# Patient Record
Sex: Female | Born: 1950 | Race: White | Hispanic: No | Marital: Married | Smoking: Never smoker
Health system: Southern US, Community
[De-identification: ages and names within clinical notes are randomized; demographics above are authoritative.]

## PROBLEM LIST (undated history)

## (undated) DIAGNOSIS — I639 Cerebral infarction, unspecified: Secondary | ICD-10-CM

## (undated) DIAGNOSIS — E785 Hyperlipidemia, unspecified: Secondary | ICD-10-CM

## (undated) DIAGNOSIS — I10 Essential (primary) hypertension: Secondary | ICD-10-CM

## (undated) HISTORY — PX: BLADDER SURGERY: SHX569

## (undated) HISTORY — PX: TUBAL LIGATION: SHX77

## (undated) HISTORY — DX: Cerebral infarction, unspecified: I63.9

## (undated) HISTORY — DX: Hyperlipidemia, unspecified: E78.5

## (undated) HISTORY — PX: KNEE ARTHROSCOPY: SUR90

---

## 2005-08-01 ENCOUNTER — Encounter: Admission: RE | Admit: 2005-08-01 | Discharge: 2005-08-01 | Payer: Self-pay | Admitting: Obstetrics and Gynecology

## 2005-09-10 ENCOUNTER — Ambulatory Visit (HOSPITAL_COMMUNITY): Admission: RE | Admit: 2005-09-10 | Discharge: 2005-09-10 | Payer: Self-pay | Admitting: Specialist

## 2008-09-24 ENCOUNTER — Emergency Department (HOSPITAL_COMMUNITY): Admission: EM | Admit: 2008-09-24 | Discharge: 2008-09-25 | Payer: Self-pay | Admitting: Emergency Medicine

## 2009-01-19 ENCOUNTER — Emergency Department (HOSPITAL_COMMUNITY): Admission: EM | Admit: 2009-01-19 | Discharge: 2009-01-19 | Payer: Self-pay | Admitting: Emergency Medicine

## 2009-03-14 ENCOUNTER — Ambulatory Visit: Payer: Self-pay | Admitting: Family Medicine

## 2009-03-15 DIAGNOSIS — R32 Unspecified urinary incontinence: Secondary | ICD-10-CM

## 2009-03-15 DIAGNOSIS — I10 Essential (primary) hypertension: Secondary | ICD-10-CM | POA: Insufficient documentation

## 2009-03-15 DIAGNOSIS — J309 Allergic rhinitis, unspecified: Secondary | ICD-10-CM | POA: Insufficient documentation

## 2009-04-25 ENCOUNTER — Ambulatory Visit: Payer: Self-pay | Admitting: Family Medicine

## 2009-04-25 DIAGNOSIS — R5381 Other malaise: Secondary | ICD-10-CM

## 2009-04-25 DIAGNOSIS — R5383 Other fatigue: Secondary | ICD-10-CM

## 2009-04-25 LAB — CONVERTED CEMR LAB
ALT: 23 units/L (ref 0–35)
AST: 24 units/L (ref 0–37)
Albumin: 4.2 g/dL (ref 3.5–5.2)
Alkaline Phosphatase: 68 units/L (ref 39–117)
BUN: 10 mg/dL (ref 6–23)
Basophils Absolute: 0.1 10*3/uL (ref 0.0–0.1)
Basophils Relative: 1.2 % (ref 0.0–3.0)
Bilirubin, Direct: 0.1 mg/dL (ref 0.0–0.3)
CO2: 27 meq/L (ref 19–32)
Calcium: 9.6 mg/dL (ref 8.4–10.5)
Chloride: 108 meq/L (ref 96–112)
Cholesterol: 219 mg/dL — ABNORMAL HIGH (ref 0–200)
Creatinine, Ser: 0.6 mg/dL (ref 0.4–1.2)
Direct LDL: 165.2 mg/dL
Eosinophils Absolute: 0.1 10*3/uL (ref 0.0–0.7)
Eosinophils Relative: 2 % (ref 0.0–5.0)
GFR calc non Af Amer: 109.17 mL/min (ref >60)
Glucose, Bld: 114 mg/dL — ABNORMAL HIGH (ref 70–99)
HCT: 38.9 % (ref 36.0–46.0)
HDL: 35.4 mg/dL — ABNORMAL LOW (ref >39.00)
Hemoglobin: 13.5 g/dL (ref 12.0–15.0)
Lymphocytes Relative: 36.5 % (ref 12.0–46.0)
Lymphs Abs: 1.9 10*3/uL (ref 0.7–4.0)
MCHC: 34.7 g/dL (ref 30.0–36.0)
MCV: 90 fL (ref 78.0–100.0)
Monocytes Absolute: 0.4 10*3/uL (ref 0.1–1.0)
Monocytes Relative: 7.9 % (ref 3.0–12.0)
Neutro Abs: 2.6 10*3/uL (ref 1.4–7.7)
Neutrophils Relative %: 52.4 % (ref 43.0–77.0)
Platelets: 167 10*3/uL (ref 150.0–400.0)
Potassium: 4.3 meq/L (ref 3.5–5.1)
RBC: 4.33 M/uL (ref 3.87–5.11)
RDW: 12.6 % (ref 11.5–14.6)
Sodium: 142 meq/L (ref 135–145)
TSH: 1.05 microintl units/mL (ref 0.35–5.50)
Total Bilirubin: 1.1 mg/dL (ref 0.3–1.2)
Total CHOL/HDL Ratio: 6
Total Protein: 7.4 g/dL (ref 6.0–8.3)
Triglycerides: 109 mg/dL (ref 0.0–149.0)
VLDL: 21.8 mg/dL (ref 0.0–40.0)
WBC: 5.1 10*3/uL (ref 4.5–10.5)

## 2009-04-28 ENCOUNTER — Ambulatory Visit: Payer: Self-pay | Admitting: Family Medicine

## 2009-04-28 LAB — CONVERTED CEMR LAB
Cholesterol, target level: 200 mg/dL
HDL goal, serum: 40 mg/dL
LDL Goal: 130 mg/dL

## 2009-04-30 DIAGNOSIS — E785 Hyperlipidemia, unspecified: Secondary | ICD-10-CM

## 2009-09-13 ENCOUNTER — Encounter: Admission: RE | Admit: 2009-09-13 | Discharge: 2009-09-13 | Payer: Self-pay | Admitting: Obstetrics and Gynecology

## 2010-04-10 ENCOUNTER — Emergency Department (HOSPITAL_COMMUNITY): Admission: EM | Admit: 2010-04-10 | Discharge: 2010-04-10 | Payer: Self-pay | Admitting: Family Medicine

## 2010-09-24 ENCOUNTER — Ambulatory Visit: Payer: Self-pay | Admitting: Family Medicine

## 2010-09-24 DIAGNOSIS — M629 Disorder of muscle, unspecified: Secondary | ICD-10-CM

## 2010-12-18 ENCOUNTER — Encounter: Payer: Self-pay | Admitting: Family Medicine

## 2010-12-25 NOTE — Assessment & Plan Note (Signed)
Summary: F/U BP,DISCUSS MEDICATION/CLE   Vital Signs:  Patient profile:   60 year old female Height:      65.5 inches Weight:      182.0 pounds BMI:     29.93 Temp:     98.5 degrees F oral Pulse rate:   76 / minute Pulse rhythm:   regular BP sitting:   160 / 92  (left arm) Cuff size:   large  Vitals Entered By: Benny Lennert CMA Duncan Dull) (September 24, 2010 4:13 PM)  History of Present Illness: Chief complaint follow up and discuss bp meds  60 year old female:  Off and on since May, no BP meds since October.  HTN: unstable, 160-170/90's and not compliant, could not tolerate Norvasc. Had swelling and some headaches.  L ITBS: lateral pain, on mobic, voltaren gel, core stability and stretching  ROS: no cp, sob, fever, chills  GEN: WDWN, NAD, Non-toxic, A & O x 3 HEENT: Atraumatic, Normocephalic. Neck supple. No masses, No LAD. Ears and Nose: No external deformity. CV: RRR, No M/G/R. No JVD. No thrill. No extra heart sounds. PULM: CTA B, no wheezes, crackles, rhonchi. No retractions. No resp. distress. No accessory muscle use. Knee: L, TTP directly at ITB EXTR: No c/c/e NEURO: Normal gait.  PSYCH: Normally interactive. Conversant. Not depressed or anxious appearing.  Calm demeanor.    Allergies: 1)  ! Codeine 2)  ! Morphine  Past History:  Past medical, surgical, family and social histories (including risk factors) reviewed, and no changes noted (except as noted below).  Past Medical History: Reviewed history from 04/28/2009 and no changes required. Allergic rhinitis Headache, Migraine Hypertension Urinary incontinence, none s/p bladder tack Hyperlipidemia  Past Surgical History: Reviewed history from 03/14/2009 and no changes required. Tubal ligation, 1988 Bladder Tack, 1992 Tonsils and Adenoids, 1968  Family History: Reviewed history from 03/14/2009 and no changes required. Alcoholism, Drug Addiction: 0 Colon CA: 0 Ovarian/Uterine CA: 0 Breast CA:  0 Lung CA: 0 Prostate CA: 0 CAD: Mother d/c at 42, MI CVA: Father Sudden death < 50: 0 DM: Parent Mental Illness: 0  HTN +  Social History: Reviewed history from 03/14/2009 and no changes required. Neurosurgeon, [facility] Never Smoked Alcohol use-no Drug use-no Regular exercise-no   Impression & Recommendations:  Problem # 1:  HYPERTENSION (ICD-401.9) Assessment Deteriorated titrate up htn meds diuretic and ace  The following medications were removed from the medication list:    Norvasc 10 Mg Tabs (Amlodipine besylate) .Marland Kitchen... 1 by mouth daily Her updated medication list for this problem includes:    Lisinopril-hydrochlorothiazide 10-12.5 Mg Tabs (Lisinopril-hydrochlorothiazide) .Marland Kitchen... 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 Take 1 tablet by mouth once a day 2)  Viactiv Multi-vitamin Chew (Multiple vitamins-calcium) .... Takes 2 per day 3)  Aspirin 81 Mg Tbec (Aspirin) .... One by mouth every day 4)  Lisinopril-hydrochlorothiazide 10-12.5 Mg Tabs (Lisinopril-hydrochlorothiazide) .Marland Kitchen.. 1 by mouth daily  Patient Instructions: 1)  f/u 1 month Prescriptions: LISINOPRIL-HYDROCHLOROTHIAZIDE 10-12.5 MG TABS (LISINOPRIL-HYDROCHLOROTHIAZIDE) 1 by mouth daily  #30 x 2   Entered and Authorized by:   Hannah Beat MD   Signed by:   Hannah Beat MD on 09/24/2010   Method used:   Electronically to        [facility]* (retail)  [address]       [address] Shipping/mailing       [place], [place]  [address]       Ph: [phone]       Fax: [phone]   RxID:   [id number]    Orders Added: 1)  Est. Patient Level IV [25366]    Current Allergies (reviewed today): ! CODEINE ! MORPHINE

## 2011-01-02 NOTE — Medication Information (Signed)
Summary: [facility]  [facility]   Imported By: Kassie Mends 12/28/2010 09:59:47  _____________________________________________________________________  External Attachment:    Type:   Image     Comment:   External Document

## 2011-03-04 ENCOUNTER — Other Ambulatory Visit: Payer: Self-pay | Admitting: Obstetrics and Gynecology

## 2011-03-04 DIAGNOSIS — Z1231 Encounter for screening mammogram for malignant neoplasm of breast: Secondary | ICD-10-CM

## 2011-03-11 ENCOUNTER — Ambulatory Visit
Admission: RE | Admit: 2011-03-11 | Discharge: 2011-03-11 | Disposition: A | Discharge status: Home / Self Care | Payer: 59 | Source: Ambulatory Visit | Attending: Obstetrics and Gynecology | Admitting: Obstetrics and Gynecology

## 2011-03-11 DIAGNOSIS — Z1231 Encounter for screening mammogram for malignant neoplasm of breast: Secondary | ICD-10-CM

## 2011-04-09 ENCOUNTER — Other Ambulatory Visit: Payer: Self-pay | Admitting: Family Medicine

## 2011-06-03 ENCOUNTER — Inpatient Hospital Stay (INDEPENDENT_AMBULATORY_CARE_PROVIDER_SITE_OTHER)
Admission: RE | Admit: 2011-06-03 | Discharge: 2011-06-03 | Disposition: A | Discharge status: Home / Self Care | Payer: 59 | Source: Ambulatory Visit | Attending: Family Medicine | Admitting: Family Medicine

## 2011-06-03 DIAGNOSIS — N390 Urinary tract infection, site not specified: Secondary | ICD-10-CM

## 2011-06-03 LAB — POCT URINALYSIS DIP (DEVICE)
Bilirubin Urine: NEGATIVE
Glucose, UA: NEGATIVE mg/dL
Ketones, ur: NEGATIVE mg/dL
Nitrite: POSITIVE — AB
Protein, ur: 100 mg/dL — AB
Specific Gravity, Urine: >=1.03 (ref 1.005–1.030)
Urobilinogen, UA: 0.2 mg/dL (ref 0.0–1.0)
pH: 5.5 (ref 5.0–8.0)

## 2011-07-05 ENCOUNTER — Other Ambulatory Visit: Payer: Self-pay | Admitting: Family Medicine

## 2011-09-02 ENCOUNTER — Other Ambulatory Visit (HOSPITAL_COMMUNITY): Payer: Self-pay | Admitting: Sports Medicine

## 2011-09-02 DIAGNOSIS — M25569 Pain in unspecified knee: Secondary | ICD-10-CM

## 2011-09-08 ENCOUNTER — Ambulatory Visit (HOSPITAL_COMMUNITY)
Admission: RE | Admit: 2011-09-08 | Discharge: 2011-09-08 | Disposition: A | Discharge status: Home / Self Care | Payer: 59 | Source: Ambulatory Visit | Attending: Sports Medicine | Admitting: Sports Medicine

## 2011-09-08 DIAGNOSIS — M7120 Synovial cyst of popliteal space [Baker], unspecified knee: Secondary | ICD-10-CM | POA: Insufficient documentation

## 2011-09-08 DIAGNOSIS — M25569 Pain in unspecified knee: Secondary | ICD-10-CM | POA: Insufficient documentation

## 2011-09-08 DIAGNOSIS — M25469 Effusion, unspecified knee: Secondary | ICD-10-CM | POA: Insufficient documentation

## 2011-09-08 DIAGNOSIS — M23329 Other meniscus derangements, posterior horn of medial meniscus, unspecified knee: Secondary | ICD-10-CM | POA: Insufficient documentation

## 2011-09-16 ENCOUNTER — Other Ambulatory Visit: Payer: Self-pay | Admitting: *Deleted

## 2011-09-16 MED ORDER — LISINOPRIL-HYDROCHLOROTHIAZIDE 10-12.5 MG PO TABS: 1.0000 | ORAL_TABLET | Freq: Every day | ORAL | Status: DC

## 2011-09-19 ENCOUNTER — Ambulatory Visit (HOSPITAL_BASED_OUTPATIENT_CLINIC_OR_DEPARTMENT_OTHER)
Admission: RE | Admit: 2011-09-19 | Discharge: 2011-09-19 | Disposition: A | Discharge status: Home / Self Care | Payer: 59 | Source: Ambulatory Visit | Attending: Orthopedic Surgery | Admitting: Orthopedic Surgery

## 2011-09-19 DIAGNOSIS — Z01812 Encounter for preprocedural laboratory examination: Secondary | ICD-10-CM | POA: Insufficient documentation

## 2011-09-19 DIAGNOSIS — K219 Gastro-esophageal reflux disease without esophagitis: Secondary | ICD-10-CM | POA: Insufficient documentation

## 2011-09-19 DIAGNOSIS — X58XXXA Exposure to other specified factors, initial encounter: Secondary | ICD-10-CM | POA: Insufficient documentation

## 2011-09-19 DIAGNOSIS — IMO0002 Reserved for concepts with insufficient information to code with codable children: Secondary | ICD-10-CM | POA: Insufficient documentation

## 2011-09-19 DIAGNOSIS — M2240 Chondromalacia patellae, unspecified knee: Secondary | ICD-10-CM | POA: Insufficient documentation

## 2011-09-19 DIAGNOSIS — M6580 Other synovitis and tenosynovitis, unspecified site: Secondary | ICD-10-CM | POA: Insufficient documentation

## 2011-09-19 DIAGNOSIS — I10 Essential (primary) hypertension: Secondary | ICD-10-CM | POA: Insufficient documentation

## 2011-09-19 DIAGNOSIS — Z79899 Other long term (current) drug therapy: Secondary | ICD-10-CM | POA: Insufficient documentation

## 2011-09-19 LAB — POCT I-STAT 4, (NA,K, GLUC, HGB,HCT)
Glucose, Bld: 109 mg/dL — ABNORMAL HIGH (ref 70–99)
Glucose, Bld: 119 mg/dL — ABNORMAL HIGH (ref 70–99)
HCT: 25 % — ABNORMAL LOW (ref 36.0–46.0)
Hemoglobin: 8.5 g/dL — ABNORMAL LOW (ref 12.0–15.0)
Hemoglobin: 13.3 g/dL (ref 12.0–15.0)
Potassium: 3.7 mEq/L (ref 3.5–5.1)
Potassium: 3.6 mEq/L (ref 3.5–5.1)
Sodium: 141 mEq/L (ref 135–145)

## 2011-09-23 NOTE — Op Note (Addendum)
NAMECATHRYNE, Veronica Andrade              ACCOUNT NO.:  [id number]  MEDICAL RECORD NO.:  [id number]  LOCATION:  [facility]                         FACILITY:  [facility]  PHYSICIAN:  Madlyn Frankel. Charlann Boxer, M.D.  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  POSTOPERATIVE DIAGNOSIS/FINDINGS: 1. 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  The patella     had some grade 4 changes.  The trochlea had a large chondral flap     and the central area representing large fissure with a loose     unstable fragment about 1-2 cm. 2. 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 3. Medial meniscal tear posterior horn radial tear as described by     magnetic resonance imaging. 4. Synovitis with plica medially.  PROCEDURE: 1. Left knee diagnostic and operative arthroscopy with a partial     medial meniscectomy. 2. Medial chondroplasty with debridement of chondral flaps. 3. Patellofemoral chondroplasty with debridement of chondral flaps,     particularly involving the trochlear with this large 2 cm chondral     flap. 4. Synovitis with plica excision medially.  SURGEON:  Madlyn Frankel. Charlann Boxer, M.D.  ASSISTANT:  Surgical team.  ANESTHESIA:  General.  SPECIMENS:  None.  COMPLICATION:  None.  DRAINS:  None.  INDICATION:  Veronica Andrade is a 60 year old female who had presented to the office for evaluation of recurrent left knee pain with mechanical symptoms, recurrent swelling.  She had failed conservative measures and subsequent MRI ordered revealed medial meniscal tear as well as the patellofemoral joint to pathology.  After reviewing with her the failing to conservative measures, she wished to proceed more definitive measures trying to get some relief from her knee symptoms.  Risks of  potential progression of arthritis based on findings intraoperatively as well as infection, DVT were discussed, reviewed.  Consent was obtained for benefit of pain relief.  PROCEDURE IN DETAIL:  The patient was brought to the operative theater. Once adequate anesthesia, preoperative antibiotics, Ancef administered, she was positioned supine with the left leg in a leg holder.  Left lower extremity was then prepped and draped in a sterile fashion.  A time-out was performed identifying the patient, planned procedure, and extremity.  Standard inferomedial, superomedial, and inferolateral portals were utilized.  Diagnostic evaluation of knee revealed the above findings. Inferomedial portal was utilized as the sole working portal.  Attention was first directed to the medial compartment.  I used a combination of straight biting basket to first debride the radial tearing posterior horn medial meniscus and then the 3.5 Cuda shaver was introduced to remove the fragments of cartilage as well as the contour, the remaining cartilage back to a stable level.  Probe examination revealed a stable posterior horn medial meniscus.  Further chondroplasty along the medial femoral condyle mainly in the lateral joint region was debrided back to a stable level.  There was no evidence either noted bone medially.  As I went more to the distal anterior aspect of medial femoral condyle, there was  noted to be some chondral degradation as a result of the large synovial band that was noted.  This was stabilized as well.  Medial synovectomy was carried out all the way down to free up the gutter significantly.  In the anterior compartment, chondroplasty was carried out predominantly over the trochlea where this large chondral defect was noted as noted in the findings.  This large fragment was removed this point down to but did not have significant exposed bones.  This represented a large fissure defect.  The  patella itself had some eburnated bone predominantly over the superior lateral portal facet region.  Minor chondroplasty carried out in this area that was stabilized remaining fragments in this area.  The remainder of her knee was relatively intact with an intact ACL, lateral compartment intact.  Following these above-stated procedures the knee was reexamined to make certain that there was no loose fragments of cartilage floating in the knee.  The instrumentation was removed.  Portal sites reapproximated using 4-0 nylon.  The knee was injected at this point with 0.25% Marcaine with epinephrine, 25 mL.  The knee was wrapped into a sterile bulky Jones dressing.  She was then brought to recovery room in stable condition tolerating the procedure well.     Madlyn Frankel Charlann Boxer, M.D.     MDO/MEDQ  D:  09/19/2011  T:  09/19/2011  Job:  [id number]  Electronically Signed by Durene Romans M.D. on 09/23/2011 09:15:10 AM

## 2011-12-18 ENCOUNTER — Other Ambulatory Visit: Payer: Self-pay | Admitting: Family Medicine

## 2011-12-18 NOTE — Telephone Encounter (Signed)
Ok to refill 30,1 refill  Needs f/u

## 2012-01-02 ENCOUNTER — Emergency Department (INDEPENDENT_AMBULATORY_CARE_PROVIDER_SITE_OTHER)
Admission: EM | Admit: 2012-01-02 | Discharge: 2012-01-02 | Disposition: A | Discharge status: Home / Self Care | Payer: 59 | Source: Home / Self Care

## 2012-01-02 ENCOUNTER — Encounter (HOSPITAL_COMMUNITY): Payer: Self-pay | Admitting: *Deleted

## 2012-01-02 DIAGNOSIS — N390 Urinary tract infection, site not specified: Secondary | ICD-10-CM

## 2012-01-02 LAB — POCT URINALYSIS DIP (DEVICE)
Bilirubin Urine: NEGATIVE
Glucose, UA: NEGATIVE mg/dL
Ketones, ur: NEGATIVE mg/dL
Nitrite: POSITIVE — AB
Protein, ur: NEGATIVE mg/dL
Specific Gravity, Urine: 1.01 (ref 1.005–1.030)
Urobilinogen, UA: 0.2 mg/dL (ref 0.0–1.0)
pH: 5.5 (ref 5.0–8.0)

## 2012-01-02 MED ORDER — PHENAZOPYRIDINE HCL 200 MG PO TABS: 200.0000 mg | ORAL_TABLET | Freq: Three times a day (TID) | ORAL | Status: AC

## 2012-01-02 MED ORDER — CEPHALEXIN 500 MG PO CAPS: ORAL_CAPSULE | ORAL | Status: DC

## 2012-01-02 NOTE — ED Provider Notes (Signed)
History     CSN: [id number]  Arrival date & time 01/02/12  1335   None     Chief Complaint  Patient presents with  . Urinary Tract Infection    (Consider location/radiation/quality/duration/timing/severity/associated sxs/prior treatment) HPI Comments: Urinary frequency and voiding small amts for the past 3=4 days. Has been taking azo and cranberry juice. Hx of similar episode in November. No fever. No back pain. No n/v.   The history is provided by the patient.    History reviewed. No pertinent past medical history.  History reviewed. No pertinent past surgical history.  History reviewed. No pertinent family history.  History  Substance Use Topics  . Smoking status: Not on file  . Smokeless tobacco: Not on file  . Alcohol Use: Not on file    OB History    Grav Para Term Preterm Abortions TAB SAB Ect Mult Living                  Review of Systems  HENT: Negative.   Respiratory: Negative.   Cardiovascular: Negative.   Gastrointestinal: Negative.   Musculoskeletal: Negative.     Allergies  Codeine and Morphine  Home Medications   Current Outpatient Rx  Name Route Sig Dispense Refill  . CEPHALEXIN 500 MG PO CAPS  1 cap bid 14 capsule 0  . LISINOPRIL-HYDROCHLOROTHIAZIDE 10-12.5 MG PO TABS  TAKE 1 TABLET BY MOUTH DAILY. 30 tablet 1    Patient needs follow up before further refills  . PHENAZOPYRIDINE HCL 200 MG PO TABS Oral Take 1 tablet (200 mg total) by mouth 3 (three) times daily. 6 tablet 0    BP 152/96  Pulse 82  Temp(Src) 99.5 F (37.5 C) (Oral)  Resp 16  SpO2 98%  Physical Exam  Nursing note and vitals reviewed. Constitutional: She appears well-developed and well-nourished. No distress.  Cardiovascular: Normal rate and regular rhythm.   Pulmonary/Chest: Effort normal and breath sounds normal.  Abdominal: Soft. Bowel sounds are normal. There is no tenderness.  Genitourinary:       No flank tenderness    ED Course  Procedures (including  critical care time)  Labs Reviewed  POCT URINALYSIS DIP (DEVICE) - Abnormal; Notable for the following:    Hgb urine dipstick SMALL (*)    Nitrite POSITIVE (*)    Leukocytes, UA LARGE (*) Biochemical Testing Only. Please order routine urinalysis from main lab if confirmatory testing is needed.   All other components within normal limits  URINE CULTURE   No results found.   1. UTI (lower urinary tract infection)       MDM          Randa Spike, MD 01/02/12 1525

## 2012-01-02 NOTE — ED Notes (Signed)
Pt  Reports  Frequent  Scanty     Urination  With  Some mild  Discomfort  And  Sediment   As  Well

## 2012-01-04 LAB — URINE CULTURE
Colony Count: >=100000
Special Requests: NORMAL

## 2012-01-06 NOTE — ED Notes (Signed)
Urine Culture: >100,000 colonies E. Coli.  Pt. adequately treated with Keflex. Veronica Andrade 01/06/2012

## 2012-11-30 ENCOUNTER — Emergency Department (HOSPITAL_COMMUNITY)
Admission: EM | Admit: 2012-11-30 | Discharge: 2012-11-30 | Disposition: A | Discharge status: Home / Self Care | Payer: 59 | Source: Home / Self Care | Attending: Emergency Medicine | Admitting: Emergency Medicine

## 2012-11-30 ENCOUNTER — Encounter (HOSPITAL_COMMUNITY): Payer: Self-pay

## 2012-11-30 DIAGNOSIS — J069 Acute upper respiratory infection, unspecified: Secondary | ICD-10-CM

## 2012-11-30 HISTORY — DX: Essential (primary) hypertension: I10

## 2012-11-30 MED ORDER — AZITHROMYCIN 250 MG PO TABS: 250.0000 mg | ORAL_TABLET | Freq: Every day | ORAL | Status: DC

## 2012-11-30 MED ORDER — LORATADINE 10 MG PO TABS: 10.0000 mg | ORAL_TABLET | Freq: Every day | ORAL | Status: DC

## 2012-11-30 NOTE — ED Provider Notes (Signed)
History     CSN: [id number]  Arrival date & time 11/30/12  1016   First MD Initiated Contact with Patient 11/30/12 1205      Chief Complaint  Patient presents with  . Cough    (Consider location/radiation/quality/duration/timing/severity/associated sxs/prior treatment) Patient is a 62 y.o. female presenting with cough. The history is provided by the patient.  Cough This is a new problem. The current episode started more than 1 week ago. The cough is productive of sputum. There has been no fever. Associated symptoms include chest pain, rhinorrhea and sore throat. Treatments tried: tylenol, advil, decongestants. The treatment provided mild relief. She is not a smoker. Her past medical history is significant for bronchitis. Her past medical history does not include pneumonia or asthma.    Past Medical History  Diagnosis Date  . Hypertension     History reviewed. No pertinent past surgical history.  History reviewed. No pertinent family history.  History  Substance Use Topics  . Smoking status: Not on file  . Smokeless tobacco: Not on file  . Alcohol Use:     OB History    Grav Para Term Preterm Abortions TAB SAB Ect Mult Living                  Review of Systems  HENT: Positive for congestion, sore throat, rhinorrhea, sneezing and voice change.   Respiratory: Positive for cough.   Cardiovascular: Positive for chest pain.  All other systems reviewed and are negative.    Allergies  Codeine and Morphine  Home Medications   Current Outpatient Rx  Name  Route  Sig  Dispense  Refill  . CEPHALEXIN 500 MG PO CAPS      1 cap bid   14 capsule   0   . LISINOPRIL 10 MG PO TABS   Oral   Take 10 mg by mouth daily.         . AZITHROMYCIN 250 MG PO TABS   Oral   Take 1 tablet (250 mg total) by mouth daily. Take first 2 tablets together, then 1 every day until finished.   6 tablet   0   . LORATADINE 10 MG PO TABS   Oral   Take 1 tablet (10 mg total) by mouth  daily.   30 tablet   0     BP 182/98  Pulse 78  Temp 98.5 F (36.9 C) (Oral)  Resp 20  SpO2 99%  Physical Exam  Nursing note and vitals reviewed. Constitutional: She is oriented to person, place, and time. Vital signs are normal. She appears well-developed and well-nourished. She is active and cooperative.  HENT:  Head: Normocephalic.  Right Ear: Tympanic membrane and external ear normal.  Left Ear: Tympanic membrane and external ear normal.  Nose: Nose normal. Right sinus exhibits no maxillary sinus tenderness and no frontal sinus tenderness. Left sinus exhibits no maxillary sinus tenderness and no frontal sinus tenderness.  Mouth/Throat: Uvula is midline, oropharynx is clear and moist and mucous membranes are normal. No oropharyngeal exudate.  Eyes: Conjunctivae normal are normal. Pupils are equal, round, and reactive to light. No scleral icterus.  Neck: Trachea normal and normal range of motion. Neck supple.  Cardiovascular: Normal rate, regular rhythm, normal heart sounds, intact distal pulses and normal pulses.   Pulmonary/Chest: Effort normal and breath sounds normal.  Lymphadenopathy:       Head (right side): No submental, no submandibular, no tonsillar, no preauricular, no posterior auricular and no occipital  adenopathy present.       Head (left side): No submental, no submandibular, no tonsillar, no preauricular, no posterior auricular and no occipital adenopathy present.    She has no cervical adenopathy.  Neurological: She is alert and oriented to person, place, and time. No cranial nerve deficit or sensory deficit.  Skin: Skin is warm and dry.  Psychiatric: She has a normal mood and affect. Her speech is normal and behavior is normal. Judgment and thought content normal. Cognition and memory are normal.    ED Course  Procedures (including critical care time)  Labs Reviewed - No data to display No results found.   1. URI (upper respiratory infection)        MDM  Increase fluid intake, rest.  Begin Azithromycin.  Begin expectorant/decongestant, discontinue use of D products, take your blood pressure medication upon returning home.  Antihistamines of your choice (Claritin or Zyrtec).  Tylenol or Motrin for fever/discomfort.  Followup with PCP if not improving 7 to 10 days.         Johnsie Kindred, NP 11/30/12 1217

## 2012-11-30 NOTE — ED Notes (Signed)
Sick for 2 weeks; kept grandson who had bronchiolitis and RSV; barking cough w congestion

## 2012-11-30 NOTE — ED Provider Notes (Signed)
Medical screening examination/treatment/procedure(s) were performed by non-physician practitioner and as supervising physician I was immediately available for consultation/collaboration.  Aseneth Hack, M.D.   Yazan Gatling C Snigdha Howser, MD 11/30/12 2240 

## 2013-01-09 ENCOUNTER — Other Ambulatory Visit: Payer: Self-pay

## 2013-05-17 ENCOUNTER — Other Ambulatory Visit: Payer: Self-pay

## 2013-05-17 DIAGNOSIS — Z1231 Encounter for screening mammogram for malignant neoplasm of breast: Secondary | ICD-10-CM

## 2013-06-01 ENCOUNTER — Ambulatory Visit
Admission: RE | Admit: 2013-06-01 | Discharge: 2013-06-01 | Disposition: A | Discharge status: Home / Self Care | Payer: 59 | Source: Ambulatory Visit

## 2013-06-01 DIAGNOSIS — Z1231 Encounter for screening mammogram for malignant neoplasm of breast: Secondary | ICD-10-CM

## 2013-09-30 ENCOUNTER — Other Ambulatory Visit: Payer: Self-pay

## 2014-01-29 ENCOUNTER — Encounter: Payer: 59 | Attending: Family Medicine | Admitting: Dietician

## 2014-01-29 DIAGNOSIS — E663 Overweight: Secondary | ICD-10-CM

## 2014-01-29 DIAGNOSIS — E119 Type 2 diabetes mellitus without complications: Secondary | ICD-10-CM | POA: Insufficient documentation

## 2014-01-29 DIAGNOSIS — Z713 Dietary counseling and surveillance: Secondary | ICD-10-CM | POA: Insufficient documentation

## 2014-01-31 ENCOUNTER — Encounter: Payer: Self-pay | Admitting: Dietician

## 2014-01-31 NOTE — Patient Instructions (Signed)
Patient to set specific, measurable, attainable, realistic, and time-oriented goals.  

## 2014-01-31 NOTE — Progress Notes (Signed)
Weight Management Class:  Appt start time: 1000   End time:  1100.  Patient was seen on 01/29/2014 for Weight Management Education at the [facility].   Weight today: unknown   The following the learning objectives were met by the patient during this course:  Identify healthy eating/lifestyle behaviors for weight loss  Be familiar with the different food groups and appropriate serving sizes  Learn healthy meal planning  State the appropriate amount of weight to lose weekly  Identify 1-2 goals to begin to lose weight   Follow-Up Plan: Patient to set specific, measurable, attainable, realistic, and time-oriented goals for weight loss.  

## 2014-05-17 ENCOUNTER — Other Ambulatory Visit: Payer: Self-pay

## 2014-05-17 DIAGNOSIS — Z1231 Encounter for screening mammogram for malignant neoplasm of breast: Secondary | ICD-10-CM

## 2014-06-28 ENCOUNTER — Ambulatory Visit
Admission: RE | Admit: 2014-06-28 | Discharge: 2014-06-28 | Disposition: A | Discharge status: Home / Self Care | Payer: 59 | Source: Ambulatory Visit

## 2014-06-28 DIAGNOSIS — Z1231 Encounter for screening mammogram for malignant neoplasm of breast: Secondary | ICD-10-CM

## 2014-06-29 ENCOUNTER — Ambulatory Visit: Payer: 59 | Admitting: Family Medicine

## 2014-10-03 ENCOUNTER — Ambulatory Visit: Payer: 59 | Admitting: Family Medicine

## 2015-06-07 ENCOUNTER — Other Ambulatory Visit: Payer: Self-pay

## 2015-06-07 DIAGNOSIS — Z1231 Encounter for screening mammogram for malignant neoplasm of breast: Secondary | ICD-10-CM

## 2015-07-03 ENCOUNTER — Ambulatory Visit: Payer: 59

## 2015-07-21 ENCOUNTER — Ambulatory Visit
Admission: RE | Admit: 2015-07-21 | Discharge: 2015-07-21 | Disposition: A | Discharge status: Home / Self Care | Payer: 59 | Source: Ambulatory Visit

## 2015-07-21 DIAGNOSIS — Z1231 Encounter for screening mammogram for malignant neoplasm of breast: Secondary | ICD-10-CM

## 2015-11-24 ENCOUNTER — Inpatient Hospital Stay (HOSPITAL_COMMUNITY)
Admission: EM | Admit: 2015-11-24 | Discharge: 2015-11-26 | DRG: 066 | Disposition: A | Discharge status: Home / Self Care | Payer: 59 | Attending: Internal Medicine | Admitting: Internal Medicine

## 2015-11-24 ENCOUNTER — Emergency Department (HOSPITAL_COMMUNITY): Payer: 59

## 2015-11-24 ENCOUNTER — Encounter (HOSPITAL_COMMUNITY): Payer: Self-pay | Admitting: Internal Medicine

## 2015-11-24 DIAGNOSIS — Z8673 Personal history of transient ischemic attack (TIA), and cerebral infarction without residual deficits: Secondary | ICD-10-CM | POA: Diagnosis present

## 2015-11-24 DIAGNOSIS — Z885 Allergy status to narcotic agent status: Secondary | ICD-10-CM | POA: Diagnosis not present

## 2015-11-24 DIAGNOSIS — I639 Cerebral infarction, unspecified: Secondary | ICD-10-CM | POA: Diagnosis not present

## 2015-11-24 DIAGNOSIS — I6789 Other cerebrovascular disease: Secondary | ICD-10-CM | POA: Diagnosis not present

## 2015-11-24 DIAGNOSIS — I10 Essential (primary) hypertension: Secondary | ICD-10-CM | POA: Diagnosis not present

## 2015-11-24 DIAGNOSIS — Z79899 Other long term (current) drug therapy: Secondary | ICD-10-CM

## 2015-11-24 DIAGNOSIS — Z7982 Long term (current) use of aspirin: Secondary | ICD-10-CM

## 2015-11-24 DIAGNOSIS — E785 Hyperlipidemia, unspecified: Secondary | ICD-10-CM | POA: Diagnosis present

## 2015-11-24 DIAGNOSIS — Z9119 Patient's noncompliance with other medical treatment and regimen: Secondary | ICD-10-CM | POA: Diagnosis not present

## 2015-11-24 DIAGNOSIS — I63231 Cerebral infarction due to unspecified occlusion or stenosis of right carotid arteries: Secondary | ICD-10-CM | POA: Diagnosis not present

## 2015-11-24 DIAGNOSIS — J309 Allergic rhinitis, unspecified: Secondary | ICD-10-CM | POA: Diagnosis present

## 2015-11-24 DIAGNOSIS — I6359 Cerebral infarction due to unspecified occlusion or stenosis of other cerebral artery: Secondary | ICD-10-CM | POA: Diagnosis not present

## 2015-11-24 DIAGNOSIS — I63312 Cerebral infarction due to thrombosis of left middle cerebral artery: Secondary | ICD-10-CM | POA: Diagnosis not present

## 2015-11-24 DIAGNOSIS — I63232 Cerebral infarction due to unspecified occlusion or stenosis of left carotid arteries: Secondary | ICD-10-CM | POA: Diagnosis not present

## 2015-11-24 DIAGNOSIS — I159 Secondary hypertension, unspecified: Secondary | ICD-10-CM

## 2015-11-24 LAB — CBC
HCT: 40.5 % (ref 36.0–46.0)
HEMOGLOBIN: 13.4 g/dL (ref 12.0–15.0)
MCH: 29.5 pg (ref 26.0–34.0)
MCHC: 33.1 g/dL (ref 30.0–36.0)
MCV: 89.2 fL (ref 78.0–100.0)
Platelets: 186 10*3/uL (ref 150–400)
RBC: 4.54 MIL/uL (ref 3.87–5.11)
RDW: 13.2 % (ref 11.5–15.5)
WBC: 7.5 10*3/uL (ref 4.0–10.5)

## 2015-11-24 LAB — I-STAT CHEM 8, ED
BUN: 7 mg/dL (ref 6–20)
CALCIUM ION: 1.08 mmol/L — AB (ref 1.13–1.30)
CHLORIDE: 103 mmol/L (ref 101–111)
Creatinine, Ser: 0.7 mg/dL (ref 0.44–1.00)
GLUCOSE: 131 mg/dL — AB (ref 65–99)
HCT: 46 % (ref 36.0–46.0)
Hemoglobin: 15.6 g/dL — ABNORMAL HIGH (ref 12.0–15.0)
Potassium: 3.4 mmol/L — ABNORMAL LOW (ref 3.5–5.1)
Sodium: 143 mmol/L (ref 135–145)
TCO2: 24 mmol/L (ref 0–100)

## 2015-11-24 LAB — COMPREHENSIVE METABOLIC PANEL
ALK PHOS: 91 U/L (ref 38–126)
ALT: 33 U/L (ref 14–54)
ANION GAP: 13 (ref 5–15)
AST: 35 U/L (ref 15–41)
Albumin: 4.2 g/dL (ref 3.5–5.0)
BILIRUBIN TOTAL: 0.6 mg/dL (ref 0.3–1.2)
BUN: 6 mg/dL (ref 6–20)
CALCIUM: 9.2 mg/dL (ref 8.9–10.3)
CO2: 23 mmol/L (ref 22–32)
Chloride: 105 mmol/L (ref 101–111)
Creatinine, Ser: 0.83 mg/dL (ref 0.44–1.00)
GFR calc non Af Amer: >60 mL/min (ref >60)
Glucose, Bld: 136 mg/dL — ABNORMAL HIGH (ref 65–99)
Potassium: 3.6 mmol/L (ref 3.5–5.1)
Sodium: 141 mmol/L (ref 135–145)
TOTAL PROTEIN: 7.1 g/dL (ref 6.5–8.1)

## 2015-11-24 LAB — PROTIME-INR
INR: 1.03 (ref 0.00–1.49)
PROTHROMBIN TIME: 13.7 s (ref 11.6–15.2)

## 2015-11-24 LAB — DIFFERENTIAL
Basophils Absolute: 0 10*3/uL (ref 0.0–0.1)
Basophils Relative: 0 %
EOS PCT: 2 %
Eosinophils Absolute: 0.1 10*3/uL (ref 0.0–0.7)
LYMPHS ABS: 3.1 10*3/uL (ref 0.7–4.0)
LYMPHS PCT: 41 %
MONO ABS: 0.6 10*3/uL (ref 0.1–1.0)
Monocytes Relative: 8 %
Neutro Abs: 3.7 10*3/uL (ref 1.7–7.7)
Neutrophils Relative %: 49 %

## 2015-11-24 LAB — APTT: aPTT: 28 seconds (ref 24–37)

## 2015-11-24 LAB — I-STAT TROPONIN, ED: Troponin i, poc: 0 ng/mL (ref 0.00–0.08)

## 2015-11-24 LAB — CBG MONITORING, ED: GLUCOSE-CAPILLARY: 148 mg/dL — AB (ref 65–99)

## 2015-11-24 LAB — TROPONIN I

## 2015-11-24 MED ORDER — ASPIRIN 325 MG PO TABS
325.0000 mg | ORAL_TABLET | Freq: Every day | ORAL | Status: DC
  Administered 2015-11-24 – 2015-11-26 (×3): 325 mg via ORAL
  Filled 2015-11-24 (×3): qty 1

## 2015-11-24 MED ORDER — ADULT MULTIVITAMIN W/MINERALS CH
2.0000 | ORAL_TABLET | Freq: Every day | ORAL | Status: DC
  Administered 2015-11-25 – 2015-11-26 (×2): 2 via ORAL
  Filled 2015-11-24 (×2): qty 2

## 2015-11-24 MED ORDER — VITAMIN B-12 100 MCG PO TABS
100.0000 ug | ORAL_TABLET | Freq: Every day | ORAL | Status: DC
  Administered 2015-11-25 – 2015-11-26 (×2): 100 ug via ORAL
  Filled 2015-11-24 (×2): qty 1

## 2015-11-24 MED ORDER — HEPARIN SODIUM (PORCINE) 5000 UNIT/ML IJ SOLN
5000.0000 [IU] | Freq: Three times a day (TID) | INTRAMUSCULAR | Status: DC
  Administered 2015-11-24 – 2015-11-26 (×5): 5000 [IU] via SUBCUTANEOUS
  Filled 2015-11-24 (×5): qty 1

## 2015-11-24 MED ORDER — ASPIRIN 300 MG RE SUPP: 300.0000 mg | Freq: Every day | RECTAL | Status: DC

## 2015-11-24 MED ORDER — STROKE: EARLY STAGES OF RECOVERY BOOK
Freq: Once | Status: AC
  Administered 2015-11-24: 1
  Filled 2015-11-24 (×2): qty 1

## 2015-11-24 MED ORDER — ATORVASTATIN CALCIUM 40 MG PO TABS
40.0000 mg | ORAL_TABLET | Freq: Every day | ORAL | Status: DC
  Administered 2015-11-24 – 2015-11-26 (×3): 40 mg via ORAL
  Filled 2015-11-24 (×3): qty 1

## 2015-11-24 MED ORDER — SENNOSIDES-DOCUSATE SODIUM 8.6-50 MG PO TABS: 1.0000 | ORAL_TABLET | Freq: Every evening | ORAL | Status: DC | PRN

## 2015-11-24 MED ORDER — VITAMIN D 1000 UNITS PO TABS
1000.0000 [IU] | ORAL_TABLET | Freq: Every day | ORAL | Status: DC
  Administered 2015-11-24 – 2015-11-26 (×3): 1000 [IU] via ORAL
  Filled 2015-11-24 (×3): qty 1

## 2015-11-24 MED ORDER — HYDRALAZINE HCL 20 MG/ML IJ SOLN: 5.0000 mg | INTRAMUSCULAR | Status: DC | PRN

## 2015-11-24 NOTE — Consult Note (Addendum)
Neurology Consultation Reason for Consult: Stroke Referring Physician: Effie Shy, E  CC: Stroke  History is obtained from: Patient, husband  HPI: Veronica Andrade is a 64 y.o. female with a history of hypertension who presents with sudden onset right-sided numbness and mild right arm weakness that started at approximate 6:20 PM. She states that she was out and about with her grandkids, and then noticed that her right side felt tingly. They took her grandkids home and then came straight here where a code stroke was activated. In the ER she had a CT scan which was negative for acute stroke.  Her NIH stroke scale was 2 and due to the fact that her symptoms were relatively mild TPA was not offered.   LKW: 6:20 PM tpa given?: no, mild symptoms    ROS: A 14 point ROS was performed and is negative except as noted in the HPI.   Past Medical History  Diagnosis Date  . Hypertension      Family History  Problem Relation Age of Onset  . Hypertension Other      Social History:  has no tobacco, alcohol, and drug history on file.   Exam: Current vital signs: Filed Vitals:   11/24/15 2013 11/24/15 2019  BP:    Pulse: 78   Temp:  98.5 F (36.9 C)  Resp: 18     Vital signs in last 24 hours:    Physical Exam  Constitutional: Appears well-developed and well-nourished.  Psych: Affect appropriate to situation Eyes: No scleral injection HENT: No OP obstrucion Head: Normocephalic.  Cardiovascular: Normal rate and regular rhythm.  Respiratory: Effort normal  GI: Soft.  No distension. There is no tenderness.  Skin: WDI  Neuro: Mental Status: Patient is awake, alert, oriented to person, place, month, year, and situation. Patient is able to give a clear and coherent history. No signs of aphasia or neglect Cranial Nerves: II: Visual Fields are full. Pupils are equal, round, and reactive to light.   III,IV, VI: EOMI without ptosis or diploplia.  V: Facial sensation is diminished on  the right VII: Facial movement is symmetric.  VIII: hearing is intact to voice X: Uvula elevates symmetrically XI: Shoulder shrug is symmetric. XII: tongue is midline without atrophy or fasciculations.  Motor: Tone is normal. Bulk is normal. 5/5 strength was present in bilateral legs as well as her left upper extremity. In her right upper extremity there is very mild 4+/5 weakness with a mild drift. Sensory: Sensation is decreased on the right side Deep Tendon Reflexes: 2+ and symmetric in the biceps and patellae.  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 She will need to be admitted for physical therapy as well as secondary stroke risk factor modification. Given that her symptoms are so mild, I do not think that TPA is indicated at this time.  Recommendations: 1. HgbA1c, fasting lipid panel 2. MRI  of the brain without contrast 3. Frequent neuro checks 4. Echocardiogram 5. CT angiogram of the head and neck 6. Prophylactic therapy-Antiplatelet med: Aspirin - dose  PO or  PR 7. Risk factor modification 8. Telemetry monitoring 9. PT consult,  OT consult, Speech consult 10. MRA head and carotid Dopplers will not be needed given CT angiogram   Ritta SlotMcNeill Cainan Trull, MD [facility] [phone]  If 7pm- 7am, please page neurology on call as listed in [facility]ION.

## 2015-11-24 NOTE — H&P (Signed)
[facility] History and Physical  Veronica Andrade ZOX:[id number] DOB: Feb 10, 1951 DOA: 11/24/2015  Referring physician: ED physician PCP: Ruthe Mannan, MD  Specialists:   Chief Complaint: Right-sided weakness, numbness  HPI: Veronica Andrade is a 64 y.o. female with PMH of hypertension, hyperlipidemia, med noncompliance, who presents with right-sided weakness and numbness.  Patient reports that she has sudden onset right-sided numbness and weakness that started at approximate 6:20 PM. The weakness has resolved at arrival to the emergency room, but she still has numbness in her right hands. Patient does not have a vision change, hearing loss, slurred speech. No incontinence. Patient does not have chest pain, shortness of breath, cough, abdominal pain, diarrhea, symptoms of UTI.  In ED, patient was found to have negative CT head of acute abnormalities. INR 1.03, PTT 28, negative troponin, WBC 7.5, no tachycardia, electrolytes and renal function okay. Patient's admitted to inpatient for further intervention treatment.  Where does patient live?   At home    Can patient participate in ADLs?  Yes    Review of Systems:   General: no fevers, chills, no changes in body weight, has fatigue HEENT: no blurry vision, hearing changes or sore throat Pulm: no dyspnea, coughing, wheezing CV: no chest pain, palpitations Abd: no nausea, vomiting, abdominal pain, diarrhea, constipation GU: no dysuria, burning on urination, increased urinary frequency, hematuria  Ext: no leg edema Neuro: Right-sided weakness, numbness, or tingling, no vision change or hearing loss Skin: no rash MSK: No muscle spasm, no deformity, no limitation of range of movement in spin Heme: No easy bruising.  Travel history: No recent long distant travel.  Allergy:  Allergies  Allergen Reactions  . Codeine Nausea Only and Other (See Comments)    Headaches also  . Morphine Nausea Only and Other (See Comments)    Headaches  also    Past Medical History  Diagnosis Date  . Hypertension     Past Surgical History  Procedure Laterality Date  . Knee arthroscopy    . Tubal ligation    . Bladder surgery      Social History:  reports that she has never smoked. She does not have any smokeless tobacco history on file. She reports that she does not drink alcohol or use illicit drugs.  Family History:  Family History  Problem Relation Age of Onset  . Hypertension Other      Prior to Admission medications   Medication Sig Start Date End Date Taking? Authorizing Provider  cholecalciferol (VITAMIN D) 1000 units tablet Take 1,000 Units by mouth daily.   Yes Historical Provider, MD  Cyanocobalamin (B-12 PO) Take 1 tablet by mouth daily.   Yes Historical Provider, MD  ibuprofen (ADVIL,MOTRIN) 200 MG tablet Take 400 mg by mouth every 6 (six) hours as needed for moderate pain.   Yes Historical Provider, MD  Multiple Vitamins-Minerals (MULTIVITAMIN GUMMIES ADULTS) CHEW Chew 2 each by mouth daily.   Yes Historical Provider, MD  azithromycin (ZITHROMAX) 250 MG tablet Take 1 tablet (250 mg total) by mouth daily. Take first 2 tablets together, then 1 every day until finished. 11/30/12   Johnsie Kindred, NP  cephALEXin (KEFLEX) 500 MG capsule 1 cap bid 01/02/12   Randa Spike, DO  loratadine (CLARITIN) 10 MG tablet Take 1 tablet (10 mg total) by mouth daily. 11/30/12   Johnsie Kindred, NP    Physical Exam: Filed Vitals:   11/24/15 2030 11/24/15 2049 11/24/15 2115 11/24/15 2128  BP: 192/110 186/98 169/101  185/102  Pulse: 79 77 75 82  Temp:      Resp: 22 19 18 17   SpO2: 98% 95% 97% 95%   General: Not in acute distress HEENT:       Eyes: PERRL, EOMI, no scleral icterus.       ENT: No discharge from the ears and nose, no pharynx injection, no tonsillar enlargement.        Neck: No JVD, no bruit, no mass felt. Heme: No neck lymph node enlargement. Cardiac: S1/S2, RRR, No murmurs, No gallops or rubs. Pulm: Good air  movement bilaterally. No rales, wheezing, rhonchi or rubs. Abd: Soft, nondistended, nontender, no rebound pain, no organomegaly, BS present. Ext: No pitting leg edema bilaterally. 2+DP/PT pulse bilaterally. Musculoskeletal: No joint deformities, No joint redness or warmth, no limitation of ROM in spin. Skin: No rashes.  Neuro: Alert, oriented X3, cranial nerves II-XII grossly intact, muscle strength 5/5 in all extremities, decreased sensation to light touch ion R arm and hand. Brachial reflex 2+ bilaterally. Knee reflex 1+ bilaterally. Negative Babinski's sign. Normal finger to nose test. Psych: Patient is not psychotic, no suicidal or hemocidal ideation.  Labs on Admission:  Basic Metabolic Panel:  Recent Labs Lab 11/24/15 1934 11/24/15 1935  NA 141 143  K 3.6 3.4*  CL 105 103  CO2 23  --   GLUCOSE 136* 131*  BUN 6 7  CREATININE 0.83 0.70  CALCIUM 9.2  --    Liver Function Tests:  Recent Labs Lab 11/24/15 1934  AST 35  ALT 33  ALKPHOS 91  BILITOT 0.6  PROT 7.1  ALBUMIN 4.2   No results for input(s): LIPASE, AMYLASE in the last 168 hours. No results for input(s): AMMONIA in the last 168 hours. CBC:  Recent Labs Lab 11/24/15 1934 11/24/15 1935  WBC 7.5  --   NEUTROABS 3.7  --   HGB 13.4 15.6*  HCT 40.5 46.0  MCV 89.2  --   PLT 186  --    Cardiac Enzymes: No results for input(s): CKTOTAL, CKMB, CKMBINDEX, TROPONINI in the last 168 hours.  BNP (last 3 results) No results for input(s): BNP in the last 8760 hours.  ProBNP (last 3 results) No results for input(s): PROBNP in the last 8760 hours.  CBG:  Recent Labs Lab 11/24/15 2042  GLUCAP 148*    Radiological Exams on Admission: Ct Head Wo Contrast  11/24/2015  CLINICAL DATA:  64 year old female with right-sided numbness of the face and limbs since 6:30 p.m. Code stroke. EXAM: CT HEAD WITHOUT CONTRAST TECHNIQUE: Contiguous axial images were obtained from the base of the skull through the vertex without  intravenous contrast. COMPARISON:  Head CT 09/25/2008. FINDINGS: No acute intracranial abnormalities. Specifically, no evidence of acute intracranial hemorrhage, no definite findings of acute/subacute cerebral ischemia, no mass, mass effect, hydrocephalus or abnormal intra or extra-axial fluid collections. Visualized paranasal sinuses and mastoids are well pneumatized. No acute displaced skull fractures are identified. IMPRESSION: *No acute intracranial abnormalities. *The appearance of the brain is normal. These results were called by telephone at the time of interpretation on 11/24/2015 at 7:33 pm to Dr. Amada JupiterKirkpatrick, who verbally acknowledged these results. Electronically Signed   By: Trudie Reedaniel  Entrikin M.D.   On: 11/24/2015 19:34    EKG: Independently reviewed. QTC 476, diffuse ST depression.  Assessment/Plan Principal Problem:   Stroke (cerebrum) (HCC) Active Problems:   HLD (hyperlipidemia)   Essential hypertension   Allergic rhinitis   CVA (cerebral infarction)  Stroke (cerebrum) (HCC):  Patient's symptoms are consistent with stroke/TIA. Most of symptoms have resolved. Hemodynamically stable. Neurology was consulted, Dr. Amada Jupiter evaluated the patient.  -will admit to tele bed -Appreciated neurologist consultation, with follow-up recommendations as follows:  1. HgbA1c, fasting lipid panel  2. MRI of the brain without contrast  3. Frequent neuro checks  4. Echocardiogram  5. CT angiogram of the head and neck  6. Prophylactic therapy-Antiplatelet med: Aspirin - dose  PO or  PR  7. Risk factor modification  8. Telemetry monitoring  9. PT consult, OT consult, Speech consult  10. MRA head and carotid Dopplers will not be needed given CT angiogram -will start lipitor now  HTN: not taking med at home. bp 187/99 -Allow permissive hypertension -IV hydralazine when necessary for SBP>220  HLD: Last LDL was not on record. Patient is not taking medications at  home. -Started Lipitor -Check FLP  DVT ppx: SQ Heparin   Code Status: Full code Family Communication: Yes, patient's husband and her 2 sons at bed side Disposition Plan: Admit to inpatient   Date of Service 11/24/2015    Veronica Andrade [facility] Pager [phone]  If 7PM-7AM, please contact night-coverage [email or web address] Password [email or web address] 11/24/2015, 10:10 PM

## 2015-11-24 NOTE — ED Notes (Signed)
Wentz, MD at bedside.  

## 2015-11-24 NOTE — ED Notes (Signed)
The pt has had numbness and tingling in thr rt side of her face her rt arm and her rt leg  Last seen normal at 1800.  Her symptoms started 45 minutes ago

## 2015-11-24 NOTE — ED Notes (Signed)
Attempted report 

## 2015-11-24 NOTE — ED Provider Notes (Signed)
CSN: [id number]     Arrival date & time 11/24/15  1904 History   First MD Initiated Contact with Patient 11/24/15 1934     Chief Complaint  Patient presents with  . Code Stroke    An emergency department physician performed an initial assessment on this suspected stroke patient at [id number]. (Consider location/radiation/quality/duration/timing/severity/associated sxs/prior Treatment) HPI   Veronica Andrade is a 64 y.o. female who presents for evaluation of numbness sensation and clumsy right hand, which started at 1820 this evening. Symptom occurred abruptly. It was associated with a numb sensation in her right face, and right leg as well. She was able to continue walking and did not feel uncomfortable doing that. She denies headache, neck pain, back pain, chest pain, nausea, vomiting or abdominal pain. No prior similar symptoms. There are no other known modifying factors.   Past Medical History  Diagnosis Date  . Hypertension    Past Surgical History  Procedure Laterality Date  . Knee arthroscopy    . Tubal ligation    . Bladder surgery     Family History  Problem Relation Age of Onset  . Hypertension Other    Social History  Substance Use Topics  . Smoking status: Not on file  . Smokeless tobacco: Not on file  . Alcohol Use: Not on file   OB History    No data available     Review of Systems  All other systems reviewed and are negative.     Allergies  Codeine and Morphine  Home Medications   Prior to Admission medications   Medication Sig Start Date End Date Taking? Authorizing Provider  ibuprofen (ADVIL,MOTRIN) 200 MG tablet Take 400 mg by mouth every 6 (six) hours as needed for moderate pain.   Yes Historical Provider, MD  azithromycin (ZITHROMAX) 250 MG tablet Take 1 tablet (250 mg total) by mouth daily. Take first 2 tablets together, then 1 every day until finished. 11/30/12   Johnsie Kindredarmen L Chatten, NP  cephALEXin (KEFLEX) 500 MG capsule 1 cap bid 01/02/12   Randa SpikeKimberly G  Lykins, DO  lisinopril (PRINIVIL,ZESTRIL) 10 MG tablet Take 10 mg by mouth daily.    Historical Provider, MD  loratadine (CLARITIN) 10 MG tablet Take 1 tablet (10 mg total) by mouth daily. 11/30/12   Carmen L Chatten, NP   BP 187/99 mmHg  Pulse 81  Resp 16  SpO2 97% Physical Exam  Constitutional: She is oriented to person, place, and time. She appears well-developed and well-nourished. No distress.  HENT:  Head: Normocephalic and atraumatic.  Right Ear: External ear normal.  Left Ear: External ear normal.  Eyes: Conjunctivae and EOM are normal. Pupils are equal, round, and reactive to light.  Neck: Normal range of motion and phonation normal. Neck supple.  Cardiovascular: Normal rate, regular rhythm and normal heart sounds.   Pulmonary/Chest: Effort normal and breath sounds normal. She exhibits no bony tenderness.  Abdominal: Soft. There is no tenderness.  Musculoskeletal: Normal range of motion.  Neurological: She is alert and oriented to person, place, and time. No cranial nerve deficit or sensory deficit. She exhibits normal muscle tone. Coordination normal.  No dysarthria, aphasia or nystagmus. Decreased light touch sensation of the right face, right hand and right lower leg.  Skin: Skin is warm, dry and intact.  Psychiatric: She has a normal mood and affect. Her behavior is normal. Judgment and thought content normal.  Nursing note and vitals reviewed.   ED Course  Procedures (including critical care  time)  Medications - No data to display  Patient Vitals for the past 24 hrs:  BP Pulse Resp SpO2  11/24/15 1953 - 88 15 97 %  11/24/15 1952 - 91 18 96 %  11/24/15 1936 - 81 16 97 %  11/24/15 1935 187/99 mmHg - - -    Pt presented as Code Stroke, seen shortly after arrival by Dr. Amada Jupiter who decided to not give TPA.    Consult complete with Hospitalist. Patient case explained and discussed. He agrees to admit patient for further evaluation and treatment. Call ended at  20:20  Labs Review Labs Reviewed  I-STAT CHEM 8, ED - Abnormal; Notable for the following:    Potassium 3.4 (*)    Glucose, Bld 131 (*)    Calcium, Ion 1.08 (*)    Hemoglobin 15.6 (*)    All other components within normal limits  PROTIME-INR  APTT  CBC  DIFFERENTIAL  COMPREHENSIVE METABOLIC PANEL  I-STAT TROPOININ, ED  CBG MONITORING, ED    Imaging Review Ct Head Wo Contrast  11/24/2015  CLINICAL DATA:  64 year old female with right-sided numbness of the face and limbs since 6:30 p.m. Code stroke. EXAM: CT HEAD WITHOUT CONTRAST TECHNIQUE: Contiguous axial images were obtained from the base of the skull through the vertex without intravenous contrast. COMPARISON:  Head CT 09/25/2008. FINDINGS: No acute intracranial abnormalities. Specifically, no evidence of acute intracranial hemorrhage, no definite findings of acute/subacute cerebral ischemia, no mass, mass effect, hydrocephalus or abnormal intra or extra-axial fluid collections. Visualized paranasal sinuses and mastoids are well pneumatized. No acute displaced skull fractures are identified. IMPRESSION: *No acute intracranial abnormalities. *The appearance of the brain is normal. These results were called by telephone at the time of interpretation on 11/24/2015 at 7:33 pm to Dr. Amada Jupiter, who verbally acknowledged these results. Electronically Signed   By: Trudie Reed M.D.   On: 11/24/2015 19:34   I have personally reviewed and evaluated these images and lab results as part of my medical decision-making.   EKG Interpretation   Date/Time:  Friday November 24 2015 19:37:03 EST Ventricular Rate:  82 PR Interval:  172 QRS Duration: 71 QT Interval:  408 QTC Calculation: 476 R Axis:   24 Text Interpretation:  Sinus rhythm Probable LVH with secondary repol abnrm  No old tracing to compare Confirmed by 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 Mild HTN. Doubt ACS  Nursing Notes Reviewed/ Care Coordinated Applicable Imaging Reviewed Interpretation of Laboratory Data incorporated into ED treatment  Plan: Admit    Mancel Bale, MD 11/26/15 1130

## 2015-11-25 ENCOUNTER — Inpatient Hospital Stay (HOSPITAL_COMMUNITY): Payer: 59

## 2015-11-25 DIAGNOSIS — I6789 Other cerebrovascular disease: Secondary | ICD-10-CM

## 2015-11-25 LAB — RAPID URINE DRUG SCREEN, HOSP PERFORMED
Amphetamines: NOT DETECTED
Barbiturates: NOT DETECTED
Benzodiazepines: NOT DETECTED
Cocaine: NOT DETECTED
Opiates: NOT DETECTED
Tetrahydrocannabinol: NOT DETECTED

## 2015-11-25 LAB — TROPONIN I

## 2015-11-25 LAB — LIPID PANEL
CHOL/HDL RATIO: 5.7 ratio
CHOLESTEROL: 211 mg/dL — AB (ref 0–200)
HDL: 37 mg/dL — ABNORMAL LOW (ref >40)
LDL Cholesterol: 150 mg/dL — ABNORMAL HIGH (ref 0–99)
TRIGLYCERIDES: 119 mg/dL (ref <150)
VLDL: 24 mg/dL (ref 0–40)

## 2015-11-25 MED ORDER — POTASSIUM CHLORIDE CRYS ER 20 MEQ PO TBCR
20.0000 meq | EXTENDED_RELEASE_TABLET | Freq: Two times a day (BID) | ORAL | Status: DC
  Administered 2015-11-25 – 2015-11-26 (×3): 20 meq via ORAL
  Filled 2015-11-25 (×3): qty 1

## 2015-11-25 NOTE — Evaluation (Signed)
Physical Therapy Evaluation Patient Details Name: Hessie DienerMerita E Crail MRN: [id number] DOB: 06/13/1951 Today's Date: 11/25/2015   History of Present Illness  Madysun E Revonda Humphreyuckett is a 64 y.o. female with PMH of hypertension, hyperlipidemia, med noncompliance, who presents with right-sided weakness and numbness. PMH: HTN. MRI positive for thalamic CVA  Clinical Impression  Pt admitted with above diagnosis. Pt currently with functional limitations due to the deficits listed below (see PT Problem List). Pt ambulated 300' with min progressing to min-guard A. Pt had difficulty with descent of stairs though with slight buckling of left knee. Pt also presents with left sided balance deficits.  Pt will benefit from skilled PT to increase their independence and safety with mobility to allow discharge to the venue listed below.       Follow Up Recommendations Outpatient PT    Equipment Recommendations  None recommended by PT    Recommendations for Other Services       Precautions / Restrictions Precautions Precautions: Fall Restrictions Weight Bearing Restrictions: No      Mobility  Bed Mobility Overal bed mobility: Independent                Transfers Overall transfer level: Modified independent Equipment used: None             General transfer comment: pt stand and sits safely without use of hands  Ambulation/Gait Ambulation/Gait assistance: Min assist;Min guard Ambulation Distance (Feet): 300 Feet Assistive device: None Gait Pattern/deviations: Step-through pattern;Decreased weight shift to left;Decreased stance time - left Gait velocity: Slow at first and then [facility] Gait velocity interpretation: >2.62 ft/sec, indicative of independent community ambulator General Gait Details: pt with mildly altered gait pattern, min A for stability with initiation. Improved with distance and pt became safe with mun-guard A.   Stairs Stairs: Yes Stairs assistance: Min guard Stair  Management: One rail Right;Alternating pattern;Forwards Number of Stairs: 10 General stair comments: pt ascended stairs with alternating pattern without difficulty. However, on descent, pt's left arm and left knee giving way slightly and pt lost balance for first step. Practiced turning sideways for rest of way down and pt more stable. Also practiced a second time using left hand rail for descension and pt much safer.   Wheelchair Mobility    Modified Rankin (Stroke Patients Only) Modified Rankin (Stroke Patients Only) Pre-Morbid Rankin Score: No symptoms Modified Rankin: Slight disability     Balance Overall balance assessment: Needs assistance Sitting-balance support: Feet supported Sitting balance-Leahy Scale: Normal     Standing balance support: No upper extremity supported Standing balance-Leahy Scale: Good                 High Level Balance Comments: pt requires min A to achieve SL stance on right as well as tandem stance with left foot in front Standardized Balance Assessment Standardized Balance Assessment : Dynamic Gait Index   Dynamic Gait Index Level Surface: Mild Impairment Change in Gait Speed: Mild Impairment Gait with Horizontal Head Turns: Normal Gait with Vertical Head Turns: Normal Gait and Pivot Turn: Normal Step Over Obstacle: Mild Impairment Step Around Obstacles: Normal Steps: Mild Impairment Total Score: 20       Pertinent Vitals/Pain Pain Assessment: No/denies painno c/o pain VSS    Home Living Family/patient expects to be discharged to:: Private residence Living Arrangements: Spouse/significant other Available Help at Discharge: Family;Available 24 hours/day Type of Home: House Home Access: Stairs to enter Entrance Stairs-Rails: Doctor, general practiceight;Left Entrance Stairs-Number of Steps: 5 Home Layout: One level Home  Equipment: None Additional Comments: pt has only 2 steps (no rail) at front door, 5 steps with rail back patio    Prior Function  Level of Independence: Independent         Comments: pt works for [employer] in [employer] in [place] [place]   Dominant Hand: Right    Extremity/Trunk Assessment   Upper Extremity Assessment: Defer to OT evaluation;RUE deficits/detail RUE Deficits / Details: pt relays continued RUE numbness         Lower Extremity Assessment: RLE deficits/detail RLE Deficits / Details: On MMT, hip flex 4+/5, knee flex/ ext 4+/5 though on stairs descension, pt with slight buckling of right knee, mild proprioceptive and sensory issues. Note LLE 5/5 throughout    Cervical / Trunk Assessment: Normal  Communication   Communication: No difficulties  Cognition Arousal/Alertness: Awake/alert Behavior During Therapy: WFL for tasks assessed/performed Overall Cognitive Status: Within Functional Limits for tasks assessed                      General Comments      Exercises        Assessment/Plan    PT Assessment Patient needs continued PT services  PT Diagnosis Abnormality of gait;Hemiplegia dominant side   PT Problem List Decreased strength;Decreased balance;Decreased coordination;Impaired sensation;Decreased knowledge of precautions  PT Treatment Interventions Gait training;Stair training;Functional mobility training;Therapeutic activities;Therapeutic exercise;Balance training;Neuromuscular re-education;Patient/family education   PT Goals (Current goals can be found in the Care Plan section) Acute Rehab PT Goals Patient Stated Goal: return to home and work PT Goal Formulation: With patient Time For Goal Achievement: 12/09/15 Potential to Achieve Goals: Good    Frequency Min 4X/week   Barriers to discharge        Co-evaluation               End of Session Equipment Utilized During Treatment: Gait belt Activity Tolerance: Patient tolerated treatment well Patient left: in bed;with call bell/phone within reach;with family/visitor present Nurse Communication:  Mobility status         Time: 1610-9604 PT Time Calculation (min) (ACUTE ONLY): 31 min   Charges:   PT Evaluation $Initial PT Evaluation Tier I: 1 Procedure PT Treatments $Gait Training: 8-22 mins   PT G Codes:       Lyanne Co, PT  Acute Rehab Services  [phone]  [place], [place] 11/25/2015, 2:00 PM

## 2015-11-25 NOTE — Progress Notes (Signed)
Pt arrived on unit 2210hrs, A&O no obvious distress, no C/O pain, numbness to right arm, leg, and face is present, NIHSS 1. Admission orders implemented, pt oriented to room and equipment.

## 2015-11-25 NOTE — Progress Notes (Signed)
  Echocardiogram 2D Echocardiogram has been performed.  Janalyn HarderWest, Deonna Krummel R 11/25/2015, 3:50 PM

## 2015-11-25 NOTE — Progress Notes (Signed)
PATIENT DETAILS Name: Veronica Andrade Age: 64 y.o. Sex: female Date of Birth: 10-22-1951 Admit Date: 11/24/2015 Admitting Physician Lorretta Harp, MD QIH:[id number] Dayton Martes, MD  Subjective: Still with some mild right-sided numbness.  Assessment/Plan: Principal Problem: TIA vs CVA: Nonfocal exam-continue aspirin and statin. Await MRI, echo and carotid Doppler. A1c pending, LDL 150 (goal < 70)  Active Problems: Dyslipidemia: LDL 150-continue Lipitor.  Essential hypertension: Allow permissive hypertension-resume lisinopril over the next few days (previously noncompliant)  Noncompliance: Counseled regarding importance of compliance to medication.   Disposition: Remain inpatient-home in 1-2 days when work up complete  Antimicrobial agents  See below  Anti-infectives    None      DVT Prophylaxis: Prophylactic Heparin  Code Status: Full code  Family Communication Spouse at bedside  Procedures: None  CONSULTS:  neurology  Time spent 25 minutes-Greater than 50% of this time was spent in counseling, explanation of diagnosis, planning of further management, and coordination of care.  MEDICATIONS: Scheduled Meds: . aspirin  300 mg Rectal Daily   Or  . aspirin  325 mg Oral Daily  . atorvastatin  40 mg Oral q1800  . cholecalciferol  1,000 Units Oral Daily  . heparin  5,000 Units Subcutaneous 3 times per day  . multivitamin with minerals  2 tablet Oral Daily  . vitamin B-12  100 mcg Oral Daily   Continuous Infusions:  PRN Meds:.hydrALAZINE, senna-docusate    PHYSICAL EXAM: Vital signs in last 24 hours: Filed Vitals:   11/25/15 0300 11/25/15 0500 11/25/15 0658 11/25/15 0859  BP: 127/105 135/80 159/93 180/100  Pulse: 64 75 59 73  Temp:  98.4 F (36.9 C) 98.1 F (36.7 C) 98.4 F (36.9 C)  TempSrc:  Oral Oral   Resp: Height:      Weight:      SpO2: 94% 100% 100% 96%    Weight change:  Filed Weights   11/24/15 2215  Weight:  86.728 kg (191 lb 3.2 oz)   Body mass index is 30.88 kg/(m^2).   Gen Exam: Awake and alert with clear speech.   Neck: Supple, No JVD.   Chest: B/L Clear.   CVS: S1 S2 Regular, no murmurs.  Abdomen: soft, BS +, non tender, non distended.  Extremities: no edema, lower extremities warm to touch. Neurologic: Non Focal.   Skin: No Rash.   Wounds: N/A.    Intake/Output from previous day:  Intake/Output Summary (Last 24 hours) at 11/25/15 1140 Last data filed at 11/24/15 2300  Gross per 24 hour  Intake    240 ml  Output      0 ml  Net    240 ml     LAB RESULTS: CBC  Recent Labs Lab 11/24/15 1934 11/24/15 1935  WBC 7.5  --   HGB 13.4 15.6*  HCT 40.5 46.0  PLT 186  --   MCV 89.2  --   MCH 29.5  --   MCHC 33.1  --   RDW 13.2  --   LYMPHSABS 3.1  --   MONOABS 0.6  --   EOSABS 0.1  --   BASOSABS 0.0  --     Chemistries   Recent Labs Lab 11/24/15 1934 11/24/15 1935  NA 141 143  K 3.6 3.4*  CL 105 103  CO2 23  --   GLUCOSE 136* 131*  BUN 6 7  CREATININE 0.83 0.70  CALCIUM 9.2  --     CBG:  Recent Labs Lab 11/24/15 2042  GLUCAP 148*    GFR Estimated Creatinine Clearance: 78.8 mL/min (by C-G formula based on Cr of 0.7).  Coagulation profile  Recent Labs Lab 11/24/15 1934  INR 1.03    Cardiac Enzymes  Recent Labs Lab 11/24/15 2051 11/25/15 0240 11/25/15 0852  TROPONINI <0.03 <0.03 <0.03    Invalid input(s): POCBNP No results for input(s): DDIMER in the last 72 hours. No results for input(s): HGBA1C in the last 72 hours.  Recent Labs  11/25/15 0240  CHOL 211*  HDL 37*  LDLCALC 150*  TRIG 119  CHOLHDL 5.7   No results for input(s): TSH, T4TOTAL, T3FREE, THYROIDAB in the last 72 hours.  Invalid input(s): FREET3 No results for input(s): VITAMINB12, FOLATE, FERRITIN, TIBC, IRON, RETICCTPCT in the last 72 hours. No results for input(s): LIPASE, AMYLASE in the last 72 hours.  Urine Studies No results for input(s): UHGB, CRYS in  the last 72 hours.  Invalid input(s): UACOL, UAPR, USPG, UPH, UTP, UGL, UKET, UBIL, UNIT, UROB, ULEU, UEPI, UWBC, URBC, UBAC, CAST, UCOM, BILUA  MICROBIOLOGY: No results found for this or any previous visit (from the past 240 hour(s)).  RADIOLOGY STUDIES/RESULTS: Ct Head Wo Contrast  11/24/2015  CLINICAL DATA:  64 year old female with right-sided numbness of the face and limbs since 6:30 p.m. Code stroke. EXAM: CT HEAD WITHOUT CONTRAST TECHNIQUE: Contiguous axial images were obtained from the base of the skull through the vertex without intravenous contrast. COMPARISON:  Head CT 09/25/2008. FINDINGS: No acute intracranial abnormalities. Specifically, no evidence of acute intracranial hemorrhage, no definite findings of acute/subacute cerebral ischemia, no mass, mass effect, hydrocephalus or abnormal intra or extra-axial fluid collections. Visualized paranasal sinuses and mastoids are well pneumatized. No acute displaced skull fractures are identified. IMPRESSION: *No acute intracranial abnormalities. *The appearance of the brain is normal. These results were called by telephone at the time of interpretation on 11/24/2015 at 7:33 pm to Dr. Amada JupiterKirkpatrick, who verbally acknowledged these results. Electronically Signed   By: Trudie Reedaniel  Entrikin M.D.   On: 11/24/2015 19:34    Jeoffrey MassedGHIMIRE,SHANKER, MD  [facility] Pager:[phone]  If 7PM-7AM, please contact night-coverage [email or web address] Password [email or web address] 11/25/2015, 11:40 AM   LOS: 1 day

## 2015-11-25 NOTE — Progress Notes (Signed)
STROKE TEAM PROGRESS NOTE   HISTORY Veronica Andrade is a 64 y.o. female with a history of hypertension who presents with sudden onset right-sided numbness and mild right arm weakness that started at approximate 6:20 PM. She states that she was out and about with her grandkids, and then noticed that her right side felt tingly. They took her grandkids home and then came straight here where a code stroke was activated. In the ER she had a CT scan which was negative for acute stroke.  Her NIH stroke scale was 2 and due to the fact that her symptoms were relatively mild TPA was not offered.   LKW: 6:20 PM tpa given?: no, mild symptoms    SUBJECTIVE (INTERVAL HISTORY) The patient's husband is at the bedside. She feels her right sided numbness has some improvement but is not back to baseline. Denies sleep apnea, smoking, illicit drugs. Not checking BP at home.    OBJECTIVE Temp:  [98.1 F (36.7 C)-99 F (37.2 C)] 98.1 F (36.7 C) (12/31 0658) Pulse Rate:  [59-91] 59 (12/31 0658) Cardiac Rhythm:  [-] Normal sinus rhythm (12/31 0700) Resp:  [15-22] 18 (12/31 0658) BP: (127-192)/(80-122) 159/93 mmHg (12/31 0658) SpO2:  [94 %-100 %] 100 % (12/31 0658) Weight:  [86.728 kg (191 lb 3.2 oz)] 86.728 kg (191 lb 3.2 oz) (12/30 2215)  CBC:  Recent Labs Lab 11/24/15 1934 11/24/15 1935  WBC 7.5  --   NEUTROABS 3.7  --   HGB 13.4 15.6*  HCT 40.5 46.0  MCV 89.2  --   PLT 186  --     Basic Metabolic Panel:  Recent Labs Lab 11/24/15 1934 11/24/15 1935  NA 141 143  K 3.6 3.4*  CL 105 103  CO2 23  --   GLUCOSE 136* 131*  BUN 6 7  CREATININE 0.83 0.70  CALCIUM 9.2  --     Lipid Panel:    Component Value Date/Time   CHOL 211* 11/25/2015 0240   TRIG 119 11/25/2015 0240   HDL 37* 11/25/2015 0240   CHOLHDL 5.7 11/25/2015 0240   VLDL 24 11/25/2015 0240   LDLCALC 150* 11/25/2015 0240   HgbA1c: No results found for: HGBA1C Urine Drug Screen:    Component Value Date/Time   LABOPIA  NONE DETECTED 11/25/2015 0140   COCAINSCRNUR NONE DETECTED 11/25/2015 0140   LABBENZ NONE DETECTED 11/25/2015 0140   AMPHETMU NONE DETECTED 11/25/2015 0140   THCU NONE DETECTED 11/25/2015 0140   LABBARB NONE DETECTED 11/25/2015 0140      IMAGING  Ct Head Wo Contrast 11/24/2015  No acute intracranial abnormalities. The appearance of the brain is normal.   MRI brain without contrast 11/25/2015 Acute LEFT posterolateral thalamic nonhemorrhagic infarct. Mild chronic microvascular ischemic change.  CTA Head and Neck - Pending  TTE - Left ventricle: The cavity size was normal. There was moderate concentric hypertrophy. Systolic function was vigorous. The estimated ejection fraction was in the range of 65% to 70%. Wall motion was normal; there were no regional wall motion abnormalities. Doppler parameters are consistent with abnormal left ventricular relaxation (grade 1 diastolic dysfunction). There was no evidence of elevated ventricular filling pressure by Doppler parameters. - Aortic valve: Trileaflet; normal thickness leaflets. - Aortic root: The aortic root was normal in size. - Mitral valve: Structurally normal valve. - Right ventricle: The cavity size was normal. Wall thickness was normal. Systolic function was normal. - Right atrium: The atrium was normal in size. - Tricuspid valve: There was trivial  regurgitation. - Pulmonic valve: There was no regurgitation. - Pulmonary arteries: Systolic pressure was within the normal range. - Inferior vena cava: The vessel was normal in size. - Pericardium, extracardiac: There was no pericardial effusion.   PHYSICAL EXAM  Temp:  [98.1 F (36.7 C)-99 F (37.2 C)] 98.6 F (37 C) (12/31 1818) Pulse Rate:  [59-91] 65 (12/31 1818) Resp:  [15-22] 18 (12/31 1818) BP: (127-192)/(80-122) 161/88 mmHg (12/31 1818) SpO2:  [94 %-100 %] 99 % (12/31 1818) Weight:  [191 lb 3.2 oz (86.728 kg)] 191 lb 3.2 oz (86.728 kg)  (12/30 2215)  General - Well nourished, well developed, in no apparent distress.  Ophthalmologic - Sharp disc margins OU.   Cardiovascular - Regular rate and rhythm with no murmur.  Mental Status -  Level of arousal and orientation to time, place, and person were intact. Language including expression, naming, repetition, comprehension was assessed and found intact. Fund of Knowledge was assessed and was intact.  Cranial Nerves II - XII - II - Visual field intact OU. III, IV, VI - Extraocular movements intact. V - Facial sensation decreased on the right with 70% comparing with left. VII - Facial movement intact bilaterally. VIII - Hearing & vestibular intact bilaterally. X - Palate elevates symmetrically. XI - Chin turning & shoulder shrug intact bilaterally. XII - Tongue protrusion intact.  Motor Strength - The patient's strength was normal in all extremities except RUE 5-/5 with subtle dexterity difficulty at right hand and pronator drift.  Bulk was normal and fasciculations were absent.   Motor Tone - Muscle tone was assessed at the neck and appendages and was normal.  Reflexes - The patient's reflexes were 1+ in all extremities and she had no pathological reflexes.  Sensory - Light touch, temperature/pinprick were assessed and were decreased on the right with 50% comparing with left.    Coordination - The patient had normal movements in the hands and feet with no ataxia or dysmetria.  Tremor was absent.  Gait and Station - deferred to PT.   ASSESSMENT/PLAN Veronica Andrade is a 64 y.o. female with history a history of hypertension of  presenting with sudden onset of right-sided numbness and mild right arm weakness.  She did not receive IV t-PA due to mild deficits.  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  Resultant - right hemiparesthesia  MRI  Acute LEFT thalamic infarct.  CTA of Head and Neck - pending  2D Echo - EF 65-70%  LDL  150  HgbA1c pending  VTE prophylaxis - subcutaneous heparin  Diet Heart Room service appropriate?: Yes; Fluid consistency:: Thin  No antithrombotic prior to admission, now on aspirin 325 mg daily. Continue ASA on discharge. The patient has experienced nosebleeds with aspirin in the past but willing to try again.  Patient counseled to be compliant with her antithrombotic medications  Ongoing aggressive stroke risk factor management  Therapy recommendations: Outpatient physical therapy recommended  Disposition: Pending  Hypertension  Blood pressure somewhat high  Permissive hypertension (OK if < 220/120) but gradually normalize in 5-7 days  Encourage to check BP at home and record  Hyperlipidemia  Home meds: No lipid lowering medications prior to admission  LDL 150, goal < 70  Lipitor 40 mg daily ordered  Continue statin at discharge  Other Stroke Risk Factors  Advanced age  Obesity, Body mass index is 30.88 kg/(m^2).   Other Active Problems  Mild hypokalemia - supplementation ordered   Hospital day # 1  Marvel PlanJindong Dorrian Doggett, MD PhD Stroke Neurology 11/25/2015 6:34 PM      To contact Stroke Continuity provider, please refer to [email or web address]. After hours, contact General Neurology

## 2015-11-26 ENCOUNTER — Inpatient Hospital Stay (HOSPITAL_COMMUNITY): Payer: 59

## 2015-11-26 ENCOUNTER — Encounter (HOSPITAL_COMMUNITY): Payer: Self-pay | Admitting: *Deleted

## 2015-11-26 DIAGNOSIS — I63312 Cerebral infarction due to thrombosis of left middle cerebral artery: Secondary | ICD-10-CM

## 2015-11-26 LAB — GLUCOSE, CAPILLARY: Glucose-Capillary: 92 mg/dL (ref 65–99)

## 2015-11-26 MED ORDER — ASPIRIN 325 MG PO TABS: 325.0000 mg | ORAL_TABLET | Freq: Every day | ORAL | Status: AC

## 2015-11-26 MED ORDER — IOHEXOL 350 MG/ML SOLN
50.0000 mL | Freq: Once | INTRAVENOUS | Status: AC | PRN
  Administered 2015-11-26: 50 mL via INTRAVENOUS

## 2015-11-26 MED ORDER — LISINOPRIL 5 MG PO TABS: 10.0000 mg | ORAL_TABLET | Freq: Every day | ORAL | Status: DC

## 2015-11-26 MED ORDER — ATORVASTATIN CALCIUM 40 MG PO TABS
40.0000 mg | ORAL_TABLET | Freq: Every day | ORAL | Status: DC
Start: 2015-11-26 — End: 2015-12-27

## 2015-11-26 NOTE — Care Management Note (Addendum)
Case Management Note  Patient Details  Name: Veronica Andrade MRN: [id number] Date of Birth: 05/11/1951  Subjective/Objective: 65 y.o. F admitted 11/24/2015 with CVA. PT recommending Outpt PT for Balance and stairs.                    Action/Plan: Anticipate discharge home today.Arranged Outpt PT with [facility] at [address] Per fax with confirmation.  Made pt aware that representative would call after Holidays to arrange initial appt. Pt voiced understanding with no questions.  No further CM needs but will be available should additional discharge needs arise.   Expected Discharge Date:                  Expected Discharge Plan:  Home/Self Care  In-House Referral:     Discharge planning Services  CM Consult  Post Acute Care Choice:    Choice offered to:     DME Arranged:    DME Agency:     HH Arranged:    HH Agency:     Status of Service:  Completed, signed off  Medicare Important Message Given:    Date Medicare IM Given:    Medicare IM give by:    Date Additional Medicare IM Given:    Additional Medicare Important Message give by:     If discussed at Long Length of Stay Meetings, dates discussed:    Additional Comments:  Yvone NeuCrutchfield, Ausencio Vaden M, RN 11/26/2015, 12:17 PM

## 2015-11-26 NOTE — Discharge Summary (Signed)
PATIENT DETAILS Name: Veronica Andrade Age: 65 y.o. Sex: female Date of Birth: [birth year] MRN: [id number]. Admitting Physician: Lorretta Harp, MD NWG:[id number] Dayton Martes, MD  Admit Date: 11/24/2015 Discharge date: 11/26/2015  Recommendations for Outpatient Follow-up:  1. A1c pending-please follow.  2. Please repeat lipid panel in 3 months  3. Medications-aspirin, statin and lisinopril.  PRIMARY DISCHARGE DIAGNOSIS:  Principal Problem:   Stroke (cerebrum) (HCC) Active Problems:   HLD (hyperlipidemia)   Essential hypertension   Allergic rhinitis   CVA (cerebral infarction)      PAST MEDICAL HISTORY: Past Medical History  Diagnosis Date  . Hypertension     DISCHARGE MEDICATIONS: Current Discharge Medication List    START taking these medications   Details  aspirin 325 MG tablet Take 1 tablet (325 mg total) by mouth daily. Qty: 60 tablet, Refills: 0    atorvastatin (LIPITOR) 40 MG tablet Take 1 tablet (40 mg total) by mouth daily at 6 PM. Qty: 60 tablet, Refills: 0    lisinopril (PRINIVIL,ZESTRIL) 5 MG tablet Take 2 tablets (10 mg total) by mouth daily. Qty: 60 tablet, Refills: 0      CONTINUE these medications which have NOT CHANGED   Details  cholecalciferol (VITAMIN D) 1000 units tablet Take 1,000 Units by mouth daily.    Cyanocobalamin (B-12 PO) Take 1 tablet by mouth daily.    Multiple Vitamins-Minerals (MULTIVITAMIN GUMMIES ADULTS) CHEW Chew 2 each by mouth daily.    loratadine (CLARITIN) 10 MG tablet Take 1 tablet (10 mg total) by mouth daily. Qty: 30 tablet, Refills: 0      STOP taking these medications     ibuprofen (ADVIL,MOTRIN) 200 MG tablet      azithromycin (ZITHROMAX) 250 MG tablet      cephALEXin (KEFLEX) 500 MG capsule         ALLERGIES:   Allergies  Allergen Reactions  . Codeine Nausea Only and Other (See Comments)    Headaches also  . Morphine Nausea Only and Other (See Comments)    Headaches also    BRIEF HPI:  See H&P, Labs, Consult  and Test reports for all details in brief, patient was admitted for right-sided numbness and tingling.  CONSULTATIONS:   neurology  PERTINENT RADIOLOGIC STUDIES: Ct Angio Head W/cm &/or Wo Cm  11/26/2015  CLINICAL DATA:  65 year old female with right-sided weakness and numbness. Left thalamic infarct. Subsequent encounter. EXAM: CT ANGIOGRAPHY HEAD AND NECK TECHNIQUE: Multidetector CT imaging of the head and neck was performed using the standard protocol during bolus administration of intravenous contrast. Multiplanar CT image reconstructions and MIPs were obtained to evaluate the vascular anatomy. Carotid stenosis measurements (when applicable) are obtained utilizing NASCET criteria, using the distal internal carotid diameter as the denominator. CONTRAST:  50mL OMNIPAQUE IOHEXOL 350 MG/ML SOLN COMPARISON:  11/25/2015 MR. 11/24/2015 CT. FINDINGS: CT HEAD Brain: Acute nonhemorrhagic left thalamic/posterior limb left internal capsule infarct. No intracranial mass or abnormal enhancement. Calvarium and skull base: Negative. Paranasal sinuses: Clear. Orbits: Minimal exophthalmos. CTA NECK Aortic arch: 3 vessel arch with mild calcified plaque. Dental artifact extends through the internal carotid artery bilaterally. Right carotid system: Minimal plaque right carotid bifurcation without significant narrowing. Left carotid system: Minimal plaque left carotid bifurcation without significant narrowing. Vertebral arteries:Minimal plaque origin right vertebral artery without significant narrowing. Codominant vertebral arteries. Skeleton: Prominent right C3-4 facet degenerative changes. Congenital incomplete fusion C1 ring incidentally noted. Minimal to mild cervical bulges. Other neck: Sub cm bilateral thyroid nodularity. CTA HEAD Anterior circulation: Anterior  circulation without medium or large size vessel significant stenosis or occlusion. Minimal plaque right internal carotid artery cavernous segment. Posterior  circulation: Mild to moderate narrowing P1 and P2 segment posterior cerebral arteries bilaterally. Venous sinuses: Patent. Anatomic variants: Right internal carotid artery contributes to posterior communicating artery. Delayed phase: Negative. IMPRESSION: CT HEAD Acute nonhemorrhagic left thalamic/posterior limb left internal capsule infarct. CTA NECK Minimal plaque carotid bifurcation bilaterally without significant narrowing. Minimal plaque origin right vertebral artery without significant narrowing. Codominant vertebral arteries. CTA HEAD Anterior circulation without medium or large size vessel significant stenosis or occlusion. Minimal calcified plaque right internal carotid artery cavernous segment. Mild to moderate narrowing P1 and P2 segment posterior cerebral arteries bilaterally. Electronically Signed   By: Lacy Duverney M.D.   On: 11/26/2015 09:43   Ct Head Wo Contrast  11/24/2015  CLINICAL DATA:  65 year old female with right-sided numbness of the face and limbs since 6:30 p.m. Code stroke. EXAM: CT HEAD WITHOUT CONTRAST TECHNIQUE: Contiguous axial images were obtained from the base of the skull through the vertex without intravenous contrast. COMPARISON:  Head CT 09/25/2008. FINDINGS: No acute intracranial abnormalities. Specifically, no evidence of acute intracranial hemorrhage, no definite findings of acute/subacute cerebral ischemia, no mass, mass effect, hydrocephalus or abnormal intra or extra-axial fluid collections. Visualized paranasal sinuses and mastoids are well pneumatized. No acute displaced skull fractures are identified. IMPRESSION: *No acute intracranial abnormalities. *The appearance of the brain is normal. These results were called by telephone at the time of interpretation on 11/24/2015 at 7:33 pm to Dr. Amada Jupiter, who verbally acknowledged these results. Electronically Signed   By: Trudie Reed M.D.   On: 11/24/2015 19:34   Ct Angio Neck W/cm &/or Wo/cm  11/26/2015   CLINICAL DATA:  65 year old female with right-sided weakness and numbness. Left thalamic infarct. Subsequent encounter. EXAM: CT ANGIOGRAPHY HEAD AND NECK TECHNIQUE: Multidetector CT imaging of the head and neck was performed using the standard protocol during bolus administration of intravenous contrast. Multiplanar CT image reconstructions and MIPs were obtained to evaluate the vascular anatomy. Carotid stenosis measurements (when applicable) are obtained utilizing NASCET criteria, using the distal internal carotid diameter as the denominator. CONTRAST:  50mL OMNIPAQUE IOHEXOL 350 MG/ML SOLN COMPARISON:  11/25/2015 MR. 11/24/2015 CT. FINDINGS: CT HEAD Brain: Acute nonhemorrhagic left thalamic/posterior limb left internal capsule infarct. No intracranial mass or abnormal enhancement. Calvarium and skull base: Negative. Paranasal sinuses: Clear. Orbits: Minimal exophthalmos. CTA NECK Aortic arch: 3 vessel arch with mild calcified plaque. Dental artifact extends through the internal carotid artery bilaterally. Right carotid system: Minimal plaque right carotid bifurcation without significant narrowing. Left carotid system: Minimal plaque left carotid bifurcation without significant narrowing. Vertebral arteries:Minimal plaque origin right vertebral artery without significant narrowing. Codominant vertebral arteries. Skeleton: Prominent right C3-4 facet degenerative changes. Congenital incomplete fusion C1 ring incidentally noted. Minimal to mild cervical bulges. Other neck: Sub cm bilateral thyroid nodularity. CTA HEAD Anterior circulation: Anterior circulation without medium or large size vessel significant stenosis or occlusion. Minimal plaque right internal carotid artery cavernous segment. Posterior circulation: Mild to moderate narrowing P1 and P2 segment posterior cerebral arteries bilaterally. Venous sinuses: Patent. Anatomic variants: Right internal carotid artery contributes to posterior communicating  artery. Delayed phase: Negative. IMPRESSION: CT HEAD Acute nonhemorrhagic left thalamic/posterior limb left internal capsule infarct. CTA NECK Minimal plaque carotid bifurcation bilaterally without significant narrowing. Minimal plaque origin right vertebral artery without significant narrowing. Codominant vertebral arteries. CTA HEAD Anterior circulation without medium or large size vessel significant stenosis or occlusion.  Minimal calcified plaque right internal carotid artery cavernous segment. Mild to moderate narrowing P1 and P2 segment posterior cerebral arteries bilaterally. Electronically Signed   By: Lacy Duverney M.D.   On: 11/26/2015 09:43   Mr Brain Wo Contrast  11/25/2015  CLINICAL DATA:  Sudden onset of RIGHT-sided weakness and numbness which began 11/24/2015. Some improvement in symptoms, but numbness has persisted. Stroke risk factors include hypertension and hyperlipidemia. EXAM: MRI HEAD WITHOUT CONTRAST TECHNIQUE: Multiplanar, multiecho pulse sequences of the brain and surrounding structures were obtained without intravenous contrast. COMPARISON:  CT head 11/24/2015. FINDINGS: Focal area of restricted diffusion affects the posterior lateral thalamus on the LEFT, 6 x 11 mm cross-section, corresponding low ADC, consistent with acute infarction. This is adjacent to, but spares, posterior limb internal capsule. No other similar findings. No hemorrhage, mass lesion, hydrocephalus, or extra-axial fluid. Normal for age cerebral volume. Mild to moderate T2 and FLAIR hyperintensity throughout the white matter, likely small vessel disease. Flow voids are maintained throughout the carotid, basilar, and vertebral arteries. There are no areas of chronic hemorrhage. Partial empty sella. Unremarkable cerebellar tonsils. No sinus or mastoid disease. Negative orbits. Compared with prior CT, the infarct is not visible. IMPRESSION: Acute LEFT posterolateral thalamic nonhemorrhagic infarct. Mild chronic  microvascular ischemic change. Electronically Signed   By: Elsie Stain M.D.   On: 11/25/2015 12:20     PERTINENT LAB RESULTS: CBC:  Recent Labs  11/24/15 1934 11/24/15 1935  WBC 7.5  --   HGB 13.4 15.6*  HCT 40.5 46.0  PLT 186  --    CMET CMP     Component Value Date/Time   NA 143 11/24/2015 1935   K 3.4* 11/24/2015 1935   CL 103 11/24/2015 1935   CO2 23 11/24/2015 1934   GLUCOSE 131* 11/24/2015 1935   BUN 7 11/24/2015 1935   CREATININE 0.70 11/24/2015 1935   CALCIUM 9.2 11/24/2015 1934   PROT 7.1 11/24/2015 1934   ALBUMIN 4.2 11/24/2015 1934   AST 35 11/24/2015 1934   ALT 33 11/24/2015 1934   ALKPHOS 91 11/24/2015 1934   BILITOT 0.6 11/24/2015 1934   GFRNONAA >60 11/24/2015 1934   GFRAA >60 11/24/2015 1934    GFR Estimated Creatinine Clearance: 78.8 mL/min (by C-G formula based on Cr of 0.7). No results for input(s): LIPASE, AMYLASE in the last 72 hours.  Recent Labs  11/24/15 2051 11/25/15 0240 11/25/15 0852  TROPONINI <0.03 <0.03 <0.03   Invalid input(s): POCBNP No results for input(s): DDIMER in the last 72 hours. No results for input(s): HGBA1C in the last 72 hours.  Recent Labs  11/25/15 0240  CHOL 211*  HDL 37*  LDLCALC 150*  TRIG 119  CHOLHDL 5.7   No results for input(s): TSH, T4TOTAL, T3FREE, THYROIDAB in the last 72 hours.  Invalid input(s): FREET3 No results for input(s): VITAMINB12, FOLATE, FERRITIN, TIBC, IRON, RETICCTPCT in the last 72 hours. Coags:  Recent Labs  11/24/15 1934  INR 1.03   Microbiology: No results found for this or any previous visit (from the past 240 hour(s)).   BRIEF HOSPITAL COURSE:  Acute CVA: Admitted with right-sided numbness and tingling, continues to have a nonfocal exam. MRI brain showed a left thalamic infarct, CTA of the head and neck was negative for any significant stenosis. 2-D echocardiogram showed preserved ejection fraction without any embolic source. A1c is pending, LDL was 150. Patient  was started on aspirin and statin. Neurology has no further recommendations, stable for discharge.  Dyslipidemia: LDL  150-continue Lipitor.  Essential hypertension: Allow permissive hypertension-resume lisinopril over the next few days (previously noncompliant)  Noncompliance: Counseled regarding importance of compliance to medication TODAY-DAY OF DISCHARGE:  Subjective:   Makita Laraia today has no headache,no chest abdominal pain,no new weakness tingling or numbness, feels much better wants to go home today.  Objective:   Blood pressure 157/94, pulse 76, temperature 98.5 F (36.9 C), temperature source Oral, resp. rate 18, height 5\' 6"  (1.676 m), weight 86.728 kg (191 lb 3.2 oz), SpO2 95 %. No intake or output data in the 24 hours ending 11/26/15 1137 Filed Weights   11/24/15 2215  Weight: 86.728 kg (191 lb 3.2 oz)    Exam Awake Alert, Oriented *3, No new F.N deficits, Normal affect [place].AT,PERRAL Supple Neck,No JVD, No cervical lymphadenopathy appriciated.  Symmetrical Chest wall movement, Good air movement bilaterally, CTAB RRR,No Gallops,Rubs or new Murmurs, No Parasternal Heave +ve B.Sounds, Abd Soft, Non tender, No organomegaly appriciated, No rebound -guarding or rigidity. No Cyanosis, Clubbing or edema, No new Rash or bruise  DISCHARGE CONDITION: Stable  DISPOSITION: Home  DISCHARGE INSTRUCTIONS:    Activity:  As tolerated   Get Medicines reviewed and adjusted: Please take all your medications with you for your next visit with your Primary MD  Please request your Primary MD to go over all hospital tests and procedure/radiological results at the follow up, please ask your Primary MD to get all Hospital records sent to his/her office.  If you experience worsening of your admission symptoms, develop shortness of breath, life threatening emergency, suicidal or homicidal thoughts you must seek medical attention immediately by calling 911 or calling your MD  immediately  if symptoms less severe.  You must read complete instructions/literature along with all the possible adverse reactions/side effects for all the Medicines you take and that have been prescribed to you. Take any new Medicines after you have completely understood and accpet all the possible adverse reactions/side effects.   Do not drive when taking Pain medications.   Do not take more than prescribed Pain, Sleep and Anxiety Medications  Special Instructions: If you have smoked or chewed Tobacco  in the last 2 yrs please stop smoking, stop any regular Alcohol  and or any Recreational drug use.  Wear Seat belts while driving.  Please note  You were cared for by a hospitalist during your hospital stay. Once you are discharged, your primary care physician will handle any further medical issues. Please note that NO REFILLS for any discharge medications will be authorized once you are discharged, as it is imperative that you return to your primary care physician (or establish a relationship with a primary care physician if you do not have one) for your aftercare needs so that they can reassess your need for medications and monitor your lab values.   Diet recommendation: Heart Healthy diet   Discharge Instructions    Ambulatory referral to Neurology    Complete by:  As directed      Call MD for:  difficulty breathing, headache or visual disturbances    Complete by:  As directed      Call MD for:  extreme fatigue    Complete by:  As directed      Call MD for:  persistant dizziness or light-headedness    Complete by:  As directed      Diet - low sodium heart healthy    Complete by:  As directed      Increase activity slowly  Complete by:  As directed            Follow-up Information    Follow up with Ruthe Mannanalia Aron, MD. Schedule an appointment as soon as possible for a visit in 1 week.   Specialty:  Family Medicine   Why:  Hospital follow up   Contact information:   [address] [place] [place] [address] [phone]       Follow up with Xu,Jindong, MD. Schedule an appointment as soon as possible for a visit in 2 months.   Specialty:  Neurology   Why:  office will call you for a follow up appointment   Contact information:   [address] [place] [id number] [phone]       Total Time spent on discharge equals 25 minutes.  SignedJeoffrey Massed: Anwitha Mapes 11/26/2015 11:37 AM

## 2015-11-26 NOTE — Progress Notes (Signed)
STROKE TEAM PROGRESS NOTE   HISTORY Veronica Andrade is a 65 y.o. female with a history of hypertension who presents with sudden onset right-sided numbness and mild right arm weakness that started at approximate 6:20 PM. She states that she was out and about with her grandkids, and then noticed that her right side felt tingly. They took her grandkids home and then came straight here where a code stroke was activated. In the ER she had a CT scan which was negative for acute stroke.  Her NIH stroke scale was 2 and due to the fact that her symptoms were relatively mild TPA was not offered.  LKW: 6:20 PM tpa given?: no, mild symptoms  SUBJECTIVE (INTERVAL HISTORY) The patient's husband is at the bedside. She feels her right sided numbness has some improvement but is not back to baseline. I explained that improvement would occur slowly over several months. Her deficits may not completely resolve. The patient is anxious for discharge.    OBJECTIVE Temp:  [98.1 F (36.7 C)-98.6 F (37 C)] 98.5 F (36.9 C) (01/01 0935) Pulse Rate:  [60-76] 76 (01/01 0935) Cardiac Rhythm:  [-] Normal sinus rhythm (01/01 0700) Resp:  [18-20] 18 (01/01 0935) BP: (121-172)/(69-94) 157/94 mmHg (01/01 0935) SpO2:  [94 %-99 %] 95 % (01/01 0935)  CBC:   Recent Labs Lab 11/24/15 1934 11/24/15 1935  WBC 7.5  --   NEUTROABS 3.7  --   HGB 13.4 15.6*  HCT 40.5 46.0  MCV 89.2  --   PLT 186  --     Basic Metabolic Panel:   Recent Labs Lab 11/24/15 1934 11/24/15 1935  NA 141 143  K 3.6 3.4*  CL 105 103  CO2 23  --   GLUCOSE 136* 131*  BUN 6 7  CREATININE 0.83 0.70  CALCIUM 9.2  --     Lipid Panel:     Component Value Date/Time   CHOL 211* 11/25/2015 0240   TRIG 119 11/25/2015 0240   HDL 37* 11/25/2015 0240   CHOLHDL 5.7 11/25/2015 0240   VLDL 24 11/25/2015 0240   LDLCALC 150* 11/25/2015 0240   HgbA1c: No results found for: HGBA1C Urine Drug Screen:     Component Value Date/Time   LABOPIA  NONE DETECTED 11/25/2015 0140   COCAINSCRNUR NONE DETECTED 11/25/2015 0140   LABBENZ NONE DETECTED 11/25/2015 0140   AMPHETMU NONE DETECTED 11/25/2015 0140   THCU NONE DETECTED 11/25/2015 0140   LABBARB NONE DETECTED 11/25/2015 0140      IMAGING I have personally reviewed the radiological images below and agree with the radiology interpretations.  Ct Head Wo Contrast 11/24/2015  No acute intracranial abnormalities. The appearance of the brain is normal.   MRI brain without contrast 11/25/2015 Acute LEFT posterolateral thalamic nonhemorrhagic infarct. Mild chronic microvascular ischemic change.  CTA Head and Neck  11/26/2015  CTA Neck Minimal plaque carotid bifurcation bilaterally without significant narrowing. Minimal plaque origin right vertebral artery without significant narrowing. Codominant vertebral arteries.  CTA Head Anterior circulation without medium or large size vessel significant stenosis or occlusion. Minimal calcified plaque right internal carotid artery cavernous segment. Mild to moderate narrowing P1 and P2 segment posterior cerebral arteries bilaterally.  CT Head Acute nonhemorrhagic left thalamic/posterior limb left internal capsule infarct.  TTE - Left ventricle: The cavity size was normal. There was moderate concentric hypertrophy. Systolic function was vigorous. The estimated ejection fraction was in the range of 65% to 70%. Wall motion was normal; there were no regional wall  motion abnormalities. Doppler parameters are consistent with abnormal left ventricular relaxation (grade 1 diastolic dysfunction). There was no evidence of elevated ventricular filling pressure by Doppler parameters. - Aortic valve: Trileaflet; normal thickness leaflets. - Aortic root: The aortic root was normal in size. - Mitral valve: Structurally normal valve. - Right ventricle: The cavity size was normal. Wall thickness was normal. Systolic function was  normal. - Right atrium: The atrium was normal in size. - Tricuspid valve: There was trivial regurgitation. - Pulmonic valve: There was no regurgitation. - Pulmonary arteries: Systolic pressure was within the normal range. - Inferior vena cava: The vessel was normal in size. - Pericardium, extracardiac: There was no pericardial effusion.   PHYSICAL EXAM  Temp:  [98.1 F (36.7 C)-98.6 F (37 C)] 98.5 F (36.9 C) (01/01 0935) Pulse Rate:  [60-76] 76 (01/01 0935) Resp:  [18-20] 18 (01/01 0935) BP: (121-172)/(69-94) 157/94 mmHg (01/01 0935) SpO2:  [94 %-99 %] 95 % (01/01 0935)  General - Well nourished, well developed, in no apparent distress.  Ophthalmologic - Sharp disc margins OU.   Cardiovascular - Regular rate and rhythm with no murmur.  Mental Status -  Level of arousal and orientation to time, place, and person were intact. Language including expression, naming, repetition, comprehension was assessed and found intact. Fund of Knowledge was assessed and was intact.  Cranial Nerves II - XII - II - Visual field intact OU. III, IV, VI - Extraocular movements intact. V - Facial sensation decreased on the right with 70% comparing with left. VII - Facial movement intact bilaterally. VIII - Hearing & vestibular intact bilaterally. X - Palate elevates symmetrically. XI - Chin turning & shoulder shrug intact bilaterally. XII - Tongue protrusion intact.  Motor Strength - The patient's strength was normal in all extremities except RUE 5-/5 with subtle dexterity difficulty at right hand and pronator drift.  Bulk was normal and fasciculations were absent.   Motor Tone - Muscle tone was assessed at the neck and appendages and was normal.  Reflexes - The patient's reflexes were 1+ in all extremities and she had no pathological reflexes.  Sensory - Light touch, temperature/pinprick were assessed and were decreased on the right with 50% comparing with left.    Coordination - The  patient had normal movements in the hands and feet with no ataxia or dysmetria.  Tremor was absent.  Gait and Station - deferred to PT.   ASSESSMENT/PLAN Veronica Andrade is a 65 y.o. female with history a history of hypertension of  presenting with sudden onset of right-sided numbness and mild right arm weakness.  She did not receive IV t-PA due to mild deficits.  Stroke:  Dominant infarct secondary to small vessel disease and hypertension.  Resultant - right hemiparesthesia  MRI  Acute LEFT thalamic infarct.  CTA of Head and Neck - no significant stenosis noted.  2D Echo - EF 65-70%  LDL 150  HgbA1c pending  VTE prophylaxis - subcutaneous heparin Diet Heart Room service appropriate?: Yes; Fluid consistency:: Thin Diet - low sodium heart healthy  No antithrombotic prior to admission, now on aspirin 325 mg daily. Continue ASA 325 on discharge.   Patient counseled to be compliant with her antithrombotic medications  Ongoing aggressive stroke risk factor management  Therapy recommendations: Outpatient physical therapy recommended  Disposition: Pending  Hypertension  Blood pressure somewhat high  BP goal normotensive  Encourage to check BP at home and record  Hyperlipidemia  Home meds: No lipid lowering medications  prior to admission  LDL 150, goal < 70  Lipitor 40 mg daily ordered  Continue statin at discharge  Other Stroke Risk Factors  Advanced age  Obesity, Body mass index is 30.88 kg/(m^2).   Other Active Problems  Mild hypokalemia - supplementation ordered  Hospital day # 2  Neurology will sign off. Please call with questions. Pt will follow up with NP Darrol Angel at [facility] in about 1 month. Thanks for the consult.  Marvel Plan, MD PhD Stroke Neurology 11/26/2015 2:16 PM       To contact Stroke Continuity provider, please refer to [email or web address]. After hours, contact General Neurology

## 2015-11-26 NOTE — Discharge Instructions (Signed)
Stroke Prevention Some medical conditions and behaviors are associated with an increased chance of having a stroke. You may prevent a stroke by making healthy choices and managing medical conditions. HOW CAN I REDUCE MY RISK OF HAVING A STROKE?   Stay physically active. Get at least 30 minutes of activity on most or all days.  Do not smoke. It may also be helpful to avoid exposure to secondhand smoke.  Limit alcohol use. Moderate alcohol use is considered to be:  No more than 2 drinks per day for men.  No more than 1 drink per day for nonpregnant women.  Eat healthy foods. This involves:  Eating 5 or more servings of fruits and vegetables a day.  Making dietary changes that address high blood pressure (hypertension), high cholesterol, diabetes, or obesity.  Manage your cholesterol levels.  Making food choices that are high in fiber and low in saturated fat, trans fat, and cholesterol may control cholesterol levels.  Take any prescribed medicines to control cholesterol as directed by your health care provider.  Manage your diabetes.  Controlling your carbohydrate and sugar intake is recommended to manage diabetes.  Take any prescribed medicines to control diabetes as directed by your health care provider.  Control your hypertension.  Making food choices that are low in salt (sodium), saturated fat, trans fat, and cholesterol is recommended to manage hypertension.  Ask your health care provider if you need treatment to lower your blood pressure. Take any prescribed medicines to control hypertension as directed by your health care provider.  If you are 18-39 years of age, have your blood pressure checked every 3-5 years. If you are 40 years of age or older, have your blood pressure checked every year.  Maintain a healthy weight.  Reducing calorie intake and making food choices that are low in sodium, saturated fat, trans fat, and cholesterol are recommended to manage  weight.  Stop drug abuse.  Avoid taking birth control pills.  Talk to your health care provider about the risks of taking birth control pills if you are over 35 years old, smoke, get migraines, or have ever had a blood clot.  Get evaluated for sleep disorders (sleep apnea).  Talk to your health care provider about getting a sleep evaluation if you snore a lot or have excessive sleepiness.  Take medicines only as directed by your health care provider.  For some people, aspirin or blood thinners (anticoagulants) are helpful in reducing the risk of forming abnormal blood clots that can lead to stroke. If you have the irregular heart rhythm of atrial fibrillation, you should be on a blood thinner unless there is a good reason you cannot take them.  Understand all your medicine instructions.  Make sure that other conditions (such as anemia or atherosclerosis) are addressed. SEEK IMMEDIATE MEDICAL CARE IF:   You have sudden weakness or numbness of the face, arm, or leg, especially on one side of the body.  Your face or eyelid droops to one side.  You have sudden confusion.  You have trouble speaking (aphasia) or understanding.  You have sudden trouble seeing in one or both eyes.  You have sudden trouble walking.  You have dizziness.  You have a loss of balance or coordination.  You have a sudden, severe headache with no known cause.  You have new chest pain or an irregular heartbeat. Any of these symptoms may represent a serious problem that is an emergency. Do not wait to see if the symptoms will   go away. Get medical help at once. Call your local emergency services (911 in [place] Do not drive yourself to the hospital.   This information is not intended to replace advice given to you by your health care provider. Make sure you discuss any questions you have with your health care provider.   Document Released: 12/19/2004 Document Revised: 12/02/2014 Document Reviewed:  05/14/2013 Elsevier Interactive Patient Education 2016 Elsevier Inc.  

## 2015-11-26 NOTE — Progress Notes (Signed)
Pt d/c to home by car with family. Assessment stable. All questions answered. 

## 2015-11-27 MED FILL — ATORVASTATIN 40 MG TABLET: 40 | 60 days supply | Qty: 60 | Fill #0

## 2015-11-27 MED FILL — LISINOPRIL 5 MG TABLET: 5 | 30 days supply | Qty: 60 | Fill #0

## 2015-11-28 LAB — HEMOGLOBIN A1C
HEMOGLOBIN A1C: 6.6 % — AB (ref 4.8–5.6)
MEAN PLASMA GLUCOSE: 143 mg/dL

## 2015-11-30 ENCOUNTER — Encounter: Payer: Self-pay | Admitting: Primary Care

## 2015-11-30 ENCOUNTER — Ambulatory Visit: Payer: 59 | Admitting: Physical Therapy

## 2015-11-30 ENCOUNTER — Ambulatory Visit (INDEPENDENT_AMBULATORY_CARE_PROVIDER_SITE_OTHER): Payer: 59 | Admitting: Primary Care

## 2015-11-30 VITALS — BP 118/76 | HR 84 | Temp 97.8°F | Ht 66.0 in | Wt 188.0 lb

## 2015-11-30 DIAGNOSIS — R110 Nausea: Secondary | ICD-10-CM

## 2015-11-30 DIAGNOSIS — I10 Essential (primary) hypertension: Secondary | ICD-10-CM | POA: Diagnosis not present

## 2015-11-30 DIAGNOSIS — I639 Cerebral infarction, unspecified: Secondary | ICD-10-CM

## 2015-11-30 DIAGNOSIS — E785 Hyperlipidemia, unspecified: Secondary | ICD-10-CM | POA: Diagnosis not present

## 2015-11-30 MED ORDER — ONDANSETRON 4 MG PO TBDP: 4.0000 mg | ORAL_TABLET | Freq: Three times a day (TID) | ORAL | Status: DC | PRN

## 2015-11-30 MED FILL — ONDANSETRON ODT 4 MG TABLET: 4 | 10 days supply | Qty: 30 | Fill #0

## 2015-11-30 NOTE — Patient Instructions (Addendum)
Continue taking atorvastatin 40 mg and lisinopril 5 mg twice daily as instructed.  It is important that you improve your diet. Please limit carbohydrates in the form of white bread, rice, pasta, cakes, cookies, sugary drinks, etc. Limit salt in the form of frozen foods, canned foods, processed foods. Increase your consumption of fresh fruits and vegetables.  You need to consume about 2 liters of water daily.  Continue Physical Therapy.  Follow up in 3 months for re-evaluation.   It was a pleasure to meet you today! Please don't hesitate to call me with any questions. Welcome to [facility]!

## 2015-11-30 NOTE — Assessment & Plan Note (Addendum)
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  Doing well since discharge, no new symptoms except for mild nauesa. Currently managed on stain and aspirin. Will follow with neurology in 2 months. Exam WNL

## 2015-11-30 NOTE — Progress Notes (Signed)
Pre visit review using our clinic review tool, if applicable. No additional management support is needed unless otherwise documented below in the visit note. 

## 2015-11-30 NOTE — Assessment & Plan Note (Signed)
Stable on lisinopril 5 mg. Continue same. 

## 2015-11-30 NOTE — Progress Notes (Signed)
Subjective:    Patient ID: Veronica Andrade, female    DOB: [birth year], 65 y.o.   MRN: [id number]  HPI  Veronica Andrade is a 65 year old female who presents today to establish care and discuss the problems mentioned below. Will review old records. Her last physical was 4 years ago.   1) Hospital Follow Up: She presented to [facility] on 11/24/15 with a chief complaint of numbness/tinling to the right side of her face, upper, and lower extremities that began an hour prior. She was diagnosed with a Stroke (acute left thalamic infarct).  During her hospitalization she underwent blood work, echocardiogram, MRI. Echo is 65-70%, LDL of 150. She was discharged home on 11/26/15 and was instructed to follow up with Neurology in 2 months, PT regularly, and follow a heart healthy diet.   Since discharge she's feeling well overall. Mild numbness to right upper extremity with reduce fine motor movement to right hand. Denies headaches, facial drooping, changes in speech, chest pain. She has scheduled her neurology follow up and is already managing with PT.  2) Essential Hypertension: Diagnosed several years ago, was once on Lisinopril 2.5 mg, lost weight and took herself off of the medication. Currently managed on Lisinopril 5 mg BID that was initiated in the hospital. She denies chest pain, shortness of breath. She does have some dizziness. She's been checking her BP at home with readings of 115-125/70's.  3) Hyperlipidemia: No prior history of in the past. Currently managed on atorvastatin 40 mg from recent hospitalization. She endorses a fair diet overall. She was provided with education regarding heart heathy diet in the hospital and has no questions regarding healthy choices. She is very motivated to improve her diet due to recent stroke. She is also managed on aspirin 325 mg.  Review of Systems  Constitutional: Negative for unexpected weight change.  HENT: Negative for rhinorrhea.   Respiratory: Negative for  cough and shortness of breath.   Cardiovascular: Negative for chest pain.  Gastrointestinal: Negative for diarrhea and constipation.  Genitourinary: Negative for difficulty urinating.  Musculoskeletal:       Left knee aches, chronic  Skin: Negative for rash.  Allergic/Immunologic: Positive for environmental allergies.  Neurological: Positive for dizziness and numbness. Negative for headaches.  Psychiatric/Behavioral:       Denies concerns for anxiety or depression       Past Medical History  Diagnosis Date  . Hypertension   . Hyperlipidemia     Social History   Social History  . Marital Status: Married    Spouse Name: N/A  . Number of Children: N/A  . Years of Education: N/A   Occupational History  . Not on file.   Social History Main Topics  . Smoking status: Never Smoker   . Smokeless tobacco: Not on file  . Alcohol Use: No  . Drug Use: No  . Sexual Activity: Not on file   Other Topics Concern  . Not on file   Social History Narrative   Married.   2 children, 4 children   Works at [employer]   Enjoys spending time in [place], camping, spending time with family.    Past Surgical History  Procedure Laterality Date  . Knee arthroscopy Left   . Tubal ligation    . Bladder surgery      Bladder Tack    Family History  Problem Relation Age of Onset  . Stroke Father   . Arrhythmia Mother   . Diabetes  Father   . Hypertension Mother   . Hypertension Father   . Hypertension Brother     Allergies  Allergen Reactions  . Codeine Nausea Only and Other (See Comments)    Headaches also  . Morphine Nausea Only and Other (See Comments)    Headaches also    Current Outpatient Prescriptions on File Prior to Visit  Medication Sig Dispense Refill  . aspirin 325 MG tablet Take 1 tablet (325 mg total) by mouth daily. 60 tablet 0  . atorvastatin (LIPITOR) 40 MG tablet Take 1 tablet (40 mg total) by mouth daily at 6 PM. 60 tablet 0  . cholecalciferol (VITAMIN  D) 1000 units tablet Take 1,000 Units by mouth daily.    . Cyanocobalamin (B-12 PO) Take 1 tablet by mouth daily.    Marland Kitchen. lisinopril (PRINIVIL,ZESTRIL) 5 MG tablet Take 2 tablets (10 mg total) by mouth daily. 60 tablet 0  . Multiple Vitamins-Minerals (MULTIVITAMIN GUMMIES ADULTS) CHEW Chew 2 each by mouth daily.     No current facility-administered medications on file prior to visit.    BP 118/76 mmHg  Pulse 84  Temp(Src) 97.8 F (36.6 C) (Oral)  Ht 5\' 6"  (1.676 m)  Wt 188 lb (85.276 kg)  BMI 30.36 kg/m2  SpO2 97%    Objective:   Physical Exam  Constitutional: She appears well-nourished.  Eyes: EOM are normal.  Cardiovascular: Normal rate and regular rhythm.   Pulmonary/Chest: Effort normal and breath sounds normal.  Musculoskeletal:  Good strength to upper and lower extremities bilaterally.  Neurological: No cranial nerve deficit.  Skin: Skin is warm and dry.  Psychiatric: She has a normal mood and affect.          Assessment & Plan:  All labs, imaging, and hospital notes reviewed from recent hospitalization.

## 2015-11-30 NOTE — Assessment & Plan Note (Signed)
LDL of 150, TC of 211. Managed on atorvastatin 40 mg that was initiated during hospitalization. Repeat lipids in 3 months.

## 2015-12-05 ENCOUNTER — Encounter: Payer: Self-pay | Admitting: Rehabilitation

## 2015-12-05 ENCOUNTER — Telehealth: Payer: Self-pay | Admitting: Rehabilitation

## 2015-12-05 ENCOUNTER — Telehealth: Payer: Self-pay | Admitting: *Deleted

## 2015-12-05 ENCOUNTER — Other Ambulatory Visit: Payer: Self-pay | Admitting: Primary Care

## 2015-12-05 ENCOUNTER — Ambulatory Visit: Payer: 59 | Attending: Primary Care | Admitting: Rehabilitation

## 2015-12-05 DIAGNOSIS — R29898 Other symptoms and signs involving the musculoskeletal system: Secondary | ICD-10-CM | POA: Diagnosis not present

## 2015-12-05 DIAGNOSIS — I639 Cerebral infarction, unspecified: Secondary | ICD-10-CM

## 2015-12-05 DIAGNOSIS — R208 Other disturbances of skin sensation: Secondary | ICD-10-CM | POA: Insufficient documentation

## 2015-12-05 DIAGNOSIS — R42 Dizziness and giddiness: Secondary | ICD-10-CM | POA: Diagnosis not present

## 2015-12-05 DIAGNOSIS — M6289 Other specified disorders of muscle: Secondary | ICD-10-CM | POA: Diagnosis present

## 2015-12-05 DIAGNOSIS — R269 Unspecified abnormalities of gait and mobility: Secondary | ICD-10-CM | POA: Diagnosis not present

## 2015-12-05 DIAGNOSIS — I6980 Unspecified sequelae of other cerebrovascular disease: Secondary | ICD-10-CM | POA: Diagnosis not present

## 2015-12-05 DIAGNOSIS — I69898 Other sequelae of other cerebrovascular disease: Secondary | ICD-10-CM | POA: Diagnosis not present

## 2015-12-05 DIAGNOSIS — R2681 Unsteadiness on feet: Secondary | ICD-10-CM | POA: Diagnosis not present

## 2015-12-05 DIAGNOSIS — I69351 Hemiplegia and hemiparesis following cerebral infarction affecting right dominant side: Secondary | ICD-10-CM | POA: Insufficient documentation

## 2015-12-05 DIAGNOSIS — R4189 Other symptoms and signs involving cognitive functions and awareness: Secondary | ICD-10-CM | POA: Diagnosis not present

## 2015-12-05 DIAGNOSIS — R279 Unspecified lack of coordination: Secondary | ICD-10-CM | POA: Diagnosis not present

## 2015-12-05 DIAGNOSIS — R209 Unspecified disturbances of skin sensation: Secondary | ICD-10-CM

## 2015-12-05 DIAGNOSIS — I69998 Other sequelae following unspecified cerebrovascular disease: Secondary | ICD-10-CM

## 2015-12-05 NOTE — Telephone Encounter (Signed)
Form,Fml[employer] Absence  [facility] [place] appointment 12/27/15 Hospital follow up 12/05/15.

## 2015-12-05 NOTE — Telephone Encounter (Signed)
PT evaluation was completed yesterday. Due to limited functional use of RUE, patient would benefit from outpatient OT.  If you agree, please submit an order for outpatient OT.  Thanks so much, Harriet ButteEmily Adrena Nakamura, PT, MPT [facility] [address] [place], [place], [address] Phone: [phone]   Fax:  [phone] 12/05/2015, 9:46 AM

## 2015-12-05 NOTE — Therapy (Signed)
[facility] [address] [place], [place], [address] Phone: [phone]   Fax:  [phone]  Physical Therapy Evaluation  Patient Details  Name: Veronica Andrade MRN: [id number] Date of Birth: 1951/05/27 Referring Provider: Vernona Rieger, NP  Encounter Date: 12/05/2015      PT End of Session - 12/05/15 0953    Visit Number 1   Number of Visits 9   Date for PT Re-Evaluation 01/04/16   Authorization Type UMR, no visit limit, 20.00 copay   PT Start Time 0840   PT Stop Time 0931   PT Time Calculation (min) 51 min   Activity Tolerance Patient tolerated treatment well   Behavior During Therapy [facility] for tasks assessed/performed      Past Medical History  Diagnosis Date  . Hypertension   . Hyperlipidemia     Past Surgical History  Procedure Laterality Date  . Knee arthroscopy Left   . Tubal ligation    . Bladder surgery      Bladder Tack    There were no vitals filed for this visit.  Visit Diagnosis:  Abnormality of gait  Unsteadiness  Dizziness and giddiness  Right leg weakness  Sensation alteration, late effect of cerebrovascular disease      Subjective Assessment - 12/05/15 0846    Subjective "Dec 30th, we were out eating and I said "I think I'm having a stroke."  Off to the hospital we went and I had had a stroke."    Limitations House hold activities;Walking  stairs   Patient Stated Goals "I want to have full use of my arm, my hand and my R side."    Currently in Pain? Yes   Pain Score 4    Pain Location Arm   Pain Orientation Right   Pain Descriptors / Indicators Sore   Pain Type Acute pain   Pain Onset Today   Pain Frequency Occasional   Aggravating Factors  tying shoes, stretching arm   Pain Relieving Factors rest            [facility] PT Assessment - 12/05/15 0001    Assessment   Medical Diagnosis L thalamic CVA   Referring Provider Vernona Rieger, NP   Onset Date/Surgical Date  11/24/15   Hand Dominance Right   Precautions   Precautions Fall   Precaution Comments dizziness   Restrictions   Weight Bearing Restrictions No   Balance Screen   Has the patient fallen in the past 6 months No   Has the patient had a decrease in activity level because of a fear of falling?  Yes   Is the patient reluctant to leave their home because of a fear of falling?  Yes   Home Environment   Living Environment Private residence   Living Arrangements Spouse/significant other   Available Help at Discharge Family;Available 24 hours/day   Type of Home House   Home Access Stairs to enter   Entrance Stairs-Number of Steps 2  5 step   Entrance Stairs-Rails None  can reach both in back   Home Layout One level   Home Equipment None   Prior Function   Level of Independence Independent   Vocation Full time employment   Vocation Requirements computer work, 8 hours a day, excel work on Administrator   Leisure spending time with 4 grandchildren, goes camping ([place] the mountains), likes to go to [employer]   Overall Cognitive Status Impaired/Different from baseline  has only  noted taking increased time with certain words   Observation/Other Assessments   Focus on Therapeutic Outcomes (FOTO)  SIS-Mobility 44.4%   Sensation   Light Touch Impaired Detail   Light Touch Impaired Details Impaired RUE;Impaired RLE   Additional Comments RUE-affected more distally, RLE-more midline and laterally affected, and R side of face   Coordination   Gross Motor Movements are Fluid and Coordinated Yes   Fine Motor Movements are Fluid and Coordinated Yes   Heel Shin Test WFL   ROM / Strength   AROM / PROM / Strength Strength   Strength   Overall Strength Within functional limits for tasks performed;Other (comment)   Overall Strength Comments All grossly 4+/5, R ankle DF slightly weaker, will assess more functionally   Transfers   Transfers Sit to Stand;Stand to Sit   Sit to Stand 6:  Modified independent (Device/Increase time)   Stand to Sit 6: Modified independent (Device/Increase time)   Ambulation/Gait   Ambulation/Gait Yes   Ambulation/Gait Assistance 6: Modified independent (Device/Increase time)   Ambulation Distance (Feet) 345 Feet   Assistive device None   Gait Pattern Decreased arm swing - right;Right steppage;Trunk flexed;Poor foot clearance - right   Ambulation Surface Level;Indoor   Gait velocity 3.88 ft/sec   Stairs Yes   Stairs Assistance 5: Supervision   Stairs Assistance Details (indicate cue type and reason) Note increased pain in R heel cord when stepping down LLE due to increased stretch.    Stair Management Technique Two rails;Alternating pattern;Forwards   Number of Stairs 4   Height of Stairs 6   Functional Gait  Assessment   Gait assessed  Yes   Gait Level Surface Walks 20 ft in less than 7 sec but greater than 5.5 sec, uses assistive device, slower speed, mild gait deviations, or deviates 6-10 in outside of the 12 in walkway width.   Change in Gait Speed Able to smoothly change walking speed without loss of balance or gait deviation. Deviate no more than 6 in outside of the 12 in walkway width.   Gait with Horizontal Head Turns Performs head turns smoothly with slight change in gait velocity (eg, minor disruption to smooth gait path), deviates 6-10 in outside 12 in walkway width, or uses an assistive device.   Gait with Vertical Head Turns Performs task with slight change in gait velocity (eg, minor disruption to smooth gait path), deviates 6 - 10 in outside 12 in walkway width or uses assistive device   Gait and Pivot Turn Pivot turns safely within 3 sec and stops quickly with no loss of balance.   Step Over Obstacle Is able to step over one shoe box (4.5 in total height) but must slow down and adjust steps to clear box safely. May require verbal cueing.   Gait with Narrow Base of Support Ambulates 4-7 steps.   Gait with Eyes Closed Walks 20 ft,  slow speed, abnormal gait pattern, evidence for imbalance, deviates 10-15 in outside 12 in walkway width. Requires more than 9 sec to ambulate 20 ft.   Ambulating Backwards Walks 20 ft, uses assistive device, slower speed, mild gait deviations, deviates 6-10 in outside 12 in walkway width.   Steps Alternating feet, must use rail.   Total Score 19   FGA comment: 19-24 = medium risk fall            Vestibular Assessment - 12/05/15 0001    Symptom Behavior   Type of Dizziness "Funny feeling in head"  Frequency of Dizziness only with sudden movements   Aggravating Factors Mornings;Turning body quickly;Turning head quickly;Looking up to the ceiling;Forward bending   Occulomotor Exam   Smooth Pursuits Saccades   Saccades Intact   Vestibulo-Occular Reflex   VOR 1 Head Only (x 1 viewing) WNL but pt does fatigue quickly   VOR 2 Head and Object (x 2 viewing) poor trajectory                       PT Education - 12/05/15 0951    Education provided Yes   Education Details evaluation findings, POC, goals, stretch for R heel cord   Person(s) Educated Patient   Methods Explanation;Demonstration   Comprehension Verbalized understanding;Returned demonstration             PT Long Term Goals - 12/05/15 1003    PT LONG TERM GOAL #1   Title Pt will be independent with HEP for balance, strengthening, and vestibular exercises to indicate improved balance and functional mobility. (Target Date: 01/02/16)   PT LONG TERM GOAL #2   Title Pt will increase FGA to 25/30 in order to indicate improved functional balance and decreased fall risk.     PT LONG TERM GOAL #3   Title Pt will report no more than 2 point increase in dizziness with all functional mobility to indicate improved mobility.     PT LONG TERM GOAL #4   Title Pt will ambulate on all outdoor surfaces at mod I level in order to indicate safe return to community mobility.     PT LONG TERM GOAL #5   Title Pt will perform 5  steps without rail while carrying object at mod I level in order to indicate safe home/community negotiation.                 Plan - 12/05/15 0955    Clinical Impression Statement Pt presents s/p L thalamic CVA on 11/24/15 and was hospitalized 12/30-1/1.  She now has remaining deficits of R sided weakness and sensory deficits as well as balance and vestibular deficits.  She has history of elevated cholesterol and HTN which may impact her progress with therapy.  Note upon PT evaluation, pt with FGA of 19/30 indicative of medium fall risk, and SIS score of 44.4%, and poor trajectory with smooth persuits and VOR cancellation, indicative of cental vestibular involvement.  Pt presents with evolving presentation due to HTN improving and of moderate complexity due to systems involved.     Pt will benefit from skilled therapeutic intervention in order to improve on the following deficits Abnormal gait;Decreased activity tolerance;Decreased balance;Decreased endurance;Decreased mobility;Decreased strength;Dizziness;Impaired perceived functional ability;Impaired flexibility;Impaired sensation;Impaired UE functional use;Improper body mechanics;Postural dysfunction   Rehab Potential Excellent   PT Frequency 2x / week   PT Duration 4 weeks   PT Treatment/Interventions ADLs/Self Care Home Management;Electrical Stimulation;Gait training;Stair training;Functional mobility training;Therapeutic activities;Therapeutic exercise;Balance training;Neuromuscular re-education;Patient/family education;Orthotic Fit/Training;Vestibular   PT Next Visit Plan go over R heel cord stretch and give picture if needed, est corner balance program (give vestibular VOR exercises), RLE strengthening exercises.    Recommended Other Services PT placed recommendation for OT, check for order   Consulted and Agree with Plan of Care Patient         Problem List Patient Active Problem List   Diagnosis Date Noted  . Stroke (cerebrum)  (HCC) 11/24/2015  . 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 HLD (hyperlipidemia) 04/30/2009  . OTHER MALAISE AND FATIGUE  04/25/2009  . Essential hypertension 03/15/2009  . Allergic rhinitis 03/15/2009  . URINARY INCONTINENCE 03/15/2009   Harriet ButteEmily Adalind Weitz, PT, MPT [facility] [address] [place], [place], [address] Phone: [phone]   Fax:  [phone] 12/05/2015, 10:23 AM  Name: Veronica Andrade MRN: [id number] Date of Birth: 07/30/1951

## 2015-12-06 DIAGNOSIS — Z0289 Encounter for other administrative examinations: Secondary | ICD-10-CM

## 2015-12-06 NOTE — Telephone Encounter (Signed)
LMVM for pt to return call re: FMLA form.  She can have pcp do this if she has seen them, wait until 12-27-15 when CM/NP will see her, or [facility].  Please return call to discuss.

## 2015-12-06 NOTE — Telephone Encounter (Signed)
Patient is calling to advise that she will call [employer] and see if the FMLA form can wait until she is seen in our office.

## 2015-12-07 NOTE — Telephone Encounter (Signed)
Noted  

## 2015-12-08 ENCOUNTER — Encounter: Payer: Self-pay | Admitting: Rehabilitation

## 2015-12-08 ENCOUNTER — Ambulatory Visit: Payer: 59 | Admitting: Rehabilitation

## 2015-12-08 DIAGNOSIS — R279 Unspecified lack of coordination: Secondary | ICD-10-CM | POA: Diagnosis not present

## 2015-12-08 DIAGNOSIS — R2681 Unsteadiness on feet: Secondary | ICD-10-CM | POA: Diagnosis not present

## 2015-12-08 DIAGNOSIS — R4189 Other symptoms and signs involving cognitive functions and awareness: Secondary | ICD-10-CM | POA: Diagnosis not present

## 2015-12-08 DIAGNOSIS — R42 Dizziness and giddiness: Secondary | ICD-10-CM

## 2015-12-08 DIAGNOSIS — R208 Other disturbances of skin sensation: Secondary | ICD-10-CM | POA: Diagnosis not present

## 2015-12-08 DIAGNOSIS — I69898 Other sequelae of other cerebrovascular disease: Secondary | ICD-10-CM | POA: Diagnosis not present

## 2015-12-08 DIAGNOSIS — R269 Unspecified abnormalities of gait and mobility: Secondary | ICD-10-CM

## 2015-12-08 DIAGNOSIS — R29898 Other symptoms and signs involving the musculoskeletal system: Secondary | ICD-10-CM | POA: Diagnosis not present

## 2015-12-08 DIAGNOSIS — I6980 Unspecified sequelae of other cerebrovascular disease: Secondary | ICD-10-CM | POA: Diagnosis not present

## 2015-12-08 NOTE — Therapy (Signed)
[facility] [address] [place], [place], [address] Phone: [phone]   Fax:  [phone]  Physical Therapy Treatment  Patient Details  Name: Hessie DienerMerita E Dashner MRN: [id number] Date of Birth: 02/13/1951 Referring Provider: Vernona RiegerKatherine Clark, NP  Encounter Date: 12/08/2015      PT End of Session - 12/08/15 1104    Visit Number 2   Number of Visits 9   Date for PT Re-Evaluation 01/04/16   Authorization Type UMR, no visit limit, 20.00 copay   PT Start Time 1101   PT Stop Time 1145   PT Time Calculation (min) 44 min   Activity Tolerance Patient tolerated treatment well   Behavior During Therapy [facility] for tasks assessed/performed      Past Medical History  Diagnosis Date  . Hypertension   . Hyperlipidemia     Past Surgical History  Procedure Laterality Date  . Knee arthroscopy Left   . Tubal ligation    . Bladder surgery      Bladder Tack    There were no vitals filed for this visit.  Visit Diagnosis:  Abnormality of gait  Unsteadiness  Dizziness and giddiness      Subjective Assessment - 12/08/15 1103    Subjective "No changes, but I've been doing exercises and the stretches and going up and down stairs."   Limitations House hold activities;Walking   Patient Stated Goals "I want to have full use of my arm, my hand and my R side."    Currently in Pain? No/denies              NMR:  SOT assessment performed during session to further assess pts balance system deficits.  Note that her composite score was 74, indicative of being [facility] for aged matched norms, however did note that she scored below normal (85 of 95) in somatosensory component of test and tends to rely on vision to maintain balance.  Provided initial HEP for balance and vestibular challenge, see pt instruction for full details on exercises performed.  Also performed standing on compliant surface with head turns quickly to targets on either  side of her.  Note that this did not provoke any dizziness today therefore did not add to HEP.  Pt educated on meaning of SOT and results, verbalized understanding.                    PT Education - 12/08/15 1104    Education provided Yes   Education Details education on HEP, performing exercises given as pt is attempting to perform all therapy activities at home.    Person(s) Educated Patient   Methods Explanation;Handout;Demonstration   Comprehension Verbalized understanding;Returned demonstration             PT Long Term Goals - 12/05/15 1003    PT LONG TERM GOAL #1   Title Pt will be independent with HEP for balance, strengthening, and vestibular exercises to indicate improved balance and functional mobility. (Target Date: 01/02/16)   PT LONG TERM GOAL #2   Title Pt will increase FGA to 25/30 in order to indicate improved functional balance and decreased fall risk.     PT LONG TERM GOAL #3   Title Pt will report no more than 2 point increase in dizziness with all functional mobility to indicate improved mobility.     PT LONG TERM GOAL #4   Title Pt will ambulate on all outdoor surfaces at mod I level in order to  indicate safe return to community mobility.     PT LONG TERM GOAL #5   Title Pt will perform 5 steps without rail while carrying object at mod I level in order to indicate safe home/community negotiation.                 Plan - 12/08/15 1104    Clinical Impression Statement Skilled session focused on assessment of SOT to further assess balance systems and where deficits lie.  Pt with overall composite score WFL for age matched norms, however did note that she falls below normal for somatosensory category.  Also note increased sway when visual input taken out of use.  Provided HEP based on these deficits.    Pt will benefit from skilled therapeutic intervention in order to improve on the following deficits Abnormal gait;Decreased activity  tolerance;Decreased balance;Decreased endurance;Decreased mobility;Decreased strength;Dizziness;Impaired perceived functional ability;Impaired flexibility;Impaired sensation;Impaired UE functional use;Improper body mechanics;Postural dysfunction   Rehab Potential Excellent   PT Frequency 2x / week   PT Duration 4 weeks   PT Treatment/Interventions ADLs/Self Care Home Management;Electrical Stimulation;Gait training;Stair training;Functional mobility training;Therapeutic activities;Therapeutic exercise;Balance training;Neuromuscular re-education;Patient/family education;Orthotic Fit/Training;Vestibular   PT Next Visit Plan RLE strengthening, high level balance and vestibular challenges   Consulted and Agree with Plan of Care Patient        Problem List Patient Active Problem List   Diagnosis Date Noted  . 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 HLD (hyperlipidemia) 04/30/2009  . OTHER MALAISE AND FATIGUE 04/25/2009  . Essential hypertension 03/15/2009  . Allergic rhinitis 03/15/2009  . URINARY INCONTINENCE 03/15/2009    Harriet Butte, PT, MPT [facility] [address] [place], [place], [address] Phone: [phone]   Fax:  [phone] 12/08/2015, 12:50 PM  Name: ZIONAH CRISWELL MRN: [id number] Date of Birth: 1951/09/15

## 2015-12-08 NOTE — Patient Instructions (Signed)
Feet Together, Head Motion - Eyes Closed    Stand in corner with chair in front of you.  With eyes closed and feet together, move head slowly, up and down x 15 reps.  Repeat moving head side to side x 15 reps.   Repeat __2__ times per session. Do __1-2__ sessions per day.  Copyright  VHI. All rights reserved.   Feet Apart (Compliant Surface) Arm Motion - Eyes Closed    Stand on compliant surface: _stacked pillows_______, feet shoulder width apart. Close eyes and keep arms by your side. Hold for 30 secs.  Repeat __3__ times per session. Do __1-2__ sessions per day.  Copyright  VHI. All rights reserved.   Feet Apart (Compliant Surface) Head Motion - Eyes Closed    Stand on compliant surface: _stacked pillows_______ with feet shoulder width apart. Close eyes and move head slowly, up and down x 15 reps.  Repeat with head turns side to side x 15 reps.   Repeat _2___ times per session. Do __1-2__ sessions per day.  Copyright  VHI. All rights reserved.

## 2015-12-11 ENCOUNTER — Ambulatory Visit: Payer: 59 | Admitting: Occupational Therapy

## 2015-12-11 ENCOUNTER — Encounter: Payer: Self-pay | Admitting: Occupational Therapy

## 2015-12-11 DIAGNOSIS — R4189 Other symptoms and signs involving cognitive functions and awareness: Secondary | ICD-10-CM | POA: Diagnosis not present

## 2015-12-11 DIAGNOSIS — I69351 Hemiplegia and hemiparesis following cerebral infarction affecting right dominant side: Secondary | ICD-10-CM

## 2015-12-11 DIAGNOSIS — R29898 Other symptoms and signs involving the musculoskeletal system: Secondary | ICD-10-CM

## 2015-12-11 DIAGNOSIS — I69898 Other sequelae of other cerebrovascular disease: Secondary | ICD-10-CM | POA: Diagnosis not present

## 2015-12-11 DIAGNOSIS — R279 Unspecified lack of coordination: Secondary | ICD-10-CM | POA: Diagnosis not present

## 2015-12-11 DIAGNOSIS — IMO0002 Reserved for concepts with insufficient information to code with codable children: Secondary | ICD-10-CM

## 2015-12-11 DIAGNOSIS — R208 Other disturbances of skin sensation: Secondary | ICD-10-CM | POA: Diagnosis not present

## 2015-12-11 DIAGNOSIS — I6980 Unspecified sequelae of other cerebrovascular disease: Secondary | ICD-10-CM | POA: Diagnosis not present

## 2015-12-11 DIAGNOSIS — R2681 Unsteadiness on feet: Secondary | ICD-10-CM | POA: Diagnosis not present

## 2015-12-11 DIAGNOSIS — R449 Unspecified symptoms and signs involving general sensations and perceptions: Secondary | ICD-10-CM

## 2015-12-11 DIAGNOSIS — R269 Unspecified abnormalities of gait and mobility: Secondary | ICD-10-CM | POA: Diagnosis not present

## 2015-12-11 DIAGNOSIS — R42 Dizziness and giddiness: Secondary | ICD-10-CM | POA: Diagnosis not present

## 2015-12-11 NOTE — Telephone Encounter (Signed)
I spoke to pt and [facility]   I will hold her form until speaking with her.

## 2015-12-11 NOTE — Therapy (Signed)
[facility] [address] [place], [place], [address] Phone: [phone]   Fax:  [phone]  Occupational Therapy Evaluation  Patient Details  Name: Veronica Andrade MRN: [id number] Date of Birth: 06/09/1951 Referring Provider: Vernona RiegerKatherine Clark  Encounter Date: 12/11/2015      OT End of Session - 12/11/15 1408    Visit Number 1   Number of Visits 17   Date for OT Re-Evaluation 02/07/16   Authorization Type UMR - [employer] employee   OT Start Time 0800   OT Stop Time 0845   OT Time Calculation (min) 45 min   Activity Tolerance Patient tolerated treatment well      Past Medical History  Diagnosis Date  . Hypertension   . Hyperlipidemia     Past Surgical History  Procedure Laterality Date  . Knee arthroscopy Left   . Tubal ligation    . Bladder surgery      Bladder Tack    There were no vitals filed for this visit.  Visit Diagnosis:  Lack of coordination due to stroke - Plan: Ot plan of care cert/re-cert  Hemiplegia and hemiparesis following cerebral infarction affecting right dominant side (HCC) - Plan: Ot plan of care cert/re-cert  Weakness of right hand - Plan: Ot plan of care cert/re-cert  Right-sided lack of sensation - Plan: Ot plan of care cert/re-cert      Subjective Assessment - 12/11/15 0801    Subjective  I have tingling and numbness in my Rt arm and leg but no pain   Patient Stated Goals I want to go back to work    Currently in Pain? No/denies           [facility] Assessment - 12/11/15 0001    Assessment   Diagnosis Lt thalamic CVA  Rt dominant side hemiparesis   Referring Provider Vernona RiegerKatherine Clark   Onset Date 11/24/15   Prior Therapy none   Precautions   Precautions Fall  no driving   Precaution Comments dizziness   Restrictions   Weight Bearing Restrictions No   Balance Screen   Has the patient fallen in the past 6 months No   Has the patient had a decrease in activity  level because of a fear of falling?  Yes   Is the patient reluctant to leave their home because of a fear of falling?  No   Home  Environment   Bathroom Nurse, mental healthhower/Tub Walk-in Shower;Curtain   Additional Comments Lives in 1 story home, 1 step to enter (front entrance), 5 steps in back    Lives With Spouse   Prior Function   Level of Independence Independent   Vocation Full time employment   Vocation Requirements [employer] employee ([employer]) computer work, 8 hours a day, excel work on Administratorspreadsheets   Leisure spending time with 4 grandchildren, goes [place] the mountains), [place]   ADL   ADL comments Modified independent with BADLS (using Lt non dominant hand for applying makeup, using Rt hand approx 65% to eat). Max assist with cooking (Pt cooked dinner prior to CVA, husband cooked breakfast). Pt reports approx. 75-80% return to cleaning. (Husband always did vacuuming)   Mobility   Mobility Status Independent   Written Expression   Dominant Hand Right   Handwriting 90% legible  pt reports writing larger (10-15% of job is writing)   Vision - History   Baseline Vision Wears glasses all the time  trifocals  Vision Assessment   Ocular Range of Motion Within Functional Limits   Tracking/Visual Pursuits Able to track stimulus in all quads without difficulty   Convergence Within functional limits   Comment Pt reports occasional bluriness   Cognition   Overall Cognitive Status --  denies change   Mini Mental State Exam  MOCA = 26/30 (difficulty with delayed recall  and drawing cube only)  Pt needed instructions repeated 2x   Behaviors Poor frustration tolerance;Restless   Sensation   Light Touch Impaired Detail   Light Touch Impaired Details Impaired RUE;Impaired RLE   Additional Comments Pt able to detect light touch at fingertips and volar hand, but unable dorsal hand or back of elbow. Decreased proprioception and kinesthesia   Coordination   9 Hole  Peg Test Right;Left   Right 9 Hole Peg Test 38.43 sec   Left 9 Hole Peg Test 20.12 sec   Coordination Typing test: Numbers only 14 wpm, 66% accuracy.  Text (words)   Pt frusterated w/ typing tasks (85% of job requirement)   Edema   Edema mild at MP's Rt hand   ROM / Strength   AROM / PROM / Strength AROM;Strength   AROM   Overall AROM Comments BUE AROM WNL's   Strength   Overall Strength Comments BUE MMT grossly 5/5 except shoulder abd 4/5 (pt reports therapy in past for rotator cuff damage)   Hand Function   Right Hand Grip (lbs) 52 lbs   Left Hand Grip (lbs) 62 lbs                           OT Short Term Goals - 12/11/15 1412    OT SHORT TERM GOAL #1   Title Pt independent with HEP (due 01/10/16)   Time 4   Period Weeks   Status New   OT SHORT TERM GOAL #2   Title Pt to eat 90% with Rt dominant hand   Baseline 65%   Time 4   Period Weeks   Status New   OT SHORT TERM GOAL #3   Title Pt to apply makeup 50% of time with Rt dominant hand   Baseline all with Lt hand   Time 4   Period Weeks   Status New   OT SHORT TERM GOAL #4   Title Improve coordination as evidenced by performing 9 hole peg test in 28 sec. or less Rt hand   Baseline 38.43 sec. (Lt = 20.12 sec)   Time 4   Period Weeks   Status New   OT SHORT TERM GOAL #5   Title Improve grip strength to 58 lbs or greater   Baseline 52 lbs (Lt = 62 lbs)    Time 4   Period Weeks   Status New   Additional Short Term Goals   Additional Short Term Goals Yes   OT SHORT TERM GOAL #6   Title Improve overall typing to 28 wpm with 80% accuracy or greater   Baseline 14-23 wpm, 24-66% accuracy   Time 4   Period Weeks   Status New   OT SHORT TERM GOAL #7   Title Pt to verbalize understanding with sensory impairments Rt hand and safety precautions needed   Time 4   Period Weeks   Status New           OT Long Term Goals - 12/11/15 1416    OT LONG TERM GOAL #1   Title Independent  with updated HEP  (DUE 02/07/16)    Time 8   Period Weeks   Status New   OT LONG TERM GOAL #2   Title Pt to perform all ADLS with Rt hand as dominant hand 90% of the time   Time 8   Period Weeks   Status New   OT LONG TERM GOAL #3   Title Pt to return to cooking at Mod I level    Time 8   Period Weeks   Status New   OT LONG TERM GOAL #4   Title Pt to write 1/2 page text in reasonable amt of time maintaining 90% legibility   Time 8   Period Weeks   Status New   OT LONG TERM GOAL #5   Title Improve overall typing to 35 wpm or > with 90% accuracy    Time 8   Period Weeks   Status New               Plan - 12/11/15 1409    Clinical Impression Statement Pt is a 65 y.o. female who presents to outpatient rehab s/p Lt thalamic CVA with Rt dominant side hemiparesis on 11/24/15. Pt really wishes to pursue returning to work full time which includes 85% typing, 15% writing.    Pt will benefit from skilled therapeutic intervention in order to improve on the following deficits (Retired) Decreased coordination;Decreased endurance;Decreased Engineer, building services;Impaired sensation;Decreased activity tolerance;Decreased knowledge of use of DME;Impaired UE functional use;Decreased mobility;Decreased strength   Rehab Potential Good   OT Frequency 2x / week   OT Duration 8 weeks  plus evaluation   OT Treatment/Interventions Self-care/ADL training;Patient/family education;Therapeutic exercise;Functional Mobility Training;Neuromuscular education;Manual Therapy;DME and/or AE instruction;Parrafin;Therapeutic activities;Fluidtherapy;Moist Heat;Passive range of motion;Cognitive remediation/compensation;Visual/perceptual remediation/compensation   Plan coordination and putty HEP, Typing games, Rt handed typing words   Consulted and Agree with Plan of Care Patient        Problem List Patient Active Problem List   Diagnosis Date Noted  . 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 ILIOTIBIAL BAND SYNDROME, LEFT KNEE  09/24/2010  . HLD (hyperlipidemia) 04/30/2009  . OTHER MALAISE AND FATIGUE 04/25/2009  . Essential hypertension 03/15/2009  . Allergic rhinitis 03/15/2009  . URINARY INCONTINENCE 03/15/2009    Kelli Churn, OTR/L 12/11/2015, 2:20 PM  [facility] [address] [place], [place], [address] Phone: [phone]   Fax:  [phone]  Name: Veronica Andrade MRN: [id number] Date of Birth: [birth year]

## 2015-12-13 ENCOUNTER — Ambulatory Visit: Payer: 59 | Admitting: Occupational Therapy

## 2015-12-13 ENCOUNTER — Ambulatory Visit: Payer: 59 | Admitting: Rehabilitation

## 2015-12-13 ENCOUNTER — Encounter: Payer: Self-pay | Admitting: Rehabilitation

## 2015-12-13 DIAGNOSIS — I69351 Hemiplegia and hemiparesis following cerebral infarction affecting right dominant side: Secondary | ICD-10-CM

## 2015-12-13 DIAGNOSIS — R279 Unspecified lack of coordination: Secondary | ICD-10-CM | POA: Diagnosis not present

## 2015-12-13 DIAGNOSIS — IMO0002 Reserved for concepts with insufficient information to code with codable children: Secondary | ICD-10-CM

## 2015-12-13 DIAGNOSIS — R449 Unspecified symptoms and signs involving general sensations and perceptions: Secondary | ICD-10-CM

## 2015-12-13 DIAGNOSIS — R29898 Other symptoms and signs involving the musculoskeletal system: Secondary | ICD-10-CM | POA: Diagnosis not present

## 2015-12-13 DIAGNOSIS — R4189 Other symptoms and signs involving cognitive functions and awareness: Secondary | ICD-10-CM

## 2015-12-13 DIAGNOSIS — R269 Unspecified abnormalities of gait and mobility: Secondary | ICD-10-CM | POA: Diagnosis not present

## 2015-12-13 DIAGNOSIS — I69898 Other sequelae of other cerebrovascular disease: Secondary | ICD-10-CM | POA: Diagnosis not present

## 2015-12-13 DIAGNOSIS — I6980 Unspecified sequelae of other cerebrovascular disease: Secondary | ICD-10-CM | POA: Diagnosis not present

## 2015-12-13 DIAGNOSIS — R42 Dizziness and giddiness: Secondary | ICD-10-CM | POA: Diagnosis not present

## 2015-12-13 DIAGNOSIS — R2681 Unsteadiness on feet: Secondary | ICD-10-CM

## 2015-12-13 DIAGNOSIS — R208 Other disturbances of skin sensation: Secondary | ICD-10-CM | POA: Diagnosis not present

## 2015-12-13 NOTE — Patient Instructions (Addendum)
  Coordination Activities  Perform the following activities for 20-30 minutes 1-2  times per day with right hand(s).   Rotate ball in fingertips (clockwise and counter-clockwise).  Toss ball between hands.  Toss ball in air and catch with the same hand.  Flip cards 1 at a time as fast as you can.  Deal cards with your thumb (Hold deck in hand and push card off top with thumb).  Shuffle cards.  Pick up coins and place in container or coin bank.  Pick up coins and stack.  Pick up coins one at a time until you get 5-10 in your hand, then move coins from palm to fingertips to stack one at a time.  Practice writing and/or typing.   Squeeze putty with whole hand.  Roll out putty into log and then pinch with each finger and thumb.  Place buttons/beads into putty and use only right hand to pull them out

## 2015-12-13 NOTE — Therapy (Signed)
[facility] [address] [place], [place], [address] Phone: [phone]   Fax:  [phone]  Physical Therapy Treatment  Patient Details  Name: Veronica Andrade MRN: [id number] Date of Birth: Apr 13, 1951 Referring Provider: Vernona Rieger, NP  Encounter Date: 12/13/2015      PT End of Session - 12/13/15 0851    Visit Number 3   Number of Visits 9   Date for PT Re-Evaluation 01/04/16   Authorization Type UMR, no visit limit, 20.00 copay   PT Start Time 0845   PT Stop Time 0930   PT Time Calculation (min) 45 min   Activity Tolerance Patient tolerated treatment well   Behavior During Therapy [facility] for tasks assessed/performed      Past Medical History  Diagnosis Date  . Hypertension   . Hyperlipidemia     Past Surgical History  Procedure Laterality Date  . Knee arthroscopy Left   . Tubal ligation    . Bladder surgery      Bladder Tack    There were no vitals filed for this visit.  Visit Diagnosis:  Abnormality of gait  Unsteadiness  Right leg weakness      Subjective Assessment - 12/13/15 0850    Subjective "I'm doing my exercises, but the ones where my eyes are closed, I feel crazy."    Limitations House hold activities;Walking   Patient Stated Goals "I want to have full use of my arm, my hand and my R side."    Currently in Pain? No/denies               NMR:  Worked on high level balance with gait x 500' with varying speeds, changing directions, backwards walking, braiding, tandem walking at S to mod I level with cues for slower speed when tandem walking for increased challenge.  Tolerated well with no increase in dizziness and no LOB. Progressed to activity in // bars while standing on foam balance beam in tandem (did alternating LE) x 2 reps of 30 secs each with eyes open.  Note moderate difficulty, however she was able to complete at S level without overt LOB.  Then had pt stand on foam  balance beam with feet side by side, EO initially progressing to eyes closed x 2 reps of 30 secs each.  Also performed RLE standing on foam beam tapping LLE up to chair and back down x 10 reps (5 with UE support and 5 without UE support) and vice versa.  Provided education that she could do modified version of this at home (standing on pillows by counter top and stepping to smaller step stool) due to motivation to continue to improve.  Progressed to standing on blue mat on ramp, facing decline marching x 20 reps in slow fashion to increase time in SLS, progressing to semi tandem x 30 secs x 2 reps with EC.  Tolerated well with min/guard A to prevent overt LOB forwards.  Ended session with trial on treadmill x 3 mins at 2.0 mph without UE support at mod I level.  Feel that due to high level, pt able to return to gym with husband, but educated to take slow, take breaks and perform intervals of cardio with light weight training to decrease fatigue.  Pt verbalized understanding.                   PT Education - 12/13/15 0851    Education provided Yes   Education Details high  level balance at home, returning to gym with husband   Person(s) Educated Patient   Methods Explanation   Comprehension Verbalized understanding             PT Long Term Goals - 12/05/15 1003    PT LONG TERM GOAL #1   Title Pt will be independent with HEP for balance, strengthening, and vestibular exercises to indicate improved balance and functional mobility. (Target Date: 01/02/16)   PT LONG TERM GOAL #2   Title Pt will increase FGA to 25/30 in order to indicate improved functional balance and decreased fall risk.     PT LONG TERM GOAL #3   Title Pt will report no more than 2 point increase in dizziness with all functional mobility to indicate improved mobility.     PT LONG TERM GOAL #4   Title Pt will ambulate on all outdoor surfaces at mod I level in order to indicate safe return to community mobility.     PT  LONG TERM GOAL #5   Title Pt will perform 5 steps without rail while carrying object at mod I level in order to indicate safe home/community negotiation.                 Plan - 12/13/15 1610    Clinical Impression Statement Skilled session focused on high level balance and gait activities with vestibular challenges.   Tolerated very well during session and remains very motivated to return to PLOF.   Also assessed gait on treadmill for pt to return to gym with husband.  Feel that she can return at slow pace.    Pt will benefit from skilled therapeutic intervention in order to improve on the following deficits Abnormal gait;Decreased activity tolerance;Decreased balance;Decreased endurance;Decreased mobility;Decreased strength;Dizziness;Impaired perceived functional ability;Impaired flexibility;Impaired sensation;Impaired UE functional use;Improper body mechanics;Postural dysfunction   Rehab Potential Excellent   PT Frequency 2x / week   PT Duration 4 weeks   PT Treatment/Interventions ADLs/Self Care Home Management;Electrical Stimulation;Gait training;Stair training;Functional mobility training;Therapeutic activities;Therapeutic exercise;Balance training;Neuromuscular re-education;Patient/family education;Orthotic Fit/Training;Vestibular   PT Next Visit Plan RLE strengthening, high level balance and vestibular challenges   Consulted and Agree with Plan of Care Patient        Problem List Patient Active Problem List   Diagnosis Date Noted  . 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 HLD (hyperlipidemia) 04/30/2009  . OTHER MALAISE AND FATIGUE 04/25/2009  . Essential hypertension 03/15/2009  . Allergic rhinitis 03/15/2009  . URINARY INCONTINENCE 03/15/2009    Harriet Butte, PT, MPT [facility] [address] [place], [place], [address] Phone: [phone]   Fax:  [phone] 12/13/2015, 9:48  AM  Name: Veronica Andrade MRN: [id number] Date of Birth: 08-Sep-1951

## 2015-12-13 NOTE — Therapy (Signed)
[facility] [address] [place], [place], [address] Phone: [phone]   Fax:  [phone]  Occupational Therapy Treatment  Patient Details  Name: Veronica Andrade MRN: [id number] Date of Birth: [birth year] Referring Provider: Vernona Rieger  Encounter Date: 12/13/2015      OT End of Session - 12/13/15 0813    Visit Number 2   Number of Visits 17   Date for OT Re-Evaluation 02/07/16   Authorization Type [employer] - [employer] employee   OT Start Time [phone]   OT Stop Time 0845   OT Time Calculation (min) 42 min   Activity Tolerance Patient tolerated treatment well   Behavior During Therapy [facility] for tasks assessed/performed      Past Medical History  Diagnosis Date  . Hypertension   . Hyperlipidemia     Past Surgical History  Procedure Laterality Date  . Knee arthroscopy Left   . Tubal ligation    . Bladder surgery      Bladder Tack    There were no vitals filed for this visit.  Visit Diagnosis:  Lack of coordination due to stroke  Hemiplegia and hemiparesis following cerebral infarction affecting right dominant side (HCC)  Right-sided lack of sensation      Subjective Assessment - 12/13/15 0805    Subjective  Nothing new   Patient Stated Goals I want to go back to work    Currently in Pain? No/denies                      OT Treatments/Exercises (OP) - 12/13/15 0001    ADLs   Work Practiced typing R-handed typing words with incr time and approx 65-70% accuracy.  Recommended pt practice typing at home and play free typing games                OT Education - 12/13/15 0841    Education provided Yes   Education Details Coordination/Red putty HEP; R-handed typing words; Avoiding shoulder hike during coordination activities   Person(s) Educated Patient   Methods Explanation;Demonstration;Verbal cues;Handout   Comprehension Verbalized understanding;Returned demonstration;Verbal  cues required          OT Short Term Goals - 12/11/15 1412    OT SHORT TERM GOAL #1   Title Pt independent with HEP (due 01/10/16)   Time 4   Period Weeks   Status New   OT SHORT TERM GOAL #2   Title Pt to eat 90% with Rt dominant hand   Baseline 65%   Time 4   Period Weeks   Status New   OT SHORT TERM GOAL #3   Title Pt to apply makeup 50% of time with Rt dominant hand   Baseline all with Lt hand   Time 4   Period Weeks   Status New   OT SHORT TERM GOAL #4   Title Improve coordination as evidenced by performing 9 hole peg test in 28 sec. or less Rt hand   Baseline 38.43 sec. (Lt = 20.12 sec)   Time 4   Period Weeks   Status New   OT SHORT TERM GOAL #5   Title Improve grip strength to 58 lbs or greater   Baseline 52 lbs (Lt = 62 lbs)    Time 4   Period Weeks   Status New   Additional Short Term Goals   Additional Short Term Goals Yes   OT SHORT TERM GOAL #6   Title Improve  overall typing to 28 wpm with 80% accuracy or greater   Baseline 14-23 wpm, 24-66% accuracy   Time 4   Period Weeks   Status New   OT SHORT TERM GOAL #7   Title Pt to verbalize understanding with sensory impairments Rt hand and safety precautions needed   Time 4   Period Weeks   Status New           OT Long Term Goals - 12/11/15 1416    OT LONG TERM GOAL #1   Title Independent with updated HEP (DUE 02/07/16)    Time 8   Period Weeks   Status New   OT LONG TERM GOAL #2   Title Pt to perform all ADLS with Rt hand as dominant hand 90% of the time   Time 8   Period Weeks   Status New   OT LONG TERM GOAL #3   Title Pt to return to cooking at Mod I level    Time 8   Period Weeks   Status New   OT LONG TERM GOAL #4   Title Pt to write 1/2 page text in reasonable amt of time maintaining 90% legibility   Time 8   Period Weeks   Status New   OT LONG TERM GOAL #5   Title Improve overall typing to 35 wpm or > with 90% accuracy    Time 8   Period Weeks   Status New                Plan - 12/13/15 8119    Clinical Impression Statement Pt reports improving coordination with RUE.  However, pt needs mod   Plan continue with coordination, typing games   Consulted and Agree with Plan of Care Patient        Problem List Patient Active Problem List   Diagnosis Date Noted  . 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 HLD (hyperlipidemia) 04/30/2009  . OTHER MALAISE AND FATIGUE 04/25/2009  . Essential hypertension 03/15/2009  . Allergic rhinitis 03/15/2009  . URINARY INCONTINENCE 03/15/2009    [facility] 12/13/2015, 8:04 PM  [facility] [address] [place], [place], [address] Phone: [phone]   Fax:  [phone]  Name: Veronica Andrade MRN: [id number] Date of Birth: 08/28/1951  Willa Frater, OTR/L 12/13/2015 8:05 PM

## 2015-12-15 ENCOUNTER — Encounter: Payer: Self-pay | Admitting: Rehabilitation

## 2015-12-15 ENCOUNTER — Ambulatory Visit: Payer: 59 | Admitting: Rehabilitation

## 2015-12-15 DIAGNOSIS — R208 Other disturbances of skin sensation: Secondary | ICD-10-CM | POA: Diagnosis not present

## 2015-12-15 DIAGNOSIS — R29898 Other symptoms and signs involving the musculoskeletal system: Secondary | ICD-10-CM | POA: Diagnosis not present

## 2015-12-15 DIAGNOSIS — R269 Unspecified abnormalities of gait and mobility: Secondary | ICD-10-CM

## 2015-12-15 DIAGNOSIS — R2681 Unsteadiness on feet: Secondary | ICD-10-CM

## 2015-12-15 DIAGNOSIS — I69898 Other sequelae of other cerebrovascular disease: Secondary | ICD-10-CM | POA: Diagnosis not present

## 2015-12-15 DIAGNOSIS — R4189 Other symptoms and signs involving cognitive functions and awareness: Secondary | ICD-10-CM | POA: Diagnosis not present

## 2015-12-15 DIAGNOSIS — R279 Unspecified lack of coordination: Secondary | ICD-10-CM | POA: Diagnosis not present

## 2015-12-15 DIAGNOSIS — R42 Dizziness and giddiness: Secondary | ICD-10-CM | POA: Diagnosis not present

## 2015-12-15 DIAGNOSIS — I6980 Unspecified sequelae of other cerebrovascular disease: Secondary | ICD-10-CM | POA: Diagnosis not present

## 2015-12-15 NOTE — Therapy (Signed)
[facility] [address] [place], [place], [address] Phone: [phone]   Fax:  [phone]  Physical Therapy Treatment  Patient Details  Name: Veronica Andrade MRN: [id number] Date of Birth: [birth year] Referring Provider: Vernona Rieger, NP  Encounter Date: 12/15/2015      PT End of Session - 12/15/15 0849    Visit Number 4   Number of Visits 9   Date for PT Re-Evaluation 01/04/16   Authorization Type [employer], no visit limit, 20.00 copay   PT Start Time 0845   PT Stop Time 0930   PT Time Calculation (min) 45 min   Activity Tolerance Patient tolerated treatment well   Behavior During Therapy [facility] for tasks assessed/performed      Past Medical History  Diagnosis Date  . Hypertension   . Hyperlipidemia     Past Surgical History  Procedure Laterality Date  . Knee arthroscopy Left   . Tubal ligation    . Bladder surgery      Bladder Tack    There were no vitals filed for this visit.  Visit Diagnosis:  Abnormality of gait  Unsteadiness  Right leg weakness      Subjective Assessment - 12/15/15 0848    Subjective "My husband and I walked a mile yesterday"    Limitations House hold activities;Walking   Patient Stated Goals "I want to have full use of my arm, my hand and my R side."    Currently in Pain? No/denies            NMR: Continue to work on high level balance and vestibular challenge in // bars.  Standing on large rocker board with feet slight narrower than shoulder width, maintaining balance with eyes open x 30 secs, EC x 2 reps of 30 secs with light cues for posterior weight shift.  Progressed to performing ball toss back and fourth on rocker board>varying positions to increase challenge.  Increased challenge by standing on BOSU ball (black top up), again maintaining balance x 30 secs, progressing to ball toss back and fourth x 10 reps and then to varying positions x 15 reps.  Tolerated both  well with no more than min/guard A intermittently but no overt LOB.  Then performed balance master task with responsive floor while tapping to targets placed in surround x 2 mins, another 2 mins while making words>responsive floor and surround tapping to targets x 2 mins.  Note pt with increased LOB with responsive surround and floor, but did better with slower movements.  Ended in Dentist with limits of stability shifting from center to center posterior x 2 mins with 15 sec hold to increase awareness of body. Ended session with assessment of corner balance tasks.  Pt able to perform R SLS on pillows for up to 10 secs.  Encouraged her to continue working on this at home.  Also note that she continues to have increased sway with feet together, EC and with head turns.   Pt verbalized understanding regarding HEP.                      PT Education - 12/15/15 0849    Education provided Yes   Education Details addition of single limb stance on pillows at home   Person(s) Educated Patient   Methods Explanation   Comprehension Verbalized understanding             PT Long Term Goals - 12/05/15 1003  PT LONG TERM GOAL #1   Title Pt will be independent with HEP for balance, strengthening, and vestibular exercises to indicate improved balance and functional mobility. (Target Date: 01/02/16)   PT LONG TERM GOAL #2   Title Pt will increase FGA to 25/30 in order to indicate improved functional balance and decreased fall risk.     PT LONG TERM GOAL #3   Title Pt will report no more than 2 point increase in dizziness with all functional mobility to indicate improved mobility.     PT LONG TERM GOAL #4   Title Pt will ambulate on all outdoor surfaces at mod I level in order to indicate safe return to community mobility.     PT LONG TERM GOAL #5   Title Pt will perform 5 steps without rail while carrying object at mod I level in order to indicate safe home/community negotiation.                  Plan - 12/15/15 0849    Clinical Impression Statement Skilled session focused on high level balance and gait activities with vestibular challenges. Tolerated very well during session and remains very motivated to return to PLOF.  Pt stating that she has returned to walking outdoors with husband and plans to attend gym next week.    Pt will benefit from skilled therapeutic intervention in order to improve on the following deficits Abnormal gait;Decreased activity tolerance;Decreased balance;Decreased endurance;Decreased mobility;Decreased strength;Dizziness;Impaired perceived functional ability;Impaired flexibility;Impaired sensation;Impaired UE functional use;Improper body mechanics;Postural dysfunction   Rehab Potential Excellent   PT Frequency 2x / week   PT Duration 4 weeks   PT Treatment/Interventions ADLs/Self Care Home Management;Electrical Stimulation;Gait training;Stair training;Functional mobility training;Therapeutic activities;Therapeutic exercise;Balance training;Neuromuscular re-education;Patient/family education;Orthotic Fit/Training;Vestibular   PT Next Visit Plan RLE strengthening, high level balance and vestibular challenges   Consulted and Agree with Plan of Care Patient        Problem List Patient Active Problem List   Diagnosis Date Noted  . 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 HLD (hyperlipidemia) 04/30/2009  . OTHER MALAISE AND FATIGUE 04/25/2009  . Essential hypertension 03/15/2009  . Allergic rhinitis 03/15/2009  . URINARY INCONTINENCE 03/15/2009    Harriet Butte, PT, MPT [facility] [address] [place], [place], [address] Phone: [phone]   Fax:  [phone] 12/15/2015, 2:18 PM  Name: Veronica Andrade MRN: [id number] Date of Birth: 1951-02-23

## 2015-12-18 ENCOUNTER — Ambulatory Visit: Payer: 59 | Admitting: Physical Therapy

## 2015-12-18 ENCOUNTER — Ambulatory Visit: Payer: 59 | Admitting: Occupational Therapy

## 2015-12-18 DIAGNOSIS — IMO0002 Reserved for concepts with insufficient information to code with codable children: Secondary | ICD-10-CM

## 2015-12-18 DIAGNOSIS — I6980 Unspecified sequelae of other cerebrovascular disease: Secondary | ICD-10-CM | POA: Diagnosis not present

## 2015-12-18 DIAGNOSIS — R2681 Unsteadiness on feet: Secondary | ICD-10-CM | POA: Diagnosis not present

## 2015-12-18 DIAGNOSIS — R208 Other disturbances of skin sensation: Secondary | ICD-10-CM | POA: Diagnosis not present

## 2015-12-18 DIAGNOSIS — R269 Unspecified abnormalities of gait and mobility: Secondary | ICD-10-CM | POA: Diagnosis not present

## 2015-12-18 DIAGNOSIS — I69351 Hemiplegia and hemiparesis following cerebral infarction affecting right dominant side: Secondary | ICD-10-CM

## 2015-12-18 DIAGNOSIS — R42 Dizziness and giddiness: Secondary | ICD-10-CM | POA: Diagnosis not present

## 2015-12-18 DIAGNOSIS — R449 Unspecified symptoms and signs involving general sensations and perceptions: Secondary | ICD-10-CM

## 2015-12-18 DIAGNOSIS — I69898 Other sequelae of other cerebrovascular disease: Secondary | ICD-10-CM | POA: Diagnosis not present

## 2015-12-18 DIAGNOSIS — R29898 Other symptoms and signs involving the musculoskeletal system: Secondary | ICD-10-CM | POA: Diagnosis not present

## 2015-12-18 DIAGNOSIS — R4189 Other symptoms and signs involving cognitive functions and awareness: Secondary | ICD-10-CM | POA: Diagnosis not present

## 2015-12-18 DIAGNOSIS — R279 Unspecified lack of coordination: Secondary | ICD-10-CM | POA: Diagnosis not present

## 2015-12-18 NOTE — Therapy (Signed)
[facility] [address] [place], [place], [address] Phone: [phone]   Fax:  [phone]  Physical Therapy Treatment  Patient Details  Name: Veronica Andrade MRN: [id number] Date of Birth: [birth year] Referring Provider: Vernona Rieger, NP  Encounter Date: 12/18/2015      PT End of Session - 12/18/15 1216    Visit Number 5   Number of Visits 9   Date for PT Re-Evaluation 01/04/16   Authorization Type U[employer], no visit limit, 20.00 copay   PT Start Time 1104   PT Stop Time 1145   PT Time Calculation (min) 41 min   Activity Tolerance Patient tolerated treatment well   Behavior During Therapy [facility] for tasks assessed/performed      Past Medical History  Diagnosis Date  . Hypertension   . Hyperlipidemia     Past Surgical History  Procedure Laterality Date  . Knee arthroscopy Left   . Tubal ligation    . Bladder surgery      Bladder Tack    There were no vitals filed for this visit.  Visit Diagnosis:  Unsteadiness  Right leg weakness  Hemiplegia and hemiparesis following cerebral infarction affecting right dominant side (HCC)      Subjective Assessment - 12/18/15 1107    Subjective "I practiced standing on the pillow with my eyes closed and I still haven't mastered that." Pt walked about 2 miles with husband yesterday.   Limitations House hold activities;Walking   Patient Stated Goals "I want to have full use of my arm, my hand and my R side."    Currently in Pain? No/denies            [facility] PT Assessment - 12/18/15 0001    ROM / Strength   AROM / PROM / Strength AROM   Strength   Overall Strength Comments 4/5 R hip ABD; 3+/5 (within available ROM); 4/5 L hip extension.    Flexibility   Soft Tissue Assessment /Muscle Length yes   Quadriceps Maisie Fus Test reveals limited extensibility of R iliopsoas and R rectus femoris.                     [facility] Adult PT Treatment/Exercise -  12/18/15 0001    Exercises   Exercises --   Other Exercises  Supine, hook lying self-stretch of R iliopsoas and R rectus femoris 2 x60-second holds each with cueing from this PT (added to HEP). Modiified RLE single limb bridging with RLE extended off EOM x10 reps (LLE on rolling chair to decrease compensation with LLE); R single limb bridging x5 reps (ended due to poor body mechanics; hips "twisting" during bridging); bridging (BLE's) x20 reps; prone hip extension with knee flexion to isolate gluteus maximus (to pt fatigue) x12 reps on R, x8 reps on L.             Balance Exercises - 12/18/15 1208    Balance Exercises: Standing   Standing Eyes Opened Narrow base of support (BOS);Head turns;Other reps (comment)  vert/horizontal x5 reps; diag. head turns x10 each direction   Standing Eyes Closed Wide (BOA);Head turns;Foam/compliant surface;Other reps (comment)  x15 reps; downgraded to 1 pillow   SLS Eyes open;Foam/compliant surface;Other (comment);10 secs;4 reps  2 pillows           PT Education - 12/18/15 1207    Education provided Yes   Education Details HEP: added R hip strengthening; see Pt Instructions.   Person(s) Educated Patient  Methods Explanation;Demonstration;Tactile cues;Verbal cues;Handout   Comprehension Verbalized understanding;Returned demonstration             PT Long Term Goals - 12/05/15 1003    PT LONG TERM GOAL #1   Title Pt will be independent with HEP for balance, strengthening, and vestibular exercises to indicate improved balance and functional mobility. (Target Date: 01/02/16)   PT LONG TERM GOAL #2   Title Pt will increase FGA to 25/30 in order to indicate improved functional balance and decreased fall risk.     PT LONG TERM GOAL #3   Title Pt will report no more than 2 point increase in dizziness with all functional mobility to indicate improved mobility.     PT LONG TERM GOAL #4   Title Pt will ambulate on all outdoor surfaces at mod I level  in order to indicate safe return to community mobility.     PT LONG TERM GOAL #5   Title Pt will perform 5 steps without rail while carrying object at mod I level in order to indicate safe home/community negotiation.                 Plan - 12/18/15 1217    Clinical Impression Statement Session focused on improving R gluteus maximus activation and motor control to promote more normalized gait pattern, increase R single limb stance stability. Added R hip flexor stretch and prone R hip extension to HEP. Pt performed all corner balance home exercises without handout requiring only subtle to min cueing for technique.   Pt will benefit from skilled therapeutic intervention in order to improve on the following deficits Abnormal gait;Decreased activity tolerance;Decreased balance;Decreased endurance;Decreased mobility;Decreased strength;Dizziness;Impaired perceived functional ability;Impaired flexibility;Impaired sensation;Impaired UE functional use;Improper body mechanics;Postural dysfunction   Rehab Potential Excellent   PT Frequency 2x / week   PT Duration 4 weeks   PT Treatment/Interventions ADLs/Self Care Home Management;Electrical Stimulation;Gait training;Stair training;Functional mobility training;Therapeutic activities;Therapeutic exercise;Balance training;Neuromuscular re-education;Patient/family education;Orthotic Fit/Training;Vestibular   PT Next Visit Plan Continue RLE strengthening, high level balance and vestibular challenges. Consider adding R hip ABD strengthening to HEP.   Consulted and Agree with Plan of Care Patient        Problem List Patient Active Problem List   Diagnosis Date Noted  . 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 HLD (hyperlipidemia) 04/30/2009  . OTHER MALAISE AND FATIGUE 04/25/2009  . Essential hypertension 03/15/2009  . Allergic rhinitis 03/15/2009  . URINARY INCONTINENCE 03/15/2009    Jorje Guild,  PT, DPT [facility] [address] [place], [place], [address] Phone: [phone]   Fax:  [phone] 12/18/2015, 12:20 PM  Name: Veronica Andrade MRN: [id number] Date of Birth: 02-10-1951

## 2015-12-18 NOTE — Therapy (Signed)
[facility] [address] [place], [place], [address] Phone: [phone]   Fax:  [phone]  Occupational Therapy Treatment  Patient Details  Name: Veronica Andrade MRN: [id number] Date of Birth: Nov 23, 1951 Referring Provider: Vernona Rieger  Encounter Date: 12/18/2015      OT End of Session - 12/18/15 1227    Visit Number 3   Number of Visits 17   Date for OT Re-Evaluation 02/07/16   Authorization Type [employer] - [employer] employee   OT Start Time 1145   OT Stop Time 1230   OT Time Calculation (min) 45 min   Activity Tolerance Patient tolerated treatment well      Past Medical History  Diagnosis Date  . Hypertension   . Hyperlipidemia     Past Surgical History  Procedure Laterality Date  . Knee arthroscopy Left   . Tubal ligation    . Bladder surgery      Bladder Tack    There were no vitals filed for this visit.  Visit Diagnosis:  Hemiplegia and hemiparesis following cerebral infarction affecting right dominant side (HCC)  Lack of coordination due to stroke  Right-sided lack of sensation      Subjective Assessment - 12/18/15 1154    Subjective  I've been practicing my ex's   Patient Stated Goals I want to go back to work    Currently in Pain? No/denies                      OT Treatments/Exercises (OP) - 12/18/15 0001    ADLs   Writing Pt practiced writing paragraph at approx. 80% legibility. (Pt also used built up pen, but didn't find it helpful, therefore switched back to regular pen)   ADL Comments Reviewed STG's/LTG's with patient from evaluation at beginning of session. Discussed safety considerations/tips with lack of sensation Rt hand including: with childcare tasks, prep for cooking, testing temperature of water with other hand for showering/washing dishes, etc and avoiding holding heavy, hot, sharp or breakable objects in RT hand until sensation improves.    Fine Motor  Coordination   Fine Motor Coordination Small Pegboard   Small Pegboard Pt attempting to manipulate small pegs up to 5 pegs at a time in Rt hand in prep for placement in pegboard with multiple drops and assist often from Lt hand. Pt copied design with only min cues initially and 1 error in copying design. Max difficulty and extra time with task                                         OT Short Term Goals - 12/11/15 1412    OT SHORT TERM GOAL #1   Title Pt independent with HEP (due 01/10/16)   Time 4   Period Weeks   Status New   OT SHORT TERM GOAL #2   Title Pt to eat 90% with Rt dominant hand   Baseline 65%   Time 4   Period Weeks   Status New   OT SHORT TERM GOAL #3   Title Pt to apply makeup 50% of time with Rt dominant hand   Baseline all with Lt hand   Time 4   Period Weeks   Status New   OT SHORT TERM GOAL #4   Title Improve coordination as evidenced by performing 9 hole peg test in  28 sec. or less Rt hand   Baseline 38.43 sec. (Lt = 20.12 sec)   Time 4   Period Weeks   Status New   OT SHORT TERM GOAL #5   Title Improve grip strength to 58 lbs or greater   Baseline 52 lbs (Lt = 62 lbs)    Time 4   Period Weeks   Status New   Additional Short Term Goals   Additional Short Term Goals Yes   OT SHORT TERM GOAL #6   Title Improve overall typing to 28 wpm with 80% accuracy or greater   Baseline 14-23 wpm, 24-66% accuracy   Time 4   Period Weeks   Status New   OT SHORT TERM GOAL #7   Title Pt to verbalize understanding with sensory impairments Rt hand and safety precautions needed   Time 4   Period Weeks   Status New           OT Long Term Goals - 12/11/15 1416    OT LONG TERM GOAL #1   Title Independent with updated HEP (DUE 02/07/16)    Time 8   Period Weeks   Status New   OT LONG TERM GOAL #2   Title Pt to perform all ADLS with Rt hand as dominant hand 90% of the time   Time 8   Period Weeks   Status New   OT LONG TERM GOAL #3   Title  Pt to return to cooking at Mod I level    Time 8   Period Weeks   Status New   OT LONG TERM GOAL #4   Title Pt to write 1/2 page text in reasonable amt of time maintaining 90% legibility   Time 8   Period Weeks   Status New   OT LONG TERM GOAL #5   Title Improve overall typing to 35 wpm or > with 90% accuracy    Time 8   Period Weeks   Status New               Plan - 12/18/15 1227    Clinical Impression Statement Pt required reminders to relax Rt shoulder throughout session. Pt with difficulty and drops with in hand manipulation tasks   Plan typing games, gripper activity   OT Home Exercise Plan 12/13/15: coordination and putty HEP, Rt handed typing words.   Consulted and Agree with Plan of Care Patient        Problem List Patient Active Problem List   Diagnosis Date Noted  . 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 HLD (hyperlipidemia) 04/30/2009  . OTHER MALAISE AND FATIGUE 04/25/2009  . Essential hypertension 03/15/2009  . Allergic rhinitis 03/15/2009  . URINARY INCONTINENCE 03/15/2009    Kelli Churn, OTR/L 12/18/2015, 12:30 PM  [place] [address] [place], [place], [address] Phone: [phone]   Fax:  [phone]  Name: Veronica Andrade MRN: [id number] Date of Birth: August 19, 1951

## 2015-12-18 NOTE — Patient Instructions (Addendum)
Feet Together, Head Motion - Eyes Closed    Stand in corner with chair in front of you. With eyes closed and feet together, move head slowly diagonally from up/right to down/left 15 reps; then diagonally from up/left to down/right. Repeat __2__ times per session. Do __1-2__ sessions per day.  Copyright  VHI. All rights reserved.   Feet Apart (Compliant Surface) Arm Motion - Eyes Closed    Stand on compliant surface: _2 stacked pillows_______, feet shoulder width apart. Close eyes and keep arms by your side. Hold for 30 secs.  Repeat __3__ times per session. Do __1-2__ sessions per day.  Copyright  VHI. All rights reserved.   Feet Apart (Compliant Surface) Head Motion - Eyes Closed    Stand on compliant surface: _1 pillow______ with feet shoulder width apart. Close eyes and move head slowly, up and down x 15 reps. Repeat with head turns side to side x 15 reps.  Repeat _2___ times per session. Do __1-2__ sessions per day.   Single Leg (Compliant Surface) - Eyes Open    Stand on 2 pillows with back to corner (as practiced in therapy). Lift right leg while maintaining balance over other leg. Progress to removing hands from support surface for longer periods of time. Hold__10__ seconds. Repeat __5__ times per leg. Do _1-2___ sessions per day.  Hip Flexor Stretch    Lying on back with your RIGHT leg near edge of bed, bend left leg, foot flat. Hang RIGHT leg over edge, relaxed, with thigh hanging over edge of bed/couch.  You should feel a gentle stretch in front of your right thigh/hip. Hold for 60 seconds, 2-3 times per day on the right leg.   HIP: Extension / KNEE: Flexion - Prone -RIGHT    Bend knee, squeeze glutes. Raise leg up. Perform 8 reps on the RIGHT. Perform 12 on the LEFT. Perform 2-3 times per day.   Copyright  VHI. All rights reserved.

## 2015-12-20 ENCOUNTER — Ambulatory Visit: Payer: 59 | Admitting: Occupational Therapy

## 2015-12-20 ENCOUNTER — Encounter: Payer: Self-pay | Admitting: Rehabilitation

## 2015-12-20 ENCOUNTER — Ambulatory Visit: Payer: 59 | Admitting: Rehabilitation

## 2015-12-20 DIAGNOSIS — I69898 Other sequelae of other cerebrovascular disease: Secondary | ICD-10-CM | POA: Diagnosis not present

## 2015-12-20 DIAGNOSIS — R4189 Other symptoms and signs involving cognitive functions and awareness: Secondary | ICD-10-CM

## 2015-12-20 DIAGNOSIS — R29898 Other symptoms and signs involving the musculoskeletal system: Secondary | ICD-10-CM

## 2015-12-20 DIAGNOSIS — R449 Unspecified symptoms and signs involving general sensations and perceptions: Secondary | ICD-10-CM

## 2015-12-20 DIAGNOSIS — R2681 Unsteadiness on feet: Secondary | ICD-10-CM

## 2015-12-20 DIAGNOSIS — R269 Unspecified abnormalities of gait and mobility: Secondary | ICD-10-CM | POA: Diagnosis not present

## 2015-12-20 DIAGNOSIS — R279 Unspecified lack of coordination: Secondary | ICD-10-CM | POA: Diagnosis not present

## 2015-12-20 DIAGNOSIS — I6980 Unspecified sequelae of other cerebrovascular disease: Secondary | ICD-10-CM | POA: Diagnosis not present

## 2015-12-20 DIAGNOSIS — IMO0002 Reserved for concepts with insufficient information to code with codable children: Secondary | ICD-10-CM

## 2015-12-20 DIAGNOSIS — R42 Dizziness and giddiness: Secondary | ICD-10-CM | POA: Diagnosis not present

## 2015-12-20 DIAGNOSIS — R208 Other disturbances of skin sensation: Secondary | ICD-10-CM | POA: Diagnosis not present

## 2015-12-20 NOTE — Therapy (Signed)
[facility] [address] [place], [place], [address] Phone: [phone]   Fax:  [phone]  Occupational Therapy Treatment  Patient Details  Name: Veronica Andrade MRN: [id number] Date of Birth: 1951-03-10 Referring Provider: Vernona Rieger  Encounter Date: 12/20/2015      OT End of Session - 12/20/15 1235    Visit Number 4   Number of Visits 17   Date for OT Re-Evaluation 02/07/16   Authorization Type [employer] - [employer] [place] employee   OT Start Time 1145   OT Stop Time 1230   OT Time Calculation (min) 45 min   Activity Tolerance Patient tolerated treatment well      Past Medical History  Diagnosis Date  . Hypertension   . Hyperlipidemia     Past Surgical History  Procedure Laterality Date  . Knee arthroscopy Left   . Tubal ligation    . Bladder surgery      Bladder Tack    There were no vitals filed for this visit.  Visit Diagnosis:  Lack of coordination due to stroke  Right-sided lack of sensation  Weakness of right hand      Subjective Assessment - 12/20/15 1146    Subjective  I had an old rotator cuff injury with impingement Rt shoulder, but it wasn't torn,  and got cortisone injections (approx. 7-8 years)    Patient Stated Goals I want to go back to work    Currently in Pain? Yes   Pain Score 5    Pain Location Shoulder   Pain Orientation Right   Pain Descriptors / Indicators Sore   Pain Type Acute pain   Pain Onset 1 to 4 weeks ago   Pain Frequency Intermittent   Aggravating Factors  certain movements   Pain Relieving Factors rest                      OT Treatments/Exercises (OP) - 12/20/15 0001    Exercises   Exercises Hand   Hand Exercises   Other Hand Exercises Gripper set at 35 lbs resistance for sustained grip strength and sensory feedback Rt hand with min drops without rest break   Fine Motor Coordination   Other Fine Motor Exercises Typing games: bubbles, clouds  with #'s and words in prep for work related tasks on computer.    Other Fine Motor Exercises Translating stress balls in Rt hand for in hand manipulation with mod difficulty and drops. Noted 4th/5th digits not coordinated together for this activity and pt reports these 2 digits are the hardest for typing as well.                   OT Short Term Goals - 12/11/15 1412    OT SHORT TERM GOAL #1   Title Pt independent with HEP (due 01/10/16)   Time 4   Period Weeks   Status New   OT SHORT TERM GOAL #2   Title Pt to eat 90% with Rt dominant hand   Baseline 65%   Time 4   Period Weeks   Status New   OT SHORT TERM GOAL #3   Title Pt to apply makeup 50% of time with Rt dominant hand   Baseline all with Lt hand   Time 4   Period Weeks   Status New   OT SHORT TERM GOAL #4   Title Improve coordination as evidenced by performing 9 hole peg test in 28 sec.  or less Rt hand   Baseline 38.43 sec. (Lt = 20.12 sec)   Time 4   Period Weeks   Status New   OT SHORT TERM GOAL #5   Title Improve grip strength to 58 lbs or greater   Baseline 52 lbs (Lt = 62 lbs)    Time 4   Period Weeks   Status New   Additional Short Term Goals   Additional Short Term Goals Yes   OT SHORT TERM GOAL #6   Title Improve overall typing to 28 wpm with 80% accuracy or greater   Baseline 14-23 wpm, 24-66% accuracy   Time 4   Period Weeks   Status New   OT SHORT TERM GOAL #7   Title Pt to verbalize understanding with sensory impairments Rt hand and safety precautions needed   Time 4   Period Weeks   Status New           OT Long Term Goals - 12/11/15 1416    OT LONG TERM GOAL #1   Title Independent with updated HEP (DUE 02/07/16)    Time 8   Period Weeks   Status New   OT LONG TERM GOAL #2   Title Pt to perform all ADLS with Rt hand as dominant hand 90% of the time   Time 8   Period Weeks   Status New   OT LONG TERM GOAL #3   Title Pt to return to cooking at Mod I level    Time 8   Period  Weeks   Status New   OT LONG TERM GOAL #4   Title Pt to write 1/2 page text in reasonable amt of time maintaining 90% legibility   Time 8   Period Weeks   Status New   OT LONG TERM GOAL #5   Title Improve overall typing to 35 wpm or > with 90% accuracy    Time 8   Period Weeks   Status New               Plan - 12/20/15 1236    Clinical Impression Statement Pt progressing with typing and coordination but difficulty with in hand manipulation. Pt also with some soreness in Rt shoulder today   Plan Pt to bring in theraband HEP from years ago to strengthen Rt shoulder and see if still appropriate and/or update and modify prn   OT Home Exercise Plan 12/13/15: coordination and putty HEP, Rt handed typing words.   Consulted and Agree with Plan of Care Patient        Problem List Patient Active Problem List   Diagnosis Date Noted  . 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 HLD (hyperlipidemia) 04/30/2009  . OTHER MALAISE AND FATIGUE 04/25/2009  . Essential hypertension 03/15/2009  . Allergic rhinitis 03/15/2009  . URINARY INCONTINENCE 03/15/2009    Veronica Andrade, OTR/L 12/20/2015, 12:38 PM  [facility] [address] [place], [place], [address] Phone: [phone]   Fax:  [phone]  Name: Veronica Andrade MRN: [id number] Date of Birth: [birth year]

## 2015-12-20 NOTE — Therapy (Signed)
[facility] [address] [place], [place], [address] Phone: [phone]   Fax:  [phone]  Physical Therapy Treatment  Patient Details  Name: Veronica Andrade MRN: [id number] Date of Birth: 1951-05-02 Referring Provider: Vernona Rieger, NP  Encounter Date: 12/20/2015      PT End of Session - 12/20/15 1106    Visit Number 6   Number of Visits 9   Date for PT Re-Evaluation 01/04/16   Authorization Type [employer], no visit limit, 20.00 copay   PT Start Time 1100   PT Stop Time 1145   PT Time Calculation (min) 45 min   Activity Tolerance Patient tolerated treatment well   Behavior During Therapy [facility] for tasks assessed/performed      Past Medical History  Diagnosis Date  . Hypertension   . Hyperlipidemia     Past Surgical History  Procedure Laterality Date  . Knee arthroscopy Left   . Tubal ligation    . Bladder surgery      Bladder Tack    There were no vitals filed for this visit.  Visit Diagnosis:  Unsteadiness  Abnormality of gait  Right leg weakness      Subjective Assessment - 12/20/15 1104    Subjective "I went to the gym yesterday and walked for 30 mins on the treadmill and rode the bike for 10 mins.'  "I think I over did it because I am sore."    Limitations House hold activities;Walking   Patient Stated Goals "I want to have full use of my arm, my hand and my R side."    Currently in Pain? Yes   Pain Score 6    Pain Location Groin   Pain Orientation Right   Pain Descriptors / Indicators Sore;Tightness   Pain Type Acute pain   Pain Onset In the past 7 days   Pain Frequency Intermittent   Aggravating Factors  certain exercise   Pain Relieving Factors rest          TE:  Addressed R groin tightness from last visit with adductor stretch.  Demonstrated three different ways; seated circle sitting with feet together, pressing on knees as able to increase stretch x 60 secs.  Also performed in  standing with L lateral lunge for R groin stretch x 30 secs (unable to tolerate) and supine in hooklying position with knees to the side, again adding over pressure as able to R knee if needing increased stretch.  Provided for HEP.  Also performed SL clam exercise to increase hip abd strength.  Performed x 10 reps on each side and provided for HEP.    NMR:  Went over corner balance tasks while standing on pillow feet together performing head turns in diagonals x 10 reps each direction.  Continue to note difficulty, however she has very little difficulty with wider BOS, therefore continue to encourage her to perform with feet together for increased challenge.  Note she was standing on two couch cushions at home and noting that exercises not getting easier, therefore recommended she only use single cushion.  Pt verbalized understanding.  Progressed to gait around track (115') with vertical ball toss with emphasis on pt moving eyes and head with ball.  She was able to perform with only mild gait and balance deviations.  Then performed gait while moving ball in circular (counter clockwise and clockwise) motions during gait (each direction x 115').  Note increased difficulty when moving ball clockwise with balance.  PT Education - 12/20/15 1106    Education provided Yes   Education Details additions to HEP, see pt instruction   Person(s) Educated Patient   Methods Explanation   Comprehension Verbalized understanding             PT Long Term Goals - 12/05/15 1003    PT LONG TERM GOAL #1   Title Pt will be independent with HEP for balance, strengthening, and vestibular exercises to indicate improved balance and functional mobility. (Target Date: 01/02/16)   PT LONG TERM GOAL #2   Title Pt will increase FGA to 25/30 in order to indicate improved functional balance and decreased fall risk.     PT LONG TERM GOAL #3   Title Pt will report no more than 2 point  increase in dizziness with all functional mobility to indicate improved mobility.     PT LONG TERM GOAL #4   Title Pt will ambulate on all outdoor surfaces at mod I level in order to indicate safe return to community mobility.     PT LONG TERM GOAL #5   Title Pt will perform 5 steps without rail while carrying object at mod I level in order to indicate safe home/community negotiation.                 Plan - 12/20/15 1106    Clinical Impression Statement Skilled session focused on stretching program for R adductor with addition of abductor strengthening and modification of R hip flex stretch, see pt instruction.  Also continue to work on high level balance and vestibular tasks.  Tolerated well.     Pt will benefit from skilled therapeutic intervention in order to improve on the following deficits Abnormal gait;Decreased activity tolerance;Decreased balance;Decreased endurance;Decreased mobility;Decreased strength;Dizziness;Impaired perceived functional ability;Impaired flexibility;Impaired sensation;Impaired UE functional use;Improper body mechanics;Postural dysfunction   Rehab Potential Excellent   PT Frequency 2x / week   PT Duration 4 weeks   PT Treatment/Interventions ADLs/Self Care Home Management;Electrical Stimulation;Gait training;Stair training;Functional mobility training;Therapeutic activities;Therapeutic exercise;Balance training;Neuromuscular re-education;Patient/family education;Orthotic Fit/Training;Vestibular   PT Next Visit Plan Continue RLE strengthening, high level balance and vestibular challenges.    Consulted and Agree with Plan of Care Patient        Problem List Patient Active Problem List   Diagnosis Date Noted  . 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 HLD (hyperlipidemia) 04/30/2009  . OTHER MALAISE AND FATIGUE 04/25/2009  . Essential hypertension 03/15/2009  . Allergic rhinitis 03/15/2009  . URINARY  INCONTINENCE 03/15/2009    Harriet Butte, PT, MPT [facility] [address] [place], [place], [address] Phone: [phone]   Fax:  [phone] 12/20/2015, 12:49 PM  Name: Veronica Andrade MRN: [id number] Date of Birth: [birth year]

## 2015-12-20 NOTE — Patient Instructions (Signed)
Adductor Stretch - Supine    Lie on floor, knees bent, feet flat. Keeping feet together, lower knees toward floor until stretch felt at inner thighs. Hold for 60 secs. Do _2-3__ times per day.  Copyright  VHI. All rights reserved.   Abduction: Clam (Eccentric) - Side-Lying    Lie on side with knees bent. Lift top knee, keeping feet together. Keep trunk steady. Slowly lower for 3-5 seconds. _10__ reps per set, _1-2__ sets per day, _5__ days per week.   http://ecce.exer.us/65   Copyright  VHI. All rights reserved.   Modify edge of bed hip stretch, by lying on your bed and let R leg hang off the edge,  Slightly bend knee to a point where you feel the stretch on the front of your hip, but not in your groin.  Hold for 60 secs and do 2-3 times.

## 2015-12-21 NOTE — Telephone Encounter (Signed)
I spoke to pt and she was told by [employer] that she can FMLA can be filled out after she is seen 12-27-15.

## 2015-12-25 ENCOUNTER — Encounter: Payer: Self-pay | Admitting: Rehabilitation

## 2015-12-25 ENCOUNTER — Ambulatory Visit: Payer: 59 | Admitting: Occupational Therapy

## 2015-12-25 ENCOUNTER — Ambulatory Visit: Payer: 59 | Admitting: Rehabilitation

## 2015-12-25 DIAGNOSIS — I69898 Other sequelae of other cerebrovascular disease: Secondary | ICD-10-CM | POA: Diagnosis not present

## 2015-12-25 DIAGNOSIS — R2681 Unsteadiness on feet: Secondary | ICD-10-CM | POA: Diagnosis not present

## 2015-12-25 DIAGNOSIS — R269 Unspecified abnormalities of gait and mobility: Secondary | ICD-10-CM | POA: Diagnosis not present

## 2015-12-25 DIAGNOSIS — R29898 Other symptoms and signs involving the musculoskeletal system: Secondary | ICD-10-CM | POA: Diagnosis not present

## 2015-12-25 DIAGNOSIS — IMO0002 Reserved for concepts with insufficient information to code with codable children: Secondary | ICD-10-CM

## 2015-12-25 DIAGNOSIS — R42 Dizziness and giddiness: Secondary | ICD-10-CM | POA: Diagnosis not present

## 2015-12-25 DIAGNOSIS — R208 Other disturbances of skin sensation: Secondary | ICD-10-CM | POA: Diagnosis not present

## 2015-12-25 DIAGNOSIS — R4189 Other symptoms and signs involving cognitive functions and awareness: Secondary | ICD-10-CM | POA: Diagnosis not present

## 2015-12-25 DIAGNOSIS — R279 Unspecified lack of coordination: Secondary | ICD-10-CM | POA: Diagnosis not present

## 2015-12-25 DIAGNOSIS — I6980 Unspecified sequelae of other cerebrovascular disease: Secondary | ICD-10-CM | POA: Diagnosis not present

## 2015-12-25 NOTE — Therapy (Signed)
[facility] [address] [place], [place], [address] Phone: [phone]   Fax:  [phone]  Physical Therapy Treatment  Patient Details  Name: Veronica Andrade MRN: [id number] Date of Birth: 16-Jan-1951 Referring Provider: Vernona Rieger, NP  Encounter Date: 12/25/2015      PT End of Session - 12/25/15 0931    Visit Number 7   Number of Visits 9   Date for PT Re-Evaluation 01/04/16   Authorization Type [employer], no visit limit, 20.00 copay   PT Start Time 0930   PT Stop Time 1016   PT Time Calculation (min) 46 min   Activity Tolerance Patient tolerated treatment well   Behavior During Therapy [facility] for tasks assessed/performed      Past Medical History  Diagnosis Date  . Hypertension   . Hyperlipidemia     Past Surgical History  Procedure Laterality Date  . Knee arthroscopy Left   . Tubal ligation    . Bladder surgery      Bladder Tack    There were no vitals filed for this visit.  Visit Diagnosis:  Unsteadiness  Abnormality of gait  Lack of coordination due to stroke      Subjective Assessment - 12/25/15 0929    Subjective "I had some impingement in my right shoulders several years ago, but I put some stuff on it and it feels better."    Patient Stated Goals "I want to have full use of my arm, my hand and my R side."    Currently in Pain? No/denies            NMR:  Focused on increased WB and activation in RLE with LLE posterior and lateral squats x 10 reps in each direction.  LLE placed on pillow case in order to decrease friction when going backwards.  Progressed to BLE leg press with 75 lbs x 10 reps transitioning to RLE only with 55lbs with cues for slow controlled movement as initially she tended to let RLE (knee) lock into hyperextension.  Continued to work on RLE stabilization and strengthening as well as improved trunk control with quadruped activity alternating legs only x 10 reps  progressing to alternating arms/legs, however due to deformity in L knee, had to discontinue activity.  Transitioned to high level balance in // bars with RLE in stance on foam beam tapping to 3 cones to further challenge stability and balance in RLE.  Then performed standing on foam beam in tandem (alternating LEs) x 30 secs x 2 reps with EO, progressing to standing on beam facing forward with EC x 2 sets of 30 secs, progressing to EO with head turns side/side x 15 reps and up/down x 15 reps.  Pt did well with head turns, continue to note increased difficulty when visual feedback eliminated.  Ended session with corner balance tasks to further assess HEP at home.  Performed (compliant surface) feet together eyes closed x 30 secs, EC with head turns side/side x 15 reps and up/down x 15 reps.  Progressing to R SLS while on pillows with eyes open x 15 secs progressing to eyes closed.  Pt unable to maintain without UE support with eyes closed, therefore provided education to decrease amount of UE support when performing.  Pt verbalized understanding.                         PT Education - 12/25/15 0930    Education provided Yes  Education Details stretching for R quad muscle   Person(s) Educated Patient   Methods Explanation   Comprehension Verbalized understanding             PT Long Term Goals - 12/05/15 1003    PT LONG TERM GOAL #1   Title Pt will be independent with HEP for balance, strengthening, and vestibular exercises to indicate improved balance and functional mobility. (Target Date: 01/02/16)   PT LONG TERM GOAL #2   Title Pt will increase FGA to 25/30 in order to indicate improved functional balance and decreased fall risk.     PT LONG TERM GOAL #3   Title Pt will report no more than 2 point increase in dizziness with all functional mobility to indicate improved mobility.     PT LONG TERM GOAL #4   Title Pt will ambulate on all outdoor surfaces at mod I level in order  to indicate safe return to community mobility.     PT LONG TERM GOAL #5   Title Pt will perform 5 steps without rail while carrying object at mod I level in order to indicate safe home/community negotiation.                 Plan - 12/25/15 0931    Clinical Impression Statement Skilled session continues to focus on RLE strengthening/NMR as well as high level balance.  Continues to tolerate exercise well but needing cues for R LE stretch during session.    Pt will benefit from skilled therapeutic intervention in order to improve on the following deficits Abnormal gait;Decreased activity tolerance;Decreased balance;Decreased endurance;Decreased mobility;Decreased strength;Dizziness;Impaired perceived functional ability;Impaired flexibility;Impaired sensation;Impaired UE functional use;Improper body mechanics;Postural dysfunction   Rehab Potential Excellent   PT Frequency 2x / week   PT Duration 4 weeks   PT Treatment/Interventions ADLs/Self Care Home Management;Electrical Stimulation;Gait training;Stair training;Functional mobility training;Therapeutic activities;Therapeutic exercise;Balance training;Neuromuscular re-education;Patient/family education;Orthotic Fit/Training;Vestibular   PT Next Visit Plan Continue RLE strengthening, high level balance and vestibular challenges.    Consulted and Agree with Plan of Care Patient        Problem List Patient Active Problem List   Diagnosis Date Noted  . 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 HLD (hyperlipidemia) 04/30/2009  . OTHER MALAISE AND FATIGUE 04/25/2009  . Essential hypertension 03/15/2009  . Allergic rhinitis 03/15/2009  . URINARY INCONTINENCE 03/15/2009    Harriet Butte, PT, MPT [facility] [address] [place], [place], [address] Phone: [phone]   Fax:  [phone] 12/25/2015, 12:49 PM  Name: Veronica Andrade MRN:  [id number] Date of Birth: 14-Aug-1951

## 2015-12-27 ENCOUNTER — Other Ambulatory Visit: Payer: Self-pay | Admitting: Primary Care

## 2015-12-27 ENCOUNTER — Ambulatory Visit: Payer: 59 | Admitting: Occupational Therapy

## 2015-12-27 ENCOUNTER — Encounter: Payer: Self-pay | Admitting: Nurse Practitioner

## 2015-12-27 ENCOUNTER — Encounter: Payer: Self-pay | Admitting: Rehabilitation

## 2015-12-27 ENCOUNTER — Ambulatory Visit (INDEPENDENT_AMBULATORY_CARE_PROVIDER_SITE_OTHER): Payer: 59 | Admitting: Nurse Practitioner

## 2015-12-27 ENCOUNTER — Ambulatory Visit: Payer: 59 | Attending: Primary Care | Admitting: Rehabilitation

## 2015-12-27 ENCOUNTER — Telehealth: Payer: Self-pay | Admitting: Primary Care

## 2015-12-27 VITALS — BP 175/98 | HR 64 | Ht 66.0 in | Wt 185.0 lb

## 2015-12-27 DIAGNOSIS — R269 Unspecified abnormalities of gait and mobility: Secondary | ICD-10-CM | POA: Insufficient documentation

## 2015-12-27 DIAGNOSIS — I69351 Hemiplegia and hemiparesis following cerebral infarction affecting right dominant side: Secondary | ICD-10-CM | POA: Insufficient documentation

## 2015-12-27 DIAGNOSIS — E785 Hyperlipidemia, unspecified: Secondary | ICD-10-CM

## 2015-12-27 DIAGNOSIS — I10 Essential (primary) hypertension: Secondary | ICD-10-CM

## 2015-12-27 DIAGNOSIS — R531 Weakness: Secondary | ICD-10-CM

## 2015-12-27 DIAGNOSIS — R2681 Unsteadiness on feet: Secondary | ICD-10-CM | POA: Diagnosis not present

## 2015-12-27 DIAGNOSIS — I6980 Unspecified sequelae of other cerebrovascular disease: Secondary | ICD-10-CM | POA: Diagnosis not present

## 2015-12-27 DIAGNOSIS — I69898 Other sequelae of other cerebrovascular disease: Secondary | ICD-10-CM | POA: Diagnosis not present

## 2015-12-27 DIAGNOSIS — R42 Dizziness and giddiness: Secondary | ICD-10-CM | POA: Diagnosis not present

## 2015-12-27 DIAGNOSIS — I63312 Cerebral infarction due to thrombosis of left middle cerebral artery: Secondary | ICD-10-CM

## 2015-12-27 DIAGNOSIS — IMO0002 Reserved for concepts with insufficient information to code with codable children: Secondary | ICD-10-CM | POA: Insufficient documentation

## 2015-12-27 DIAGNOSIS — R279 Unspecified lack of coordination: Secondary | ICD-10-CM | POA: Diagnosis not present

## 2015-12-27 DIAGNOSIS — M6289 Other specified disorders of muscle: Secondary | ICD-10-CM | POA: Diagnosis not present

## 2015-12-27 DIAGNOSIS — R4189 Other symptoms and signs involving cognitive functions and awareness: Secondary | ICD-10-CM | POA: Insufficient documentation

## 2015-12-27 MED ORDER — LISINOPRIL 5 MG PO TABS: 10.0000 mg | ORAL_TABLET | Freq: Every day | ORAL | Status: DC

## 2015-12-27 MED ORDER — ATORVASTATIN CALCIUM 40 MG PO TABS: 40.0000 mg | ORAL_TABLET | Freq: Every day | ORAL | Status: DC

## 2015-12-27 MED FILL — LISINOPRIL 5 MG TABLET: 5 | 90 days supply | Qty: 180 | Fill #0

## 2015-12-27 NOTE — Therapy (Signed)
[facility] [address] [place], [place], [address] Phone: [phone]   Fax:  [phone]  Occupational Therapy Treatment  Patient Details  Name: Veronica Andrade MRN: [id number] Date of Birth: 1951/03/12 Referring Provider: Vernona Rieger  Encounter Date: 12/27/2015      OT End of Session - 12/27/15 1027    Visit Number 5   Number of Visits 17   Date for OT Re-Evaluation 02/07/16   Authorization Type [employer] - [place] employee   OT Start Time 0930  10 minutes of ice (unbillable)   OT Stop Time 1018   OT Time Calculation (min) 48 min   Activity Tolerance Patient tolerated treatment well      Past Medical History  Diagnosis Date  . Hypertension   . Hyperlipidemia     Past Surgical History  Procedure Laterality Date  . Knee arthroscopy Left   . Tubal ligation    . Bladder surgery      Bladder Tack    There were no vitals filed for this visit.  Visit Diagnosis:  Hemiplegia and hemiparesis following cerebral infarction affecting right dominant side [facility])      Subjective Assessment - 12/27/15 0929    Subjective  I had an old rotator cuff injury with impingement Rt shoulder, but it wasn't torn,  and got cortisone injections (approx. 7-8 years). It pulls here (along biceps)    Patient Stated Goals I want to go back to work    Currently in Pain? Yes   Pain Score 5    Pain Location Shoulder   Pain Orientation Right   Pain Descriptors / Indicators Sore   Pain Type Acute pain   Pain Frequency Intermittent   Aggravating Factors  certain movements   Pain Relieving Factors rest            [facility] OT Assessment - 12/27/15 0001    Observation/Other Assessments   Observations Pt point tender along biceps and c/o of some pain with shoulder extension back to neutral (from sh. flexion). Pt also reports she has been doing bicep curls with 5 lb. weight. Therapist instructed pt to d/c biceps curls at this time  and work on posture                  OT Treatments/Exercises (OP) - 12/27/15 0001    Exercises   Exercises Shoulder   Shoulder Exercises: ROM/Strengthening   Other ROM/Strengthening Exercises Theraband HEP issued with yellow resistance including: bilateral shoulder extension, bilateral rows with scapula retraction, RUE ER, RUE shoulder flex to chin level x 10 reps each (see pt instructions)    Modalities   Modalities Cryotherapy   Cryotherapy   Number Minutes Cryotherapy 8 Minutes   Cryotherapy Location Upper arm  along biceps   Type of Cryotherapy Ice pack   Manual Therapy   Manual Therapy Taping   Manual therapy comments along biceps to relax muscle   Kinesiotex Inhibit Muscle                OT Education - 12/27/15 1012    Education provided Yes   Education Details Theraband HEP    Person(s) Educated Patient   Methods Explanation;Demonstration;Handout   Comprehension Verbalized understanding;Returned demonstration          OT Short Term Goals - 12/11/15 1412    OT SHORT TERM GOAL #1   Title Pt independent with HEP (due 01/10/16)   Time 4   Period Weeks  Status New   OT SHORT TERM GOAL #2   Title Pt to eat 90% with Rt dominant hand   Baseline 65%   Time 4   Period Weeks   Status New   OT SHORT TERM GOAL #3   Title Pt to apply makeup 50% of time with Rt dominant hand   Baseline all with Lt hand   Time 4   Period Weeks   Status New   OT SHORT TERM GOAL #4   Title Improve coordination as evidenced by performing 9 hole peg test in 28 sec. or less Rt hand   Baseline 38.43 sec. (Lt = 20.12 sec)   Time 4   Period Weeks   Status New   OT SHORT TERM GOAL #5   Title Improve grip strength to 58 lbs or greater   Baseline 52 lbs (Lt = 62 lbs)    Time 4   Period Weeks   Status New   Additional Short Term Goals   Additional Short Term Goals Yes   OT SHORT TERM GOAL #6   Title Improve overall typing to 28 wpm with 80% accuracy or greater    Baseline 14-23 wpm, 24-66% accuracy   Time 4   Period Weeks   Status New   OT SHORT TERM GOAL #7   Title Pt to verbalize understanding with sensory impairments Rt hand and safety precautions needed   Time 4   Period Weeks   Status New           OT Long Term Goals - 12/11/15 1416    OT LONG TERM GOAL #1   Title Independent with updated HEP (DUE 02/07/16)    Time 8   Period Weeks   Status New   OT LONG TERM GOAL #2   Title Pt to perform all ADLS with Rt hand as dominant hand 90% of the time   Time 8   Period Weeks   Status New   OT LONG TERM GOAL #3   Title Pt to return to cooking at Mod I level    Time 8   Period Weeks   Status New   OT LONG TERM GOAL #4   Title Pt to write 1/2 page text in reasonable amt of time maintaining 90% legibility   Time 8   Period Weeks   Status New   OT LONG TERM GOAL #5   Title Improve overall typing to 35 wpm or > with 90% accuracy    Time 8   Period Weeks   Status New               Plan - 12/27/15 1028    Clinical Impression Statement Pt with tenderness along biceps tendon and also with some pain from shoulder flexion back to extension. Pt reports doing biceps curls with 5 lb. weight and instructed to d/c. Pt issued light resistance theraband HEP today to strengthen posterior shoulder girdle, ER, and low range shoulder flexion.    Plan Assess taping, theraband HEP and shoulder pain. Continue coordination RT hand   OT Home Exercise Plan 12/13/15: coordination and putty HEP, Rt handed typing words. 12/27/15: Theraband HEP    Consulted and Agree with Plan of Care Patient        Problem List Patient Active Problem List   Diagnosis Date Noted  . 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 HLD (hyperlipidemia) 04/30/2009  . OTHER MALAISE AND FATIGUE 04/25/2009  .  Essential hypertension 03/15/2009  . Allergic rhinitis 03/15/2009  . URINARY INCONTINENCE 03/15/2009    Kelli Churn, OTR/L 12/27/2015, 10:31 AM  [facility] [address] [place], [place], [address] Phone: [phone]   Fax:  [phone]  Name: Veronica Andrade MRN: [id number] Date of Birth: December 14, 1950

## 2015-12-27 NOTE — Telephone Encounter (Signed)
Refills sent to pharmacy. 

## 2015-12-27 NOTE — Patient Instructions (Signed)
1. EXTENSION: Sitting - Resistance Band (Active)    Sit with both arms at side standing at door. Against yellow resistance band, draw arms backward, as far as possible, keeping elbows straight and shoulders back (NOT UP). Complete _1__ sets of _10__ repetitions. Perform _1__ sessions per day.  2. Scapular Retraction: Bilateral    Facing anchor, pull arms back, squeezing shoulder blades together. Keep forearms parallel to ground ("SAWING" motion) Repeat _10___ times per set. Do __1__ sets per session. Do __1__ sessions per day.  http://orth.exer.us/177   3. External Rotation (Resistive Band)    Rt Elbow bent at right angle, and held firmly against side. Using door as anchor, pull involved arm outward. Hold __3__ seconds. Repeat __10__ times. Do __1__ sessions per day.    4. Face away from door, with theraband in Rt hand at hip level, bring straight in front of you at chin level (thumb side up) x 10 reps, 1 set per day

## 2015-12-27 NOTE — Progress Notes (Signed)
[facility]  PATIENT: Veronica Andrade DOB: 11-May-1951   REASON FOR VISIT: hospital follow-up for left posterolateral thalamic nonhemorrhagic infarct HISTORY FROM:patient    HISTORY OF PRESENT ILLNESS:Mrs. 6, 65 year old female returns for hospital follow-up after  admission for acute onset right sided numbness and mild right arm weakness. She was admitted on 11/24/2015 and discharged 11/26/2015. CT of the head without acute intracranial abnormalities. MRI of the brain with acute left posterior lateral thalamic nonhemorrhagic infarct and mild chronic microvascular ischemic changes. CTA of the neck with minimal plaque carotid bifurcation bilaterally without significant narrowing. CTA of the head with anterior circulation without medium to large size vessel significant stenosis or occlusion.2D ECHO ejection fraction in the range of 65-70%.LDL 150. No anti-thrombotic prior to admission. Noncompliant with anti-hypertensive medications.Hemoglobin A1c 6.6 On today's visit she continues to complain of right-sided numbness and right arm weakness. Her physical therapy has concluded and she continues  occupational therapy. She is walking 2 miles several times a week and she is back at the gym. She is currently on aspirin 325mg  for secondary stroke prevention.Blood pressure prior to this office visit today was 124/72. It is elevated after her physical therapy and occupational therapy which was 2 hours. She is back on lisinopril 10 mg daily. She is on Lipitor for elevated LDL. Cholesterol was 211 on admission.She claims she has gained over 30 pounds in the last year.She returns for reevaluation   REVIEW OF SYSTEMS: Full 14 system review of systems performed and notable only for those listed, all others are neg:  Constitutional: fatigue  Cardiovascular: neg Ear/Nose/Throat: neg  Skin: neg Eyes: neg Respiratory: neg Gastroitestinal: neg  Hematology/Lymphatic: neg  Endocrine:  feeling hot Musculoskeletal:neg Allergy/Immunology: neg Neurological:numbness on the right and weakness Psychiatric: neg Sleep : neg   ALLERGIES: Allergies  Allergen Reactions  . Codeine Nausea Only and Other (See Comments)    Headaches also  . Morphine Nausea Only and Other (See Comments)    Headaches also    HOME MEDICATIONS: Outpatient Prescriptions Prior to Visit  Medication Sig Dispense Refill  . aspirin 325 MG tablet Take 1 tablet (325 mg total) by mouth daily. 60 tablet 0  . atorvastatin (LIPITOR) 40 MG tablet Take 1 tablet (40 mg total) by mouth daily at 6 PM. 60 tablet 0  . cholecalciferol (VITAMIN D) 1000 units tablet Take 1,000 Units by mouth daily.    Marland Kitchen lisinopril (PRINIVIL,ZESTRIL) 5 MG tablet Take 2 tablets (10 mg total) by mouth daily. 60 tablet 0  . Multiple Vitamins-Minerals (MULTIVITAMIN GUMMIES ADULTS) CHEW Chew 2 each by mouth daily.    . ondansetron (ZOFRAN ODT) 4 MG disintegrating tablet Take 1 tablet (4 mg total) by mouth every 8 (eight) hours as needed for nausea. 30 tablet 0  . Cyanocobalamin (B-12 PO) Take 1 tablet by mouth daily. Reported on 12/27/2015     No facility-administered medications prior to visit.    PAST MEDICAL HISTORY: Past Medical History  Diagnosis Date  . Hypertension   . Hyperlipidemia   . Stroke [facility])     PAST SURGICAL HISTORY: Past Surgical History  Procedure Laterality Date  . Knee arthroscopy Left   . Tubal ligation    . Bladder surgery      Bladder Tack    FAMILY HISTORY: Family History  Problem Relation Age of Onset  . Stroke Father   . Diabetes Father   . Hypertension Father   . Arrhythmia Mother   . Hypertension Mother   .  Hypertension Brother     SOCIAL HISTORY: Social History   Social History  . Marital Status: Married    Spouse Name: N/A  . Number of Children: N/A  . Years of Education: N/A   Occupational History  . Not on file.   Social History Main Topics  . Smoking status: Never Smoker     . Smokeless tobacco: Not on file  . Alcohol Use: No  . Drug Use: No  . Sexual Activity: Not on file   Other Topics Concern  . Not on file   Social History Narrative   Married.   2 children, 4 g.children   Works at [employer], Chief of Staff.     Enjoys spending time in [place], camping, spending time with family.     PHYSICAL EXAM  Filed Vitals:   12/27/15 1046  BP: 175/98  Pulse: 64  Height:  (1.676 m)  Weight: 185 lb (83.915 kg)   Body mass index is 29.87 kg/(m^2).  Generalized: Well developed, obese female in no acute distress  Head: normocephalic and atraumatic,. Oropharynx benign  Neck: Supple, no carotid bruits  Cardiac: Regular rate rhythm, no murmur  Musculoskeletal: No deformity   Neurological examination   Mentation: Alert oriented to time, place, history taking. Attention span and concentration appropriate. Recent and remote memory intact.  Follows all commands speech and language fluent.   Cranial nerve II-XII: Fundoscopic exam reveals sharp disc margins.Pupils were equal round reactive to light extraocular movements were full, visual field were full on confrontational test. Facial sensation decreased on the right, and strength were normal. hearing was intact to finger rubbing bilaterally. Uvula tongue midline. head turning and shoulder shrug were normal and symmetric.Tongue protrusion into cheek strength was normal. Motor: normal bulk and tone, full strength in the BUE, BLE, except right upper extremity with subtle dexterity difficulty right hand and forearm. Fine finger movements slowed on the right ,normal on the left no pronator drift. Sensory: normal and symmetric to light touch, pinprick, and  Vibration,on the left, diminished on the right to these modalities Coordination: finger-nose-finger, heel-to-shin bilaterally, no dysmetria Reflexes: 1+ upper lower and symmetric plantar responses were flexor bilaterally. Gait and Station: Rising up from seated  position without assistance, normal stance,  moderate stride, good arm swing, smooth turning, able to perform tiptoe, and heel walking without difficulty. Tandem gait is steady.   DIAGNOSTIC DATA (LABS, IMAGING, TESTING) - I reviewed patient records, labs, notes, testing and imaging myself where available.  Lab Results  Component Value Date   WBC 7.5 11/24/2015   HGB 15.6* 11/24/2015   HCT 46.0 11/24/2015   MCV 89.2 11/24/2015   PLT 186 11/24/2015      Component Value Date/Time   NA 143 11/24/2015 1935   K 3.4* 11/24/2015 1935   CL 103 11/24/2015 1935   CO2 23 11/24/2015 1934   GLUCOSE 131* 11/24/2015 1935   BUN 7 11/24/2015 1935   CREATININE 0.70 11/24/2015 1935   CALCIUM 9.2 11/24/2015 1934   PROT 7.1 11/24/2015 1934   ALBUMIN 4.2 11/24/2015 1934   AST 35 11/24/2015 1934   ALT 33 11/24/2015 1934   ALKPHOS 91 11/24/2015 1934   BILITOT 0.6 11/24/2015 1934   GFRNONAA >60 11/24/2015 1934   GFRAA >60 11/24/2015 1934   Lab Results  Component Value Date   CHOL 211* 11/25/2015   HDL 37* 11/25/2015   LDLCALC 150* 11/25/2015   LDLDIRECT 165.2 04/25/2009   TRIG 119 11/25/2015   CHOLHDL 5.7  11/25/2015   Lab Results  Component Value Date   HGBA1C 6.6* 11/25/2015    ASSESSMENT AND PLAN  65 y.o. year old female  has a past medical history of Hypertension; Hyperlipidemia; and Stroke (HCC). here to follow up. Hospital records reviewed. See above.The patient is a current patient of Dr. Roda Shutters   who is out of the office today . This note is sent to the work in doctor.     Control of risk factors to prevent another stroke Keep systolic blood pressure less than 130, keep a record for  primary care, continue lisinopril Continue aspirin 325. and Lipitor for secondary stroke prevention LDL 150 on admission goal less than 70 PCP will follow Continue to exercise by walking, healthy diet low-fat home exercise program by physical therapy, slow weight loss Continue occupational  therapy Follow-up with me in 3 months. Vst time 40 min Nilda Riggs, [facility], [facility], APRN  [facility] [address], [address] [place], [place] [address] [phone]

## 2015-12-27 NOTE — Telephone Encounter (Signed)
Message left for patient to return my call.  

## 2015-12-27 NOTE — Telephone Encounter (Signed)
Called and made sure she got the message. Patient stated that she already pick up her meds.

## 2015-12-27 NOTE — Progress Notes (Signed)
I agree with the assessment and plan as directed by NP .The patient is known to me .   Keina Mutch, MD  

## 2015-12-27 NOTE — Patient Instructions (Signed)
Control of risk factors to prevent another stroke Keep systolic blood pressure less than 130, keep a record for  primary care, continue lisinopril Continue aspirin 325. And Lipitor for secondary stroke prevention LDL 150 on admission goal less than 70 PCP will follow Continue to exercise by walking, healthy diet low-fat Continue occupational therapy Follow-up with me in 3 months

## 2015-12-27 NOTE — Telephone Encounter (Signed)
Pt called, stated that [facility] had not heard back re: her lisinopril (PRINIVIL,ZESTRIL) 5 MG tablet [[id number] prescription (pt has 4 tablets left).  Explained we had not received a request from pharmacy.  Last refilled on 11/26/2015.  Pt states she also needs refill on atorvastatin (LIPITOR) 40 MG tablet .   Best number to call pt is [phone] if needed.

## 2015-12-27 NOTE — Therapy (Signed)
[facility] [address] [place], [place], [address] Phone: [phone]   Fax:  [phone]  Physical Therapy Treatment and D/C Summary  Patient Details  Name: Veronica Andrade MRN: [id number] Date of Birth: 1951/02/11 Referring Provider: Alma Friendly, NP  Encounter Date: 12/27/2015      PT End of Session - 12/27/15 0850    Visit Number 8   Number of Visits 9   Date for PT Re-Evaluation 01/04/16   Authorization Type UMR, no visit limit, 20.00 copay   PT Start Time 0845   PT Stop Time 0925   PT Time Calculation (min) 40 min   Activity Tolerance Patient tolerated treatment well   Behavior During Therapy [facility] for tasks assessed/performed      Past Medical History  Diagnosis Date  . Hypertension   . Hyperlipidemia     Past Surgical History  Procedure Laterality Date  . Knee arthroscopy Left   . Tubal ligation    . Bladder surgery      Bladder Tack    There were no vitals filed for this visit.  Visit Diagnosis:  Unsteadiness  Abnormality of gait  Dizziness and giddiness  Hemiplegia and hemiparesis following cerebral infarction affecting right dominant side [facility])      Subjective Assessment - 12/27/15 0849    Subjective "I have my appt today at 11am but I have to get there early to fill out paper work."    [employer] hold activities;Walking   Patient Stated Goals "I want to have full use of my arm, my hand and my R side."    Currently in Pain? No/denies            [facility] PT Assessment - 12/27/15 0859    Functional Gait  Assessment   Gait assessed  Yes   Gait Level Surface Walks 20 ft in less than 5.5 sec, no assistive devices, good speed, no evidence for imbalance, normal gait pattern, deviates no more than 6 in outside of the 12 in walkway width.   Change in Gait Speed Able to smoothly change walking speed without loss of balance or gait deviation. Deviate no more than 6 in outside of the  12 in walkway width.   Gait with Horizontal Head Turns Performs head turns smoothly with no change in gait. Deviates no more than 6 in outside 12 in walkway width   Gait with Vertical Head Turns Performs head turns with no change in gait. Deviates no more than 6 in outside 12 in walkway width.   Gait and Pivot Turn Pivot turns safely within 3 sec and stops quickly with no loss of balance.   Step Over Obstacle Is able to step over 2 stacked shoe boxes taped together (9 in total height) without changing gait speed. No evidence of imbalance.   Gait with Narrow Base of Support Is able to ambulate for 10 steps heel to toe with no staggering.   Gait with Eyes Closed Walks 20 ft, uses assistive device, slower speed, mild gait deviations, deviates 6-10 in outside 12 in walkway width. Ambulates 20 ft in less than 9 sec but greater than 7 sec.   Ambulating Backwards Walks 20 ft, uses assistive device, slower speed, mild gait deviations, deviates 6-10 in outside 12 in walkway width.   Steps Alternating feet, no rail.   Total Score 28             NMR:  Performed re-assessment of FGA to address  LTG.  Note that new score is 28/30 which is no longer indicative of fall risk, see full details above.  Also performed high level balance vestibular task during session standing on foam mat while moving ball in diagonal pattern up to the R and down to the L and around leg back to the upper R>then opposite diagonal, both x 10 reps each progressing to moving in "X" pattern to increase challenge.  Pt with mild increase in dizziness to 1/10 with R to L movements, however note more difficulty maintaining grasp of ball with RUE.    Gait:  Assessed gait on outdoor surfaces during session to address LTG.  Note she is able to ambulate independently over all outdoor surfaces with no signs of instability nor decrease in gait speed.  Also performed stairs while carrying object to better simulate work or home duties.  She was  able to perform this at mod I level as well.    Self Care:  Continued education on community fitness and compliance with HEP.  Pt verbalized understanding and is going to gym with husband and performing HEP daily.                  PT Education - 12/27/15 0850    Education provided Yes   Education Details D/C from therapy, continued community fitness    Person(s) Educated Patient   Methods Explanation   Comprehension Verbalized understanding             PT Long Term Goals - 12/27/15 0851    PT LONG TERM GOAL #1   Title Pt will be independent with HEP for balance, strengthening, and vestibular exercises to indicate improved balance and functional mobility. (Target Date: 01/02/16)   Baseline met 12/27/15   Status Achieved   PT LONG TERM GOAL #2   Title Pt will increase FGA to 25/30 in order to indicate improved functional balance and decreased fall risk.     Baseline 28/30 on 12/27/15   Status Achieved   PT LONG TERM GOAL #3   Title Pt will report no more than 2 point increase in dizziness with all functional mobility to indicate improved mobility.     Status Achieved   PT LONG TERM GOAL #4   Title Pt will ambulate on all outdoor surfaces at mod I level in order to indicate safe return to community mobility.     Baseline Performed >1000' over all surfaces at independent level on 12/27/15   Status Achieved   PT LONG TERM GOAL #5   Title Pt will perform 5 steps without rail while carrying object at mod I level in order to indicate safe home/community negotiation.     Baseline met 12/27/15   Status Achieved               Plan - 12/27/15 0850    Clinical Impression Statement Skilled session focused on assessment of LTG's.  Pt has met all 5/5 LTG's and is ready for D/C.  Note that she continues to have some very high level vestibular deficits, but has been given HEP to address these deficits.  Pt verbalized understanding.    Pt will benefit from skilled therapeutic  intervention in order to improve on the following deficits Abnormal gait;Decreased activity tolerance;Decreased balance;Decreased endurance;Decreased mobility;Decreased strength;Dizziness;Impaired perceived functional ability;Impaired flexibility;Impaired sensation;Impaired UE functional use;Improper body mechanics;Postural dysfunction   Rehab Potential Excellent   PT Frequency 2x / week   PT Duration 4 weeks   PT Treatment/Interventions  ADLs/Self Care Home Management;Electrical Stimulation;Gait training;Stair training;Functional mobility training;Therapeutic activities;Therapeutic exercise;Balance training;Neuromuscular re-education;Patient/family education;Orthotic Fit/Training;Vestibular   PT Next Visit Plan n/a   Consulted and Agree with Plan of Care Patient      PHYSICAL THERAPY DISCHARGE SUMMARY  Visits from Start of Care: 8  Current functional level related to goals / functional outcomes: See LTG's    Remaining deficits: Pt continues to have very high level vestibular deficits, however she has been given HEP to address these deficits.     Education / Equipment: HEP and education to return to community fitness.   Plan: Patient agrees to discharge.  Patient goals were met. Patient is being discharged due to meeting the stated rehab goals.  ?????          Problem List Patient Active Problem List   Diagnosis Date Noted  . Stroke (cerebrum) ([place]) 11/24/2015  . 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 HLD (hyperlipidemia) 04/30/2009  . OTHER MALAISE AND FATIGUE 04/25/2009  . Essential hypertension 03/15/2009  . Allergic rhinitis 03/15/2009  . URINARY INCONTINENCE 03/15/2009    Cameron Sprang, PT, MPT [facility] [address] [place], [place], [address] Phone: [phone]   Fax:  [phone] 12/27/2015, 9:28 AM  Name: Veronica Andrade MRN: [id number] Date of Birth: [birth year]

## 2015-12-28 ENCOUNTER — Telehealth: Payer: Self-pay | Admitting: *Deleted

## 2015-12-28 ENCOUNTER — Ambulatory Visit: Payer: 59 | Admitting: Occupational Therapy

## 2015-12-28 NOTE — Telephone Encounter (Signed)
Form completed, signed.  Back to MR.

## 2015-12-28 NOTE — Telephone Encounter (Signed)
Form,[employer] received,completed Eber Jones and [place] faxed 12/28/15.

## 2016-01-01 ENCOUNTER — Ambulatory Visit: Payer: 59 | Admitting: Occupational Therapy

## 2016-01-01 ENCOUNTER — Encounter: Payer: Self-pay | Admitting: Occupational Therapy

## 2016-01-01 ENCOUNTER — Ambulatory Visit: Payer: 59 | Admitting: Rehabilitation

## 2016-01-01 DIAGNOSIS — R2681 Unsteadiness on feet: Secondary | ICD-10-CM | POA: Diagnosis not present

## 2016-01-01 DIAGNOSIS — R269 Unspecified abnormalities of gait and mobility: Secondary | ICD-10-CM | POA: Diagnosis not present

## 2016-01-01 DIAGNOSIS — I69351 Hemiplegia and hemiparesis following cerebral infarction affecting right dominant side: Secondary | ICD-10-CM

## 2016-01-01 DIAGNOSIS — R4189 Other symptoms and signs involving cognitive functions and awareness: Secondary | ICD-10-CM | POA: Diagnosis not present

## 2016-01-01 DIAGNOSIS — IMO0002 Reserved for concepts with insufficient information to code with codable children: Secondary | ICD-10-CM

## 2016-01-01 DIAGNOSIS — I6980 Unspecified sequelae of other cerebrovascular disease: Secondary | ICD-10-CM | POA: Diagnosis not present

## 2016-01-01 DIAGNOSIS — M6289 Other specified disorders of muscle: Secondary | ICD-10-CM | POA: Diagnosis not present

## 2016-01-01 DIAGNOSIS — R279 Unspecified lack of coordination: Secondary | ICD-10-CM | POA: Diagnosis not present

## 2016-01-01 DIAGNOSIS — R42 Dizziness and giddiness: Secondary | ICD-10-CM | POA: Diagnosis not present

## 2016-01-01 NOTE — Therapy (Signed)
[facility] [address] [place], [place], [address] Phone: [phone]   Fax:  [phone]  Occupational Therapy Treatment  Patient Details  Name: Veronica Andrade MRN: [id number] Date of Birth: [birth year] Referring Provider: Alma Friendly  Encounter Date: 01/01/2016      OT End of Session - 01/01/16 1102    Visit Number 6   Number of Visits 17   Date for OT Re-Evaluation 02/07/16   Authorization Type UMR - [employer] employee   OT Start Time 0174   OT Stop Time 1100   OT Time Calculation (min) 45 min   Activity Tolerance Patient tolerated treatment well      Past Medical History  Diagnosis Date  . Hypertension   . Hyperlipidemia   . Stroke [facility])     Past Surgical History  Procedure Laterality Date  . Knee arthroscopy Left   . Tubal ligation    . Bladder surgery      Bladder Tack    There were no vitals filed for this visit.  Visit Diagnosis:  Lack of coordination due to stroke  Hemiplegia and hemiparesis following cerebral infarction affecting right dominant side ([place])      Subjective Assessment - 01/01/16 1020    Subjective  My Rt biceps and shoulder feel much better.    Patient Stated Goals I want to go back to work    Currently in Pain? No/denies                      OT Treatments/Exercises (OP) - 01/01/16 0001    ADLs   Work Typing test #'s only: 25 wpm, 80% accuracy. Words only: 32 wpm, 81% accuracy   Shoulder Exercises: ROM/Strengthening   Other ROM/Strengthening Exercises Pt performed theraband exercises with min cues for clarification to perform correctly. Pt reports these exercises, as well as d/c bicep curls, have helped a lot with shoulder/biceps pain    Fine Motor Coordination   Other Fine Motor Exercises Pt manipulating medium sized pegs in RT hand (up to 4 at a time) with min to mod drops and sh. hiking. Pt required cues to drop Rt shoulder and to prevent  compensation.    Other Fine Motor Exercises Translating stress balls in Rt hand for in hand manipulation with mod difficulty, but little to no drops. Noted 4th/5th digits not coordinated together for this activity and pt reports these 2 digits are the hardest for typing as well.                   OT Short Term Goals - 01/01/16 1051    OT SHORT TERM GOAL #1   Title Pt independent with HEP (due 01/10/16)   Time 4   Period Weeks   Status Achieved   OT SHORT TERM GOAL #2   Title Pt to eat 90% with Rt dominant hand   Baseline 65%   Time 4   Period Weeks   Status Achieved   OT SHORT TERM GOAL #3   Title Pt to apply makeup 50% of time with Rt dominant hand   Baseline all with Lt hand   Time 4   Period Weeks   Status Achieved   OT SHORT TERM GOAL #4   Title Improve coordination as evidenced by performing 9 hole peg test in 28 sec. or less Rt hand   Baseline 38.43 sec. (Lt = 20.12 sec)   Time 4   Period Weeks  Status On-going   OT SHORT TERM GOAL #5   Title Improve grip strength to 58 lbs or greater   Baseline 52 lbs (Lt = 62 lbs)    Time 4   Period Weeks   Status On-going   OT SHORT TERM GOAL #6   Title Improve overall typing to 28 wpm with 80% accuracy or greater   Baseline 14-23 wpm, 24-66% accuracy   Time 4   Period Weeks   Status Partially Met  01/01/16: met with words (32 wpm, 81% accuracy), not met with #'s (25 wpm, 80% accuracy)   OT SHORT TERM GOAL #7   Title Pt to verbalize understanding with sensory impairments Rt hand and safety precautions needed   Time 4   Period Weeks   Status Achieved           OT Long Term Goals - 12/11/15 1416    OT LONG TERM GOAL #1   Title Independent with updated HEP (DUE 02/07/16)    Time 8   Period Weeks   Status New   OT LONG TERM GOAL #2   Title Pt to perform all ADLS with Rt hand as dominant hand 90% of the time   Time 8   Period Weeks   Status New   OT LONG TERM GOAL #3   Title Pt to return to cooking at Mod I  level    Time 8   Period Weeks   Status New   OT LONG TERM GOAL #4   Title Pt to write 1/2 page text in reasonable amt of time maintaining 90% legibility   Time 8   Period Weeks   Status New   OT LONG TERM GOAL #5   Title Improve overall typing to 35 wpm or > with 90% accuracy    Time 8   Period Weeks   Status New               Plan - 01/01/16 1057    Clinical Impression Statement Pt met STG's #1, 2, 3, and 7. Pt approximating remaining STG's. Pt with improved coordination and typing speed and no pain at shoulder today   Plan Assess 9 hole peg test and grip strength next session (STG's #4 and #5). Gripper activity   OT Home Exercise Plan 12/13/15: coordination and putty HEP, Rt handed typing words. 12/27/15: Theraband HEP    Consulted and Agree with Plan of Care Patient        Problem List Patient Active Problem List   Diagnosis Date Noted  . Weakness due to cerebrovascular accident 12/27/2015  . Stroke (cerebrum) ([place]) 11/24/2015  . 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 HLD (hyperlipidemia) 04/30/2009  . OTHER MALAISE AND FATIGUE 04/25/2009  . Essential hypertension 03/15/2009  . Allergic rhinitis 03/15/2009  . URINARY INCONTINENCE 03/15/2009    Carey Bullocks, OTR/L 01/01/2016, 11:03 AM  [facility] [address] [place], [place], [address] Phone: [phone]   Fax:  [phone]  Name: Veronica Andrade MRN: [id number] Date of Birth: [birth year]

## 2016-01-04 ENCOUNTER — Encounter: Payer: Self-pay | Admitting: Occupational Therapy

## 2016-01-04 ENCOUNTER — Ambulatory Visit: Payer: 59 | Admitting: Rehabilitation

## 2016-01-04 ENCOUNTER — Ambulatory Visit: Payer: 59 | Admitting: Occupational Therapy

## 2016-01-04 DIAGNOSIS — R4189 Other symptoms and signs involving cognitive functions and awareness: Secondary | ICD-10-CM | POA: Diagnosis not present

## 2016-01-04 DIAGNOSIS — M6289 Other specified disorders of muscle: Secondary | ICD-10-CM | POA: Diagnosis not present

## 2016-01-04 DIAGNOSIS — I6980 Unspecified sequelae of other cerebrovascular disease: Secondary | ICD-10-CM | POA: Diagnosis not present

## 2016-01-04 DIAGNOSIS — R29898 Other symptoms and signs involving the musculoskeletal system: Secondary | ICD-10-CM

## 2016-01-04 DIAGNOSIS — R269 Unspecified abnormalities of gait and mobility: Secondary | ICD-10-CM | POA: Diagnosis not present

## 2016-01-04 DIAGNOSIS — R2681 Unsteadiness on feet: Secondary | ICD-10-CM | POA: Diagnosis not present

## 2016-01-04 DIAGNOSIS — I69351 Hemiplegia and hemiparesis following cerebral infarction affecting right dominant side: Secondary | ICD-10-CM | POA: Diagnosis not present

## 2016-01-04 DIAGNOSIS — IMO0002 Reserved for concepts with insufficient information to code with codable children: Secondary | ICD-10-CM

## 2016-01-04 DIAGNOSIS — R42 Dizziness and giddiness: Secondary | ICD-10-CM | POA: Diagnosis not present

## 2016-01-04 DIAGNOSIS — R279 Unspecified lack of coordination: Secondary | ICD-10-CM | POA: Diagnosis not present

## 2016-01-04 NOTE — Therapy (Signed)
[facility] [address] [place], [place], [address] Phone: [phone]   Fax:  [phone]  Occupational Therapy Treatment  Patient Details  Name: Veronica Andrade MRN: [id number] Date of Birth: November 09, 1951 Referring Provider: Alma Friendly  Encounter Date: 01/04/2016      OT End of Session - 01/04/16 1013    Visit Number 7   Number of Visits 17   Date for OT Re-Evaluation 02/07/16   Authorization Type [employer] [place] employee   OT Start Time 0930   OT Stop Time 1015   OT Time Calculation (min) 45 min   Activity Tolerance Patient tolerated treatment well      Past Medical History  Diagnosis Date  . Hypertension   . Hyperlipidemia   . Stroke [facility])     Past Surgical History  Procedure Laterality Date  . Knee arthroscopy Left   . Tubal ligation    . Bladder surgery      Bladder Tack    There were no vitals filed for this visit.  Visit Diagnosis:  Lack of coordination due to stroke  Hemiplegia and hemiparesis following cerebral infarction affecting right dominant side (HCC)  Weakness of right hand      Subjective Assessment - 01/04/16 0937    Subjective  I took the tape off yesterday and my biceps feels a little heavier, but no pain   Patient Stated Goals I want to go back to work    Currently in Pain? No/denies            [facility] OT Assessment - 01/04/16 0001    Coordination   Right 9 Hole Peg Test 25.59 sec.    Hand Function   Right Hand Grip (lbs) 50 lbs                  OT Treatments/Exercises (OP) - 01/04/16 0001    ADLs   ADL Comments Assessed remaining STG's (see assessment). Therapist was going to tape biceps today but skin was slightly irritated d/t pt just taking off yesterday (Pt had left tape on 7 days. Instructed pt to only leave on 5 days) Pt told she could ice biceps if it begins hurting - pt agreed   Shoulder Exercises: ROM/Strengthening   UBE (Upper Arm Bike) x  5 minutes Level 1 (backwards)    Fine Motor Coordination   In Hand Manipulation Training with coins and cards in various activites   Hand Exercises   Other Hand Exercises Gripper set at 55 lbs resistance for sustained grip strength and sensory feedback Rt hand with min to mod drops and 2 rest breaks required. (Pt fatigued with increasing drops near end of task)    Manual Therapy   Kinesiotex --                  OT Short Term Goals - 01/04/16 0950    OT SHORT TERM GOAL #1   Title Pt independent with HEP (due 01/10/16)   Time 4   Period Weeks   Status Achieved   OT SHORT TERM GOAL #2   Title Pt to eat 90% with Rt dominant hand   Baseline 65%   Time 4   Period Weeks   Status Achieved   OT SHORT TERM GOAL #3   Title Pt to apply makeup 50% of time with Rt dominant hand   Baseline all with Lt hand   Time 4   Period Weeks   Status  Achieved   OT SHORT TERM GOAL #4   Title Improve coordination as evidenced by performing 9 hole peg test in 28 sec. or less Rt hand   Baseline 38.43 sec. (Lt = 20.12 sec)   Time 4   Period Weeks   Status Achieved  01/04/16: Rt = 25.59 sec.    OT SHORT TERM GOAL #5   Title Improve grip strength to 58 lbs or greater   Baseline 52 lbs (Lt = 62 lbs)    Time 4   Period Weeks   Status Not Met  01/04/16: Rt = 50 lbs   OT SHORT TERM GOAL #6   Title Improve overall typing to 28 wpm with 80% accuracy or greater   Baseline 14-23 wpm, 24-66% accuracy   Time 4   Period Weeks   Status Partially Met  01/01/16: met with words (32 wpm, 81% accuracy), not met with #'s (25 wpm, 80% accuracy)   OT SHORT TERM GOAL #7   Title Pt to verbalize understanding with sensory impairments Rt hand and safety precautions needed   Time 4   Period Weeks   Status Achieved           OT Long Term Goals - 12/11/15 1416    OT LONG TERM GOAL #1   Title Independent with updated HEP (DUE 02/07/16)    Time 8   Period Weeks   Status New   OT LONG TERM GOAL #2   Title Pt to  perform all ADLS with Rt hand as dominant hand 90% of the time   Time 8   Period Weeks   Status New   OT LONG TERM GOAL #3   Title Pt to return to cooking at Mod I level    Time 8   Period Weeks   Status New   OT LONG TERM GOAL #4   Title Pt to write 1/2 page text in reasonable amt of time maintaining 90% legibility   Time 8   Period Weeks   Status New   OT LONG TERM GOAL #5   Title Improve overall typing to 35 wpm or > with 90% accuracy    Time 8   Period Weeks   Status New               Plan - 01/04/16 1013    Clinical Impression Statement Pt met STG #4 today. Pt with improved coordination Rt hand and now met all STG's early (except grip strength goal)    Plan continue coordination, grip strength, taping to biceps    OT Home Exercise Plan 12/13/15: coordination and putty HEP, Rt handed typing words. 12/27/15: Theraband HEP    Consulted and Agree with Plan of Care Patient        Problem List Patient Active Problem List   Diagnosis Date Noted  . Weakness due to cerebrovascular accident 12/27/2015  . Stroke (cerebrum) ([place]) 11/24/2015  . 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 HLD (hyperlipidemia) 04/30/2009  . OTHER MALAISE AND FATIGUE 04/25/2009  . Essential hypertension 03/15/2009  . Allergic rhinitis 03/15/2009  . URINARY INCONTINENCE 03/15/2009    Carey Bullocks, OTR/L 01/04/2016, 10:16 AM  [facility] [address] [place], [place], [address] Phone: [phone]   Fax:  [phone]  Name: TIERRIA WATSON MRN: [id number] Date of Birth: 02/17/1951

## 2016-01-09 ENCOUNTER — Ambulatory Visit: Payer: 59 | Admitting: *Deleted

## 2016-01-09 DIAGNOSIS — R449 Unspecified symptoms and signs involving general sensations and perceptions: Secondary | ICD-10-CM

## 2016-01-09 DIAGNOSIS — R4189 Other symptoms and signs involving cognitive functions and awareness: Secondary | ICD-10-CM | POA: Diagnosis not present

## 2016-01-09 DIAGNOSIS — I6980 Unspecified sequelae of other cerebrovascular disease: Secondary | ICD-10-CM | POA: Diagnosis not present

## 2016-01-09 DIAGNOSIS — IMO0002 Reserved for concepts with insufficient information to code with codable children: Secondary | ICD-10-CM

## 2016-01-09 DIAGNOSIS — M6289 Other specified disorders of muscle: Secondary | ICD-10-CM | POA: Diagnosis not present

## 2016-01-09 DIAGNOSIS — R29898 Other symptoms and signs involving the musculoskeletal system: Secondary | ICD-10-CM

## 2016-01-09 DIAGNOSIS — R2681 Unsteadiness on feet: Secondary | ICD-10-CM | POA: Diagnosis not present

## 2016-01-09 DIAGNOSIS — R42 Dizziness and giddiness: Secondary | ICD-10-CM | POA: Diagnosis not present

## 2016-01-09 DIAGNOSIS — R279 Unspecified lack of coordination: Secondary | ICD-10-CM | POA: Diagnosis not present

## 2016-01-09 DIAGNOSIS — R269 Unspecified abnormalities of gait and mobility: Secondary | ICD-10-CM | POA: Diagnosis not present

## 2016-01-09 DIAGNOSIS — I69351 Hemiplegia and hemiparesis following cerebral infarction affecting right dominant side: Secondary | ICD-10-CM

## 2016-01-09 NOTE — Therapy (Signed)
[facility] [address] [place], [place], [address] Phone: [phone]   Fax:  [phone]  Occupational Therapy Treatment  Patient Details  Name: Veronica Andrade MRN: [id number] Date of Birth: 19-Jun-1951 Referring Provider: Alma Friendly  Encounter Date: 01/09/2016      OT End of Session - 01/09/16 1134    Visit Number 8   Number of Visits 17   Date for OT Re-Evaluation 02/07/16   Authorization Type [employer] [place] employee   OT Start Time 0930   OT Stop Time 1015   OT Time Calculation (min) 45 min   Activity Tolerance Patient tolerated treatment well   Behavior During Therapy [facility] for tasks assessed/performed      Past Medical History  Diagnosis Date  . Hypertension   . Hyperlipidemia   . Stroke [facility])     Past Surgical History  Procedure Laterality Date  . Knee arthroscopy Left   . Tubal ligation    . Bladder surgery      Bladder Tack    There were no vitals filed for this visit.  Visit Diagnosis:  Lack of coordination due to stroke  Hemiplegia and hemiparesis following cerebral infarction affecting right dominant side (HCC)  Weakness of right hand  Right-sided lack of sensation      Subjective Assessment - 01/09/16 0936    Subjective  I'm still numb everywhere, "numb from right side down". I want to wait until Thursday to re-tape my bicep.    Patient Stated Goals I want to go back to work, the neurologist hasn't released me   Currently in Pain? No/denies         Treatment:  Engaged patient in 40 minutes of therapeutic activities. First pt worked on Advice worker, pt with difficult time completing this activity. Pt took more than a reasonable amount of time to get 7 pegs into grooved peg board. During this, pt required max multimodal cueing for correct body mechanics, overall positioning, and encouragement. Pt with increased frustration during this task. Pt then  worked on overall grip strength using 35 lb gripper putting blocks in bowel, pt with minimal dropping and able to quickly complete this activity. Therapist increased gripper strength to 55lbs and patient required increased time with moderate dropping & pt required rest break about half-way through this graded/modified activity. After these activities, pt used small peg board with small pegs. During this, pt tended to abduct shoulder with elbow sticking out to side. Had patient hold papers under her arm to ensure elbow stayed by side. This modified the task to make it harder, but this ensured patient used correct body mechanics and positioning. Encouraged pt to try this technique when she is eating to ensure elbow stays closer to side.                      OT Education - 01/09/16 1131    Education provided Yes   Education Details keeping elbow down and by side during functional activities/tasks, encouraged pt to hold something under that arm to ensure it stays down   Person(s) Educated Patient   Methods Explanation;Demonstration   Comprehension Need further instruction;Verbalized understanding          OT Short Term Goals - 01/09/16 1134    OT SHORT TERM GOAL #1   Title Pt independent with HEP (due 01/10/16)   Time 4   Period Weeks   Status Achieved  OT SHORT TERM GOAL #2   Title Pt to eat 90% with Rt dominant hand   Baseline 65%   Time 4   Period Weeks   Status Achieved   OT SHORT TERM GOAL #3   Title Pt to apply makeup 50% of time with Rt dominant hand   Baseline all with Lt hand   Time 4   Period Weeks   Status Achieved   OT SHORT TERM GOAL #4   Title Improve coordination as evidenced by performing 9 hole peg test in 28 sec. or less Rt hand   Baseline 38.43 sec. (Lt = 20.12 sec)   Time 4   Period Weeks   Status Achieved  01/04/16: Rt = 25.59 sec.    OT SHORT TERM GOAL #5   Title Improve grip strength to 58 lbs or greater   Baseline 52 lbs (Lt = 62 lbs)     Time 4   Period Weeks   Status Not Met  01/04/16: Rt = 50 lbs   OT SHORT TERM GOAL #6   Title Improve overall typing to 28 wpm with 80% accuracy or greater   Baseline 14-23 wpm, 24-66% accuracy   Time 4   Period Weeks   Status Partially Met  01/01/16: met with words (32 wpm, 81% accuracy), not met with #'s (25 wpm, 80% accuracy)   OT SHORT TERM GOAL #7   Title Pt to verbalize understanding with sensory impairments Rt hand and safety precautions needed   Time 4   Period Weeks   Status Achieved           OT Long Term Goals - 01/09/16 1134    OT LONG TERM GOAL #1   Title Independent with updated HEP (DUE 02/07/16)    Time 8   Period Weeks   Status On-going   OT LONG TERM GOAL #2   Title Pt to perform all ADLS with Rt hand as dominant hand 90% of the time   Time 8   Period Weeks   Status On-going   OT LONG TERM GOAL #3   Title Pt to return to cooking at Mod I level    Time 8   Period Weeks   Status On-going   OT LONG TERM GOAL #4   Title Pt to write 1/2 page text in reasonable amt of time maintaining 90% legibility   Time 8   Period Weeks   Status On-going   OT LONG TERM GOAL #5   Title Improve overall typing to 35 wpm or > with 90% accuracy    Time 8   Period Weeks   Status On-going               Plan - 01/09/16 1132    Clinical Impression Statement Pt continues to improve towards OT LTGs. Pt states she has been completing her HEP daily.    Pt will benefit from skilled therapeutic intervention in order to improve on the following deficits (Retired) Decreased coordination;Decreased endurance;Decreased Surveyor, mining;Impaired sensation;Decreased activity tolerance;Decreased knowledge of use of DME;Impaired UE functional use;Decreased mobility;Decreased strength   Rehab Potential Good   OT Frequency 2x / week   OT Duration 8 weeks  plus evaluation   OT Treatment/Interventions Self-care/ADL training;Patient/family education;Therapeutic exercise;Functional  Mobility Training;Neuromuscular education;Manual Therapy;DME and/or AE instruction;Parrafin;Therapeutic activities;Fluidtherapy;Moist Heat;Passive range of motion;Cognitive remediation/compensation;Visual/perceptual remediation/compensation   Plan continue coordination, grip strength, taping to biceps (pt refused taping 01/09/16)   OT Home Exercise Plan 12/13/15: coordination and putty HEP,  Rt handed typing words. 12/27/15: Theraband HEP    Consulted and Agree with Plan of Care Patient        Problem List Patient Active Problem List   Diagnosis Date Noted  . Weakness due to cerebrovascular accident 12/27/2015  . Stroke (cerebrum) ([place]) 11/24/2015  . 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 HLD (hyperlipidemia) 04/30/2009  . OTHER MALAISE AND FATIGUE 04/25/2009  . Essential hypertension 03/15/2009  . Allergic rhinitis 03/15/2009  . URINARY INCONTINENCE 03/15/2009    Chrys Racer , MS, OTR/L, CLT 01/09/2016, 11:36 AM  [facility] [address] [place], [place], [address] Phone: [phone]   Fax:  [phone]  Name: Veronica Andrade MRN: [id number] Date of Birth: July 27, 1951

## 2016-01-11 ENCOUNTER — Encounter: Payer: Self-pay | Admitting: Occupational Therapy

## 2016-01-11 ENCOUNTER — Ambulatory Visit: Payer: 59 | Admitting: Occupational Therapy

## 2016-01-11 DIAGNOSIS — I69351 Hemiplegia and hemiparesis following cerebral infarction affecting right dominant side: Secondary | ICD-10-CM

## 2016-01-11 DIAGNOSIS — R4189 Other symptoms and signs involving cognitive functions and awareness: Secondary | ICD-10-CM | POA: Diagnosis not present

## 2016-01-11 DIAGNOSIS — M6289 Other specified disorders of muscle: Secondary | ICD-10-CM | POA: Diagnosis not present

## 2016-01-11 DIAGNOSIS — R2681 Unsteadiness on feet: Secondary | ICD-10-CM | POA: Diagnosis not present

## 2016-01-11 DIAGNOSIS — I6980 Unspecified sequelae of other cerebrovascular disease: Secondary | ICD-10-CM | POA: Diagnosis not present

## 2016-01-11 DIAGNOSIS — R269 Unspecified abnormalities of gait and mobility: Secondary | ICD-10-CM | POA: Diagnosis not present

## 2016-01-11 DIAGNOSIS — R279 Unspecified lack of coordination: Secondary | ICD-10-CM | POA: Diagnosis not present

## 2016-01-11 DIAGNOSIS — IMO0002 Reserved for concepts with insufficient information to code with codable children: Secondary | ICD-10-CM

## 2016-01-11 DIAGNOSIS — R42 Dizziness and giddiness: Secondary | ICD-10-CM | POA: Diagnosis not present

## 2016-01-11 NOTE — Therapy (Signed)
[facility] [address] [place], [place], [address] Phone: [phone]   Fax:  [phone]  Occupational Therapy Treatment  Patient Details  Name: Veronica Andrade MRN: [id number] Date of Birth: 11-Feb-1951 Referring Provider: Alma Friendly  Encounter Date: 01/11/2016      OT End of Session - 01/11/16 1651    Visit Number 9   Number of Visits 17   Date for OT Re-Evaluation 02/07/16   Authorization Type [employer] [place] employee   OT Start Time 2585   OT Stop Time 1400   OT Time Calculation (min) 45 min   Activity Tolerance Patient tolerated treatment well      Past Medical History  Diagnosis Date  . Hypertension   . Hyperlipidemia   . Stroke [facility])     Past Surgical History  Procedure Laterality Date  . Knee arthroscopy Left   . Tubal ligation    . Bladder surgery      Bladder Tack    There were no vitals filed for this visit.  Visit Diagnosis:  Lack of coordination due to stroke  Hemiplegia and hemiparesis following cerebral infarction affecting right dominant side ([place])      Subjective Assessment - 01/11/16 1319    Subjective  Those grooved pegs with the tweezers last session was rough!   Patient Stated Goals I want to go back to work, the neurologist hasn't released me   Currently in Pain? No/denies                      OT Treatments/Exercises (OP) - 01/11/16 0001    Fine Motor Coordination   Fine Motor Coordination --   Small Pegboard Pt placing O'Connor pegs in pegboard with tweezers Rt hand with with mod difficulty and drops and extra time, but got better after repetition   Other Fine Motor Exercises Typing 2 paragraphs from magazine article with 11 errors and extra time. Reviewed errors with patient                  OT Short Term Goals - 01/09/16 1134    OT SHORT TERM GOAL #1   Title Pt independent with HEP (due 01/10/16)   Time 4   Period Weeks   Status  Achieved   OT SHORT TERM GOAL #2   Title Pt to eat 90% with Rt dominant hand   Baseline 65%   Time 4   Period Weeks   Status Achieved   OT SHORT TERM GOAL #3   Title Pt to apply makeup 50% of time with Rt dominant hand   Baseline all with Lt hand   Time 4   Period Weeks   Status Achieved   OT SHORT TERM GOAL #4   Title Improve coordination as evidenced by performing 9 hole peg test in 28 sec. or less Rt hand   Baseline 38.43 sec. (Lt = 20.12 sec)   Time 4   Period Weeks   Status Achieved  01/04/16: Rt = 25.59 sec.    OT SHORT TERM GOAL #5   Title Improve grip strength to 58 lbs or greater   Baseline 52 lbs (Lt = 62 lbs)    Time 4   Period Weeks   Status Not Met  01/04/16: Rt = 50 lbs   OT SHORT TERM GOAL #6   Title Improve overall typing to 28 wpm with 80% accuracy or greater   Baseline 14-23 wpm, 24-66% accuracy  Time 4   Period Weeks   Status Partially Met  01/01/16: met with words (32 wpm, 81% accuracy), not met with #'s (25 wpm, 80% accuracy)   OT SHORT TERM GOAL #7   Title Pt to verbalize understanding with sensory impairments Rt hand and safety precautions needed   Time 4   Period Weeks   Status Achieved           OT Long Term Goals - 01/09/16 1134    OT LONG TERM GOAL #1   Title Independent with updated HEP (DUE 02/07/16)    Time 8   Period Weeks   Status On-going   OT LONG TERM GOAL #2   Title Pt to perform all ADLS with Rt hand as dominant hand 90% of the time   Time 8   Period Weeks   Status On-going   OT LONG TERM GOAL #3   Title Pt to return to cooking at Mod I level    Time 8   Period Weeks   Status On-going   OT LONG TERM GOAL #4   Title Pt to write 1/2 page text in reasonable amt of time maintaining 90% legibility   Time 8   Period Weeks   Status On-going   OT LONG TERM GOAL #5   Title Improve overall typing to 35 wpm or > with 90% accuracy    Time 8   Period Weeks   Status On-going               Plan - 01/11/16 1652     Clinical Impression Statement Pt continues to make progress towards LTG's. Pt very motivated to get better   Plan grooved pegboard activity again (pt requested), placing medium sized pegs in pegboard vertical surface, writing on vertical surface   OT Home Exercise Plan 12/13/15: coordination and putty HEP, Rt handed typing words. 12/27/15: Theraband HEP    Consulted and Agree with Plan of Care Patient        Problem List Patient Active Problem List   Diagnosis Date Noted  . Weakness due to cerebrovascular accident 12/27/2015  . Stroke (cerebrum) ([place]) 11/24/2015  . 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 HLD (hyperlipidemia) 04/30/2009  . OTHER MALAISE AND FATIGUE 04/25/2009  . Essential hypertension 03/15/2009  . Allergic rhinitis 03/15/2009  . URINARY INCONTINENCE 03/15/2009    Carey Bullocks, OTR/L  01/11/2016, 4:54 PM  [facility] [address] [place], [place], [address] Phone: [phone]   Fax:  [phone]  Name: Veronica Andrade MRN: [id number] Date of Birth: May 09, 1951

## 2016-01-16 ENCOUNTER — Encounter: Payer: Self-pay | Admitting: Occupational Therapy

## 2016-01-16 ENCOUNTER — Ambulatory Visit: Payer: 59 | Admitting: Occupational Therapy

## 2016-01-16 DIAGNOSIS — R2681 Unsteadiness on feet: Secondary | ICD-10-CM | POA: Diagnosis not present

## 2016-01-16 DIAGNOSIS — IMO0002 Reserved for concepts with insufficient information to code with codable children: Secondary | ICD-10-CM

## 2016-01-16 DIAGNOSIS — R42 Dizziness and giddiness: Secondary | ICD-10-CM | POA: Diagnosis not present

## 2016-01-16 DIAGNOSIS — I69351 Hemiplegia and hemiparesis following cerebral infarction affecting right dominant side: Secondary | ICD-10-CM | POA: Diagnosis not present

## 2016-01-16 DIAGNOSIS — R4189 Other symptoms and signs involving cognitive functions and awareness: Secondary | ICD-10-CM | POA: Diagnosis not present

## 2016-01-16 DIAGNOSIS — M6289 Other specified disorders of muscle: Secondary | ICD-10-CM | POA: Diagnosis not present

## 2016-01-16 DIAGNOSIS — R279 Unspecified lack of coordination: Secondary | ICD-10-CM | POA: Diagnosis not present

## 2016-01-16 DIAGNOSIS — R269 Unspecified abnormalities of gait and mobility: Secondary | ICD-10-CM | POA: Diagnosis not present

## 2016-01-16 DIAGNOSIS — I6980 Unspecified sequelae of other cerebrovascular disease: Secondary | ICD-10-CM | POA: Diagnosis not present

## 2016-01-16 NOTE — Therapy (Signed)
[facility] [address] [place], [place], [address] Phone: [phone]   Fax:  [phone]  Occupational Therapy Treatment  Patient Details  Name: Veronica Andrade MRN: [id number] Date of Birth: 1950-12-23 Referring Provider: Alma Friendly  Encounter Date: 01/16/2016      OT End of Session - 01/16/16 1013    Visit Number 10   Number of Visits 17   Date for OT Re-Evaluation 02/07/16   Authorization Type [employer] [place] employee   OT Start Time 0930   OT Stop Time 1015   OT Time Calculation (min) 45 min   Activity Tolerance Patient tolerated treatment well      Past Medical History  Diagnosis Date  . Hypertension   . Hyperlipidemia   . Stroke [facility])     Past Surgical History  Procedure Laterality Date  . Knee arthroscopy Left   . Tubal ligation    . Bladder surgery      Bladder Tack    There were no vitals filed for this visit.  Visit Diagnosis:  Lack of coordination due to stroke  Hemiplegia and hemiparesis following cerebral infarction affecting right dominant side [facility])      Subjective Assessment - 01/16/16 0941    Subjective  I either have to go back to work 02/15/16 or I may just take early retirement   Patient Stated Goals I want to go back to work, the neurologist hasn't released me   Currently in Pain? No/denies                      OT Treatments/Exercises (OP) - 01/16/16 0001    Fine Motor Coordination   Small Pegboard Placing grooved pegs in pegboard Rt hand with tweezers with mod difficulty and drops. Extra time required.    Functional Reaching Activities   High Level Placing medium sized pegs in pegboard on vertical surface RUE for coordination, control, and strength/endurance with min drops and mod difficulty and fatigue. Pt then tracing over lines, and free writing on high level vertical surface for RUE coordination, control and endurance with min errors and fatigue                  OT Short Term Goals - 01/09/16 1134    OT SHORT TERM GOAL #1   Title Pt independent with HEP (due 01/10/16)   Time 4   Period Weeks   Status Achieved   OT SHORT TERM GOAL #2   Title Pt to eat 90% with Rt dominant hand   Baseline 65%   Time 4   Period Weeks   Status Achieved   OT SHORT TERM GOAL #3   Title Pt to apply makeup 50% of time with Rt dominant hand   Baseline all with Lt hand   Time 4   Period Weeks   Status Achieved   OT SHORT TERM GOAL #4   Title Improve coordination as evidenced by performing 9 hole peg test in 28 sec. or less Rt hand   Baseline 38.43 sec. (Lt = 20.12 sec)   Time 4   Period Weeks   Status Achieved  01/04/16: Rt = 25.59 sec.    OT SHORT TERM GOAL #5   Title Improve grip strength to 58 lbs or greater   Baseline 52 lbs (Lt = 62 lbs)    Time 4   Period Weeks   Status Not Met  01/04/16: Rt = 50 lbs  OT SHORT TERM GOAL #6   Title Improve overall typing to 28 wpm with 80% accuracy or greater   Baseline 14-23 wpm, 24-66% accuracy   Time 4   Period Weeks   Status Partially Met  01/01/16: met with words (32 wpm, 81% accuracy), not met with #'s (25 wpm, 80% accuracy)   OT SHORT TERM GOAL #7   Title Pt to verbalize understanding with sensory impairments Rt hand and safety precautions needed   Time 4   Period Weeks   Status Achieved           OT Long Term Goals - 01/09/16 1134    OT LONG TERM GOAL #1   Title Independent with updated HEP (DUE 02/07/16)    Time 8   Period Weeks   Status On-going   OT LONG TERM GOAL #2   Title Pt to perform all ADLS with Rt hand as dominant hand 90% of the time   Time 8   Period Weeks   Status On-going   OT LONG TERM GOAL #3   Title Pt to return to cooking at Mod I level    Time 8   Period Weeks   Status On-going   OT LONG TERM GOAL #4   Title Pt to write 1/2 page text in reasonable amt of time maintaining 90% legibility   Time 8   Period Weeks   Status On-going   OT LONG TERM  GOAL #5   Title Improve overall typing to 35 wpm or > with 90% accuracy    Time 8   Period Weeks   Status On-going               Plan - 01/16/16 1013    Clinical Impression Statement Pt continues to make progress, however fatigues quickly with high level coordination tasks   Plan typing games, typing test, writing, high level coordination with small pegboard   OT Home Exercise Plan 12/13/15: coordination and putty HEP, Rt handed typing words. 12/27/15: Theraband HEP    Consulted and Agree with Plan of Care Patient        Problem List Patient Active Problem List   Diagnosis Date Noted  . Weakness due to cerebrovascular accident 12/27/2015  . Stroke (cerebrum) ([place]) 11/24/2015  . 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 HLD (hyperlipidemia) 04/30/2009  . OTHER MALAISE AND FATIGUE 04/25/2009  . Essential hypertension 03/15/2009  . Allergic rhinitis 03/15/2009  . URINARY INCONTINENCE 03/15/2009    Carey Bullocks, OTR/L 01/16/2016, 10:15 AM  [facility] [address] [place], [place], [address] Phone: [phone]   Fax:  [phone]  Name: Veronica Andrade MRN: [id number] Date of Birth: 1951/10/15

## 2016-01-18 ENCOUNTER — Encounter: Payer: Self-pay | Admitting: Occupational Therapy

## 2016-01-18 ENCOUNTER — Ambulatory Visit: Payer: 59 | Admitting: Occupational Therapy

## 2016-01-18 DIAGNOSIS — R449 Unspecified symptoms and signs involving general sensations and perceptions: Secondary | ICD-10-CM

## 2016-01-18 DIAGNOSIS — R42 Dizziness and giddiness: Secondary | ICD-10-CM | POA: Diagnosis not present

## 2016-01-18 DIAGNOSIS — R4189 Other symptoms and signs involving cognitive functions and awareness: Secondary | ICD-10-CM | POA: Diagnosis not present

## 2016-01-18 DIAGNOSIS — M6289 Other specified disorders of muscle: Secondary | ICD-10-CM | POA: Diagnosis not present

## 2016-01-18 DIAGNOSIS — R29898 Other symptoms and signs involving the musculoskeletal system: Secondary | ICD-10-CM

## 2016-01-18 DIAGNOSIS — R279 Unspecified lack of coordination: Secondary | ICD-10-CM | POA: Diagnosis not present

## 2016-01-18 DIAGNOSIS — I6980 Unspecified sequelae of other cerebrovascular disease: Secondary | ICD-10-CM | POA: Diagnosis not present

## 2016-01-18 DIAGNOSIS — I69351 Hemiplegia and hemiparesis following cerebral infarction affecting right dominant side: Secondary | ICD-10-CM | POA: Diagnosis not present

## 2016-01-18 DIAGNOSIS — R269 Unspecified abnormalities of gait and mobility: Secondary | ICD-10-CM | POA: Diagnosis not present

## 2016-01-18 DIAGNOSIS — R2681 Unsteadiness on feet: Secondary | ICD-10-CM | POA: Diagnosis not present

## 2016-01-18 NOTE — Therapy (Signed)
[facility] [address] [place], [place], [address] Phone: [phone]   Fax:  [phone]  Occupational Therapy Treatment  Patient Details  Name: Veronica Andrade MRN: [id number] Date of Birth: [birth year] Referring Provider: Alma Friendly  Encounter Date: 01/18/2016      OT End of Session - 01/18/16 1039    Visit Number 11   Number of Visits 17   Date for OT Re-Evaluation 02/07/16   Authorization Type [employer] employee   OT Start Time [id number]   OT Stop Time 1017   OT Time Calculation (min) 44 min   Activity Tolerance Patient tolerated treatment well   Behavior During Therapy [facility] for tasks assessed/performed      Past Medical History  Diagnosis Date  . Hypertension   . Hyperlipidemia   . Stroke [facility])     Past Surgical History  Procedure Laterality Date  . Knee arthroscopy Left   . Tubal ligation    . Bladder surgery      Bladder Tack    There were no vitals filed for this visit.  Visit Diagnosis:  Hemiplegia and hemiparesis following cerebral infarction affecting right dominant side (HCC)  Right-sided lack of sensation  Weakness of right hand                    OT Treatments/Exercises (OP) - 01/18/16 0001    ADLs   Eating Patient is using her right hand for feeding, however, reports difficulty managing a styrofoam or plastic cup.  Patient has difficulty grading muscle force when utilizing compliant surface.  Practiced using visual cues to gauge force necessary to drink from foam cup.  Patient needed cueing to decrease trunk and shoulder motion used for distal UE function.     Fine Motor Coordination   In Hand Manipulation Training Manipulating coins within hand , with and without visual input.     Neurological Re-education Exercises   Other Exercises 1 With all activity today encouraged decreased trunk, head/neck, and shoulder activation for distal function of right UE.   Patient defaults to gross motor movements with new or perceived difficult tasks - e.g. digiflex, hand gripper.  With verbal cueing, patient able to modulate motor performance.     Sensation Exercises   Stereognosis Worked on identifying coins in pocket by feel, patient beginning to be able to determine size difference in coins by feel.  Patient with report of increased sensory awareness, tingling, itching in radial, median nerve distribution, patient with diminished sensation (relative to inner arm) in ulnar nerve distribtuion.  This follows with motor control as well as she has greatest difficulty coordinationg 4th and 5th digit for function.                  OT Education - 01/18/16 1038    Education provided Yes   Education Details grading force of hand for task - using visual cues (e.g. styrofoam cup)   Person(s) Educated Patient   Methods Explanation;Demonstration   Comprehension Verbalized understanding;Verbal cues required;Need further instruction          OT Short Term Goals - 01/09/16 1134    OT SHORT TERM GOAL #1   Title Pt independent with HEP (due 01/10/16)   Time 4   Period Weeks   Status Achieved   OT SHORT TERM GOAL #2   Title Pt to eat 90% with Rt dominant hand   Baseline 65%   Time 4  Period Weeks   Status Achieved   OT SHORT TERM GOAL #3   Title Pt to apply makeup 50% of time with Rt dominant hand   Baseline all with Lt hand   Time 4   Period Weeks   Status Achieved   OT SHORT TERM GOAL #4   Title Improve coordination as evidenced by performing 9 hole peg test in 28 sec. or less Rt hand   Baseline 38.43 sec. (Lt = 20.12 sec)   Time 4   Period Weeks   Status Achieved  01/04/16: Rt = 25.59 sec.    OT SHORT TERM GOAL #5   Title Improve grip strength to 58 lbs or greater   Baseline 52 lbs (Lt = 62 lbs)    Time 4   Period Weeks   Status Not Met  01/04/16: Rt = 50 lbs   OT SHORT TERM GOAL #6   Title Improve overall typing to 28 wpm with 80% accuracy or  greater   Baseline 14-23 wpm, 24-66% accuracy   Time 4   Period Weeks   Status Partially Met  01/01/16: met with words (32 wpm, 81% accuracy), not met with #'s (25 wpm, 80% accuracy)   OT SHORT TERM GOAL #7   Title Pt to verbalize understanding with sensory impairments Rt hand and safety precautions needed   Time 4   Period Weeks   Status Achieved           OT Long Term Goals - 01/09/16 1134    OT LONG TERM GOAL #1   Title Independent with updated HEP (DUE 02/07/16)    Time 8   Period Weeks   Status On-going   OT LONG TERM GOAL #2   Title Pt to perform all ADLS with Rt hand as dominant hand 90% of the time   Time 8   Period Weeks   Status On-going   OT LONG TERM GOAL #3   Title Pt to return to cooking at Mod I level    Time 8   Period Weeks   Status On-going   OT LONG TERM GOAL #4   Title Pt to write 1/2 page text in reasonable amt of time maintaining 90% legibility   Time 8   Period Weeks   Status On-going   OT LONG TERM GOAL #5   Title Improve overall typing to 35 wpm or > with 90% accuracy    Time 8   Period Weeks   Status On-going               Plan - 01/18/16 1039    Clinical Impression Statement Patient showing continued progress toward remaining OT goals.  Patient very motivated for improved function.   Pt will benefit from skilled therapeutic intervention in order to improve on the following deficits (Retired) Decreased coordination;Decreased endurance;Decreased Surveyor, mining;Impaired sensation;Decreased activity tolerance;Decreased knowledge of use of DME;Impaired UE functional use;Decreased mobility;Decreased strength   Rehab Potential Good   OT Frequency 2x / week   OT Duration 8 weeks   OT Treatment/Interventions Self-care/ADL training;Patient/family education;Therapeutic exercise;Functional Mobility Training;Neuromuscular education;Manual Therapy;DME and/or AE instruction;Parrafin;Therapeutic activities;Fluidtherapy;Moist Heat;Passive range of  motion;Cognitive remediation/compensation;Visual/perceptual remediation/compensation   Plan high level coordination, hand strengthening, grading pressure, typing games, handwriting   Consulted and Agree with Plan of Care Patient        Problem List Patient Active Problem List   Diagnosis Date Noted  . Weakness due to cerebrovascular accident 12/27/2015  . Stroke (cerebrum) ([place]) 11/24/2015  .  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 HLD (hyperlipidemia) 04/30/2009  . OTHER MALAISE AND FATIGUE 04/25/2009  . Essential hypertension 03/15/2009  . Allergic rhinitis 03/15/2009  . URINARY INCONTINENCE 03/15/2009    Mariah Milling, OTR/L 01/18/2016, 10:42 AM  [facility] [address] [place], [place], [address] Phone: [phone]   Fax:  [phone]  Name: Veronica Andrade MRN: [id number] Date of Birth: [birth year]

## 2016-01-22 ENCOUNTER — Telehealth: Payer: Self-pay | Admitting: Neurology

## 2016-01-22 ENCOUNTER — Ambulatory Visit: Payer: 59 | Admitting: Occupational Therapy

## 2016-01-22 DIAGNOSIS — I69351 Hemiplegia and hemiparesis following cerebral infarction affecting right dominant side: Secondary | ICD-10-CM

## 2016-01-22 DIAGNOSIS — R4189 Other symptoms and signs involving cognitive functions and awareness: Secondary | ICD-10-CM

## 2016-01-22 DIAGNOSIS — R2681 Unsteadiness on feet: Secondary | ICD-10-CM | POA: Diagnosis not present

## 2016-01-22 DIAGNOSIS — R29898 Other symptoms and signs involving the musculoskeletal system: Secondary | ICD-10-CM

## 2016-01-22 DIAGNOSIS — R449 Unspecified symptoms and signs involving general sensations and perceptions: Secondary | ICD-10-CM

## 2016-01-22 DIAGNOSIS — R279 Unspecified lack of coordination: Secondary | ICD-10-CM | POA: Diagnosis not present

## 2016-01-22 DIAGNOSIS — M6289 Other specified disorders of muscle: Secondary | ICD-10-CM | POA: Diagnosis not present

## 2016-01-22 DIAGNOSIS — IMO0002 Reserved for concepts with insufficient information to code with codable children: Secondary | ICD-10-CM

## 2016-01-22 DIAGNOSIS — I6980 Unspecified sequelae of other cerebrovascular disease: Secondary | ICD-10-CM | POA: Diagnosis not present

## 2016-01-22 DIAGNOSIS — R269 Unspecified abnormalities of gait and mobility: Secondary | ICD-10-CM | POA: Diagnosis not present

## 2016-01-22 DIAGNOSIS — R42 Dizziness and giddiness: Secondary | ICD-10-CM | POA: Diagnosis not present

## 2016-01-22 NOTE — Therapy (Signed)
[facility] [address] [place], [place], [address] Phone: [phone]   Fax:  [phone]  Occupational Therapy Treatment  Patient Details  Name: Veronica Andrade MRN: [id number] Date of Birth: 01-31-1951 Referring Provider: Alma Friendly  Encounter Date: 01/22/2016      OT End of Session - 01/22/16 1154    Visit Number 12   Number of Visits 17   Date for OT Re-Evaluation 02/07/16   Authorization Type [employer] [place] employee   OT Start Time 1200  Pt on phone call at beginning of session   OT Stop Time 1238   OT Time Calculation (min) 38 min   Activity Tolerance Patient tolerated treatment well   Behavior During Therapy [facility] for tasks assessed/performed      Past Medical History  Diagnosis Date  . Hypertension   . Hyperlipidemia   . Stroke [facility])     Past Surgical History  Procedure Laterality Date  . Knee arthroscopy Left   . Tubal ligation    . Bladder surgery      Bladder Tack    There were no vitals filed for this visit.  Visit Diagnosis:  Hemiplegia and hemiparesis following cerebral infarction affecting right dominant side (HCC)  Right-sided lack of sensation  Weakness of right hand  Lack of coordination due to stroke                    OT Treatments/Exercises (OP) - 01/22/16 0001    ADLs   Writing Practiced writing 5 sentences with 95+% legibility and min incr in time.   Work Typing test with 72% accuracy and 29wpm.  Then test with 34 wpm and 84% accuracy (but less numbers/caps in this test).  Then typing game "clouds" with score of 986.  Encouraged pt to continue to work on 5th digit finger adduction.   Fine Motor Coordination   Fine Motor Coordination Small Pegboard   Small Pegboard Functional reaching to place small pegs in vertical pegboard for incr coordination without drops (incr time, min difficulty). Pt removed 3 at a time with in-hand manipulation with 1 drop and  min difficulty.  Min v.c. For compensation/shoulder hike.                  OT Short Term Goals - 01/09/16 1134    OT SHORT TERM GOAL #1   Title Pt independent with HEP (due 01/10/16)   Time 4   Period Weeks   Status Achieved   OT SHORT TERM GOAL #2   Title Pt to eat 90% with Rt dominant hand   Baseline 65%   Time 4   Period Weeks   Status Achieved   OT SHORT TERM GOAL #3   Title Pt to apply makeup 50% of time with Rt dominant hand   Baseline all with Lt hand   Time 4   Period Weeks   Status Achieved   OT SHORT TERM GOAL #4   Title Improve coordination as evidenced by performing 9 hole peg test in 28 sec. or less Rt hand   Baseline 38.43 sec. (Lt = 20.12 sec)   Time 4   Period Weeks   Status Achieved  01/04/16: Rt = 25.59 sec.    OT SHORT TERM GOAL #5   Title Improve grip strength to 58 lbs or greater   Baseline 52 lbs (Lt = 62 lbs)    Time 4   Period Weeks   Status  Not Met  01/04/16: Rt = 50 lbs   OT SHORT TERM GOAL #6   Title Improve overall typing to 28 wpm with 80% accuracy or greater   Baseline 14-23 wpm, 24-66% accuracy   Time 4   Period Weeks   Status Partially Met  01/01/16: met with words (32 wpm, 81% accuracy), not met with #'s (25 wpm, 80% accuracy)   OT SHORT TERM GOAL #7   Title Pt to verbalize understanding with sensory impairments Rt hand and safety precautions needed   Time 4   Period Weeks   Status Achieved           OT Long Term Goals - 01/09/16 1134    OT LONG TERM GOAL #1   Title Independent with updated HEP (DUE 02/07/16)    Time 8   Period Weeks   Status On-going   OT LONG TERM GOAL #2   Title Pt to perform all ADLS with Rt hand as dominant hand 90% of the time   Time 8   Period Weeks   Status On-going   OT LONG TERM GOAL #3   Title Pt to return to cooking at Mod I level    Time 8   Period Weeks   Status On-going   OT LONG TERM GOAL #4   Title Pt to write 1/2 page text in reasonable amt of time maintaining 90% legibility    Time 8   Period Weeks   Status On-going   OT LONG TERM GOAL #5   Title Improve overall typing to 35 wpm or > with 90% accuracy    Time 8   Period Weeks   Status On-going               Plan - 01/22/16 1246    Clinical Impression Statement Pt progressing with RUE coordination and demo improved writing, but continues to demo difficulty typing particularly with 4-5th digits.   Pt will benefit from skilled therapeutic intervention in order to improve on the following deficits (Retired) Decreased coordination;Decreased endurance;Decreased Surveyor, mining;Impaired sensation;Decreased activity tolerance;Decreased knowledge of use of DME;Impaired UE functional use;Decreased mobility;Decreased strength   Rehab Potential Good   OT Frequency 2x / week   OT Duration 8 weeks   OT Treatment/Interventions Self-care/ADL training;Patient/family education;Therapeutic exercise;Functional Mobility Training;Neuromuscular education;Manual Therapy;DME and/or AE instruction;Parrafin;Therapeutic activities;Fluidtherapy;Moist Heat;Passive range of motion;Cognitive remediation/compensation;Visual/perceptual remediation/compensation   Plan continue with high-level coordination, hand strengthening, grading pressure   OT Home Exercise Plan 12/13/15: coordination and putty HEP, Rt handed typing words. 12/27/15: Theraband HEP    Consulted and Agree with Plan of Care Patient        Problem List Patient Active Problem List   Diagnosis Date Noted  . Weakness due to cerebrovascular accident 12/27/2015  . Stroke (cerebrum) ([place]) 11/24/2015  . 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 HLD (hyperlipidemia) 04/30/2009  . OTHER MALAISE AND FATIGUE 04/25/2009  . Essential hypertension 03/15/2009  . Allergic rhinitis 03/15/2009  . URINARY INCONTINENCE 03/15/2009    [facility] 01/22/2016, 12:48 PM  [facility] [address] [place],  [place], [address] Phone: [phone]   Fax:  [phone]  Name: Veronica Andrade MRN: [id number] Date of Birth: 12/13/1950  Vianne Bulls, OTR/L [facility] [address] [place], [place]  [address] [phone] phone [phone] 01/22/2016 12:48 PM

## 2016-01-22 NOTE — Telephone Encounter (Signed)
Patient stopped by saying she is filling for disability through [employer] and she came by and gave me the [place] Disability claim number. The number is [id number]. She said that if [employer] contacts [place] about any information about her stroke that we would have her claim number. The best number to contact that patient is [phone]

## 2016-01-22 NOTE — Telephone Encounter (Signed)
Rn call patient back about her disability form. Pt states her FMLA ends 02/15/2016. Pt states she will be filing for disability with [employer]. Pt is currently in therapy at [facility]. Rn stated if she is filing for disability there is a 25.00 charge, and a release from has to be submitted. Rn cancel her appt on Mar 26, 2016 with NP. Pt was schedule for a earlier appt on 02/13/2016. Rn explain that we need to have form so Dr.Xu can evaluate her. Pt verbalized understanding, and will be at the appt. She was happy with getting a earlier appt.

## 2016-01-24 MED FILL — ATORVASTATIN 40 MG TABLET: 40 | 90 days supply | Qty: 90 | Fill #0

## 2016-01-26 ENCOUNTER — Ambulatory Visit: Payer: 59 | Admitting: *Deleted

## 2016-01-29 ENCOUNTER — Ambulatory Visit: Payer: 59 | Attending: Primary Care | Admitting: Occupational Therapy

## 2016-01-29 DIAGNOSIS — R279 Unspecified lack of coordination: Secondary | ICD-10-CM | POA: Insufficient documentation

## 2016-01-29 DIAGNOSIS — I6980 Unspecified sequelae of other cerebrovascular disease: Secondary | ICD-10-CM | POA: Diagnosis not present

## 2016-01-29 DIAGNOSIS — I69351 Hemiplegia and hemiparesis following cerebral infarction affecting right dominant side: Secondary | ICD-10-CM | POA: Insufficient documentation

## 2016-01-29 DIAGNOSIS — R449 Unspecified symptoms and signs involving general sensations and perceptions: Secondary | ICD-10-CM

## 2016-01-29 DIAGNOSIS — R4189 Other symptoms and signs involving cognitive functions and awareness: Secondary | ICD-10-CM | POA: Diagnosis not present

## 2016-01-29 DIAGNOSIS — IMO0002 Reserved for concepts with insufficient information to code with codable children: Secondary | ICD-10-CM

## 2016-01-29 NOTE — Patient Instructions (Addendum)
   Coordination Activities:  1. Picking up small objects and placing in bowl using ring finger/thumb and little finger/thumb  2.  Rotate 2 golf balls in hand  3.  Flip card between each finger and thumb  4.  Place putty between ring finger and little finger and squeeze fingers together

## 2016-01-29 NOTE — Therapy (Signed)
[facility] [address] [place], [place], [address] Phone: [phone]   Fax:  [phone]  Occupational Therapy Treatment  Patient Details  Name: Veronica Andrade MRN: [id number] Date of Birth: Apr 03, 1951 Referring Provider: Alma Friendly  Encounter Date: 01/29/2016      OT End of Session - 01/29/16 0851    Visit Number 13   Number of Visits 17   Date for OT Re-Evaluation 02/07/16   Authorization Type [employer] - [employer] employee   OT Start Time 0848   OT Stop Time 0930   OT Time Calculation (min) 42 min   Activity Tolerance Patient tolerated treatment well   Behavior During Therapy [facility] for tasks assessed/performed      Past Medical History  Diagnosis Date  . Hypertension   . Hyperlipidemia   . Stroke [facility])     Past Surgical History  Procedure Laterality Date  . Knee arthroscopy Left   . Tubal ligation    . Bladder surgery      Bladder Tack    There were no vitals filed for this visit.  Visit Diagnosis:  Hemiplegia and hemiparesis following cerebral infarction affecting right dominant side (HCC)  Right-sided lack of sensation  Lack of coordination due to stroke      Subjective Assessment - 01/29/16 0850    Subjective  Pt reports cold last week and has been coughing a lot over the weekend   Patient Stated Goals I want to go back to work, the neurologist hasn't released me   Currently in Pain? Yes   Pain Score 7    Pain Location --  chest and back from coughing   Pain Orientation Right   Pain Descriptors / Indicators Sore   Pain Type Acute pain   Pain Onset In the past 7 days   Aggravating Factors  coughing   Pain Relieving Factors rest                      OT Treatments/Exercises (OP) - 01/29/16 0001    Fine Motor Coordination   Fine Motor Coordination O'Connor pegs;In hand manipuation training   In Hand Manipulation Training Rotating relaxation balls in R hand with min  difficulty/drops.   Small Pegboard Placing pegs in pegboard with each finger/thumb with min-mod difficulty for incr coordination.     O'Connor pegs Placing pegs in pegboard with tweezers with R hand with min difficulty and 1 rest break with fatigue   Other Fine Motor Exercises Flipping card between each finger for incr 4-5th digit coordination with min-mod difficulty                OT Education - 01/29/16 0911    Education Details Added coordination ex for incr 4-5th digit involvement--see pt instructions   Person(s) Educated Patient   Methods Explanation;Demonstration;Verbal cues;Handout   Comprehension Verbalized understanding;Returned demonstration;Verbal cues required          OT Short Term Goals - 01/09/16 1134    OT SHORT TERM GOAL #1   Title Pt independent with HEP (due 01/10/16)   Time 4   Period Weeks   Status Achieved   OT SHORT TERM GOAL #2   Title Pt to eat 90% with Rt dominant hand   Baseline 65%   Time 4   Period Weeks   Status Achieved   OT SHORT TERM GOAL #3   Title Pt to apply makeup 50% of time with Rt dominant hand  Baseline all with Lt hand   Time 4   Period Weeks   Status Achieved   OT SHORT TERM GOAL #4   Title Improve coordination as evidenced by performing 9 hole peg test in 28 sec. or less Rt hand   Baseline 38.43 sec. (Lt = 20.12 sec)   Time 4   Period Weeks   Status Achieved  01/04/16: Rt = 25.59 sec.    OT SHORT TERM GOAL #5   Title Improve grip strength to 58 lbs or greater   Baseline 52 lbs (Lt = 62 lbs)    Time 4   Period Weeks   Status Not Met  01/04/16: Rt = 50 lbs   OT SHORT TERM GOAL #6   Title Improve overall typing to 28 wpm with 80% accuracy or greater   Baseline 14-23 wpm, 24-66% accuracy   Time 4   Period Weeks   Status Partially Met  01/01/16: met with words (32 wpm, 81% accuracy), not met with #'s (25 wpm, 80% accuracy)   OT SHORT TERM GOAL #7   Title Pt to verbalize understanding with sensory impairments Rt hand and  safety precautions needed   Time 4   Period Weeks   Status Achieved           OT Long Term Goals - 01/29/16 7371    OT LONG TERM GOAL #1   Title Independent with updated HEP (DUE 02/07/16)    Time 8   Period Weeks   Status On-going   OT LONG TERM GOAL #2   Title Pt to perform all ADLS with Rt hand as dominant hand 90% of the time   Time 8   Period Weeks   Status Achieved  01/29/16 per pt report   OT LONG TERM GOAL #3   Title Pt to return to cooking at Mod I level    Time 8   Period Weeks   Status On-going   OT LONG TERM GOAL #4   Title Pt to write 1/2 page text in reasonable amt of time maintaining 90% legibility   Time 8   Period Weeks   Status On-going   OT LONG TERM GOAL #5   Title Improve overall typing to 35 wpm or > with 90% accuracy    Time 8   Period Weeks   Status On-going               Plan - 01/29/16 0626    Clinical Impression Statement Pt is progressing with RUE coordination and RUE functional use, but decr sensation continues to be a barrier.   Plan continue with high-level coordiation, hand strengthening, grading pressue   OT Home Exercise Plan 12/13/15: coordination and putty HEP, Rt handed typing words. 12/27/15: Theraband HEP    Consulted and Agree with Plan of Care Patient        Problem List Patient Active Problem List   Diagnosis Date Noted  . Weakness due to cerebrovascular accident 12/27/2015  . Stroke (cerebrum) ([place]) 11/24/2015  . 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 HLD (hyperlipidemia) 04/30/2009  . OTHER MALAISE AND FATIGUE 04/25/2009  . Essential hypertension 03/15/2009  . Allergic rhinitis 03/15/2009  . URINARY INCONTINENCE 03/15/2009    [facility] 01/29/2016, 9:32 AM  [facility] [address] [place], [place], [address] Phone: [phone]   Fax:  [phone]  Name: Veronica Andrade MRN: [id number] Date of Birth: [birth year]  Vianne Bulls, OTR/L [facility] [address] [place], [place]  [address] [phone] phone [phone] 01/29/2016 9:32 AM

## 2016-01-31 ENCOUNTER — Ambulatory Visit: Payer: 59 | Admitting: Occupational Therapy

## 2016-01-31 ENCOUNTER — Encounter: Payer: Self-pay | Admitting: Occupational Therapy

## 2016-01-31 DIAGNOSIS — R4189 Other symptoms and signs involving cognitive functions and awareness: Secondary | ICD-10-CM

## 2016-01-31 DIAGNOSIS — IMO0002 Reserved for concepts with insufficient information to code with codable children: Secondary | ICD-10-CM

## 2016-01-31 DIAGNOSIS — I69351 Hemiplegia and hemiparesis following cerebral infarction affecting right dominant side: Secondary | ICD-10-CM

## 2016-01-31 DIAGNOSIS — I6980 Unspecified sequelae of other cerebrovascular disease: Secondary | ICD-10-CM | POA: Diagnosis not present

## 2016-01-31 DIAGNOSIS — R449 Unspecified symptoms and signs involving general sensations and perceptions: Secondary | ICD-10-CM

## 2016-01-31 DIAGNOSIS — R279 Unspecified lack of coordination: Secondary | ICD-10-CM | POA: Diagnosis not present

## 2016-01-31 NOTE — Therapy (Signed)
[facility] [address] [place], [place], [address] Phone: [phone]   Fax:  [phone]  Occupational Therapy Treatment  Patient Details  Name: Veronica Andrade MRN: [id number] Date of Birth: November 12, 1951 Referring Provider: Alma Friendly  Encounter Date: 01/31/2016      OT End of Session - 01/31/16 1150    Visit Number 14   Number of Visits 17   Date for OT Re-Evaluation 02/07/16   Authorization Type [employer] - [employer] employee   OT Start Time 1100   OT Stop Time 1145   OT Time Calculation (min) 45 min   Activity Tolerance Patient tolerated treatment well      Past Medical History  Diagnosis Date  . Hypertension   . Hyperlipidemia   . Stroke [facility])     Past Surgical History  Procedure Laterality Date  . Knee arthroscopy Left   . Tubal ligation    . Bladder surgery      Bladder Tack    There were no vitals filed for this visit.  Visit Diagnosis:  Lack of coordination due to stroke  Right-sided lack of sensation  Hemiplegia and hemiparesis following cerebral infarction affecting right dominant side (HCC)      Subjective Assessment - 01/31/16 1108    Subjective  I've been sick and coughing a lot. My face and hand or so sensitive that I can't stand any touch   Patient Stated Goals I want to go back to work, the neurologist hasn't released me   Currently in Pain? Yes   Pain Score 3    Pain Location --  Chest and ribcage from coughing   Pain Orientation Right   Pain Descriptors / Indicators Sore   Pain Type Acute pain   Pain Onset In the past 7 days   Pain Frequency Intermittent   Aggravating Factors  coughing   Pain Relieving Factors rest                      OT Treatments/Exercises (OP) - 01/31/16 0001    ADLs   ADL Comments Pt reports increased sensitivity on Rt hand and Rt side of face. Pt issued densensitization techniques for Rt hand and reviewed.    Fine Motor  Coordination   Other Fine Motor Exercises Typing test: #'s only 18 wpm, 75% accuracy. Typing test: words 39 wpm, 87% accuracy. Typing test with words and some #'s 32 wpm. Typing games: bubbles (lower case letters) and clouds (#'s and easy words)                   OT Short Term Goals - 01/09/16 1134    OT SHORT TERM GOAL #1   Title Pt independent with HEP (due 01/10/16)   Time 4   Period Weeks   Status Achieved   OT SHORT TERM GOAL #2   Title Pt to eat 90% with Rt dominant hand   Baseline 65%   Time 4   Period Weeks   Status Achieved   OT SHORT TERM GOAL #3   Title Pt to apply makeup 50% of time with Rt dominant hand   Baseline all with Lt hand   Time 4   Period Weeks   Status Achieved   OT SHORT TERM GOAL #4   Title Improve coordination as evidenced by performing 9 hole peg test in 28 sec. or less Rt hand   Baseline 38.43 sec. (Lt = 20.12 sec)  Time 4   Period Weeks   Status Achieved  01/04/16: Rt = 25.59 sec.    OT SHORT TERM GOAL #5   Title Improve grip strength to 58 lbs or greater   Baseline 52 lbs (Lt = 62 lbs)    Time 4   Period Weeks   Status Not Met  01/04/16: Rt = 50 lbs   OT SHORT TERM GOAL #6   Title Improve overall typing to 28 wpm with 80% accuracy or greater   Baseline 14-23 wpm, 24-66% accuracy   Time 4   Period Weeks   Status Partially Met  01/01/16: met with words (32 wpm, 81% accuracy), not met with #'s (25 wpm, 80% accuracy)   OT SHORT TERM GOAL #7   Title Pt to verbalize understanding with sensory impairments Rt hand and safety precautions needed   Time 4   Period Weeks   Status Achieved           OT Long Term Goals - 01/29/16 9211    OT LONG TERM GOAL #1   Title Independent with updated HEP (DUE 02/07/16)    Time 8   Period Weeks   Status On-going   OT LONG TERM GOAL #2   Title Pt to perform all ADLS with Rt hand as dominant hand 90% of the time   Time 8   Period Weeks   Status Achieved  01/29/16 per pt report   OT LONG TERM  GOAL #3   Title Pt to return to cooking at Mod I level    Time 8   Period Weeks   Status On-going   OT LONG TERM GOAL #4   Title Pt to write 1/2 page text in reasonable amt of time maintaining 90% legibility   Time 8   Period Weeks   Status On-going   OT LONG TERM GOAL #5   Title Improve overall typing to 35 wpm or > with 90% accuracy    Time 8   Period Weeks   Status On-going               Plan - 01/31/16 1151    Clinical Impression Statement Pt with hypersensitivity on RUE and face. Pt with slightly improved typing and accracy on words, but declined slightly with numbers.    Plan continue with high-level coordination, hand strengthening, grading pressure   OT Home Exercise Plan 12/13/15: coordination and putty HEP, Rt handed typing words. 12/27/15: Theraband HEP    Consulted and Agree with Plan of Care Patient        Problem List Patient Active Problem List   Diagnosis Date Noted  . Weakness due to cerebrovascular accident 12/27/2015  . Stroke (cerebrum) ([place]) 11/24/2015  . 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 HLD (hyperlipidemia) 04/30/2009  . OTHER MALAISE AND FATIGUE 04/25/2009  . Essential hypertension 03/15/2009  . Allergic rhinitis 03/15/2009  . URINARY INCONTINENCE 03/15/2009    Carey Bullocks, OTR/L 01/31/2016, 11:52 AM  [facility] [address] [place], [place], [address] Phone: [phone]   Fax:  [phone]  Name: Veronica Andrade MRN: [id number] Date of Birth: [birth year]

## 2016-02-01 ENCOUNTER — Encounter: Payer: 59 | Admitting: Occupational Therapy

## 2016-02-06 ENCOUNTER — Encounter: Payer: Self-pay | Admitting: Occupational Therapy

## 2016-02-06 ENCOUNTER — Ambulatory Visit: Payer: 59 | Admitting: Occupational Therapy

## 2016-02-06 DIAGNOSIS — I69351 Hemiplegia and hemiparesis following cerebral infarction affecting right dominant side: Secondary | ICD-10-CM | POA: Diagnosis not present

## 2016-02-06 DIAGNOSIS — I6980 Unspecified sequelae of other cerebrovascular disease: Secondary | ICD-10-CM | POA: Diagnosis not present

## 2016-02-06 DIAGNOSIS — R279 Unspecified lack of coordination: Secondary | ICD-10-CM | POA: Diagnosis not present

## 2016-02-06 DIAGNOSIS — IMO0002 Reserved for concepts with insufficient information to code with codable children: Secondary | ICD-10-CM

## 2016-02-06 DIAGNOSIS — R4189 Other symptoms and signs involving cognitive functions and awareness: Secondary | ICD-10-CM | POA: Diagnosis not present

## 2016-02-06 NOTE — Therapy (Signed)
[facility] [address] [place], [place], [address] Phone: [phone]   Fax:  [phone]  Occupational Therapy Treatment  Patient Details  Name: Veronica Andrade MRN: [id number] Date of Birth: 1951/09/17 Referring Provider: Alma Friendly  Encounter Date: 02/06/2016      OT End of Session - 02/06/16 0932    Visit Number 15   Number of Visits 17   Date for OT Re-Evaluation 02/07/16   Authorization Type [employer] - [employer] employee   OT Start Time 0845   OT Stop Time 0930   OT Time Calculation (min) 45 min   Activity Tolerance Patient tolerated treatment well      Past Medical History  Diagnosis Date  . Hypertension   . Hyperlipidemia   . Stroke [facility])     Past Surgical History  Procedure Laterality Date  . Knee arthroscopy Left   . Tubal ligation    . Bladder surgery      Bladder Tack    There were no vitals filed for this visit.  Visit Diagnosis:  Lack of coordination due to stroke  Hemiplegia and hemiparesis following cerebral infarction affecting right dominant side [facility])      Subjective Assessment - 02/06/16 0853    Subjective  I feel like the desensitization techniques have helped   Patient Stated Goals I want to go back to work, the neurologist hasn't released me   Currently in Pain? No/denies                      OT Treatments/Exercises (OP) - 02/06/16 0001    Fine Motor Coordination   Fine Motor Coordination Handwriting   Small Pegboard Placing small pegs in pegboard manipulating 5 pegs at a time for in hand manipulation with min drops Rt hand.    Handwriting Pt wrote 1/2 page of text with 90% legibility in reasonable amt of time with reported fatigue. Pt reports writing bigger than she did prior to CVA   Other Fine Motor Exercises manipulating stress balls with mod difficulty Rt hand   Other Fine Motor Exercises Pt placing grooved pegs in pegboard with tweezers Rt hand  with min to mod difficulty for control and coordination.                   OT Short Term Goals - 02/06/16 0907    OT SHORT TERM GOAL #1   Title Pt independent with HEP (due 01/10/16)   Time 4   Period Weeks   Status Achieved   OT SHORT TERM GOAL #2   Title Pt to eat 90% with Rt dominant hand   Baseline 65%   Time 4   Period Weeks   Status Achieved   OT SHORT TERM GOAL #3   Title Pt to apply makeup 50% of time with Rt dominant hand   Baseline all with Lt hand   Time 4   Period Weeks   Status Achieved   OT SHORT TERM GOAL #4   Title Improve coordination as evidenced by performing 9 hole peg test in 28 sec. or less Rt hand   Baseline 38.43 sec. (Lt = 20.12 sec)   Time 4   Period Weeks   Status Achieved  01/04/16: Rt = 25.59 sec.    OT SHORT TERM GOAL #5   Title Improve grip strength to 58 lbs or greater   Baseline 52 lbs (Lt = 62 lbs)    Time 4  Period Weeks   Status Achieved  01/04/16: Rt = 50 lbs, 02/06/16 Rt = 60 lbs   OT SHORT TERM GOAL #6   Title Improve overall typing to 28 wpm with 80% accuracy or greater   Baseline 14-23 wpm, 24-66% accuracy   Time 4   Period Weeks   Status Achieved  )   OT SHORT TERM GOAL #7   Title Pt to verbalize understanding with sensory impairments Rt hand and safety precautions needed   Time 4   Period Weeks   Status Achieved           OT Long Term Goals - 02/06/16 0900    OT LONG TERM GOAL #1   Title Independent with updated HEP (DUE 02/07/16)    Time 8   Period Weeks   Status Achieved   OT LONG TERM GOAL #2   Title Pt to perform all ADLS with Rt hand as dominant hand 90% of the time   Time 8   Period Weeks   Status Achieved  01/29/16 per pt report   OT LONG TERM GOAL #3   Title Pt to return to cooking at Mod I level    Time 8   Period Weeks   Status Partially Met  met with supervision, except getting items out of oven for safety reasons d/t altered sensation   OT LONG TERM GOAL #4   Title Pt to write 1/2 page  text in reasonable amt of time maintaining 90% legibility   Time 8   Period Weeks   Status Achieved  90% legibility for 1/2 page with fatigue   OT LONG TERM GOAL #5   Title Improve overall typing to 35 wpm or > with 90% accuracy    Time 8   Period Weeks   Status Partially Met  39 wpm, 87% accuracy               Plan - 02/06/16 0932    Clinical Impression Statement Pt met most LTG's and partially met remaining LTG's. Pt has improved grip strength and typing skills   Plan D/C O.T. next session   OT Home Exercise Plan 12/13/15: coordination and putty HEP, Rt handed typing words. 12/27/15: Theraband HEP    Consulted and Agree with Plan of Care Patient        Problem List Patient Active Problem List   Diagnosis Date Noted  . Weakness due to cerebrovascular accident 12/27/2015  . Stroke (cerebrum) ([place]) 11/24/2015  . 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 HLD (hyperlipidemia) 04/30/2009  . OTHER MALAISE AND FATIGUE 04/25/2009  . Essential hypertension 03/15/2009  . Allergic rhinitis 03/15/2009  . URINARY INCONTINENCE 03/15/2009    Carey Bullocks, OTR/L 02/06/2016, 9:34 AM  [facility] [address] [place], [place], [address] Phone: [phone]   Fax:  [phone]  Name: Veronica Andrade MRN: [id number] Date of Birth: 06-10-1951

## 2016-02-07 NOTE — Telephone Encounter (Signed)
Called patient and left a VM of papers and fee.

## 2016-02-08 ENCOUNTER — Telehealth: Payer: Self-pay | Admitting: *Deleted

## 2016-02-08 NOTE — Telephone Encounter (Signed)
Spoke with patient and informed her she can pay the fee when she is here for her next apt. 02/13/16.

## 2016-02-08 NOTE — Telephone Encounter (Signed)
Patient [employer] form on [employer] desk.

## 2016-02-09 ENCOUNTER — Encounter: Payer: Self-pay | Admitting: Occupational Therapy

## 2016-02-09 ENCOUNTER — Ambulatory Visit: Payer: 59 | Admitting: Occupational Therapy

## 2016-02-09 DIAGNOSIS — IMO0002 Reserved for concepts with insufficient information to code with codable children: Secondary | ICD-10-CM

## 2016-02-09 DIAGNOSIS — I69351 Hemiplegia and hemiparesis following cerebral infarction affecting right dominant side: Secondary | ICD-10-CM | POA: Diagnosis not present

## 2016-02-09 DIAGNOSIS — R279 Unspecified lack of coordination: Secondary | ICD-10-CM | POA: Diagnosis not present

## 2016-02-09 DIAGNOSIS — I6980 Unspecified sequelae of other cerebrovascular disease: Secondary | ICD-10-CM | POA: Diagnosis not present

## 2016-02-09 DIAGNOSIS — R4189 Other symptoms and signs involving cognitive functions and awareness: Secondary | ICD-10-CM | POA: Diagnosis not present

## 2016-02-09 NOTE — Therapy (Signed)
[facility] [address] 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 Hypertension   . Hyperlipidemia   . Stroke [facility])     Past Surgical History  Procedure Laterality Date  . Knee arthroscopy Left   . Tubal ligation    . Bladder surgery      Bladder Tack    There were no vitals filed for this visit.  Visit Diagnosis:  Lack of coordination due to stroke  Hemiplegia and hemiparesis following cerebral infarction affecting right dominant side [facility])      Subjective Assessment - 02/09/16 0849    Subjective  I feel pretty good about d/c   Patient Stated Goals I want to go back to work, the neurologist hasn't released me   Currently in Pain? No/denies                      OT Treatments/Exercises (OP) - 02/09/16 0001    Fine Motor Coordination   Small Pegboard Placing small pegs in pegboard manipulating 5 pegs at a time for in hand manipulation with min drops Rt hand.    Other Fine Motor Exercises Pt using scissors to cut straight line, zigzag line, and curved lines for coordination and control Rt hand with min difficulty   Other Fine Motor Exercises "braiding" string for coordination and bilateral integration with mod difficulty (novice task). Pt screwing/unscrewing nuts/bolts with Rt hand (Lt as stabalizer) with eyes occluded and min difficulty                   OT Short Term Goals - 02/06/16 0907    OT SHORT TERM GOAL #1   Title Pt independent with HEP (due 01/10/16)   Time 4   Period Weeks   Status Achieved   OT SHORT TERM GOAL #2   Title Pt to eat 90% with Rt dominant hand   Baseline 65%   Time 4   Period Weeks   Status Achieved   OT SHORT TERM GOAL #3   Title Pt to apply makeup 50% of time with Rt dominant hand   Baseline all with Lt hand   Time 4   Period Weeks   Status Achieved   OT SHORT TERM GOAL #4   Title Improve coordination as evidenced by performing 9 hole peg test in 28 sec. or less Rt hand   Baseline 38.43 sec. (Lt = 20.12 sec)   Time 4   Period Weeks   Status Achieved  01/04/16: Rt = 25.59 sec.    OT SHORT TERM GOAL #5   Title Improve grip strength to 58 lbs or greater   Baseline 52 lbs (Lt = 62 lbs)    Time 4   Period Weeks   Status Achieved  01/04/16: Rt = 50 lbs, 02/06/16 Rt = 60  lbs   OT SHORT TERM GOAL #6   Title Improve overall typing to 28 wpm with 80% accuracy or greater   Baseline 14-23 wpm, 24-66% accuracy   Time 4   Period Weeks   Status Achieved  )   OT SHORT TERM GOAL #7   Title Pt to verbalize understanding with sensory impairments Rt hand and safety precautions needed   Time 4   Period Weeks   Status Achieved           OT Long Term Goals - 02/06/16 0900    OT LONG TERM GOAL #1   Title Independent with updated HEP (DUE 02/07/16)    Time 8   Period Weeks   Status Achieved   OT LONG TERM GOAL #2   Title Pt to perform all ADLS with Rt hand as dominant hand 90% of the time   Time 8   Period Weeks   Status Achieved  01/29/16 per pt report   OT LONG TERM GOAL #3   Title Pt to return to cooking at Mod I level    Time 8   Period Weeks   Status Partially Met  met with supervision, except getting items out of oven for safety reasons d/t altered sensation   OT LONG TERM GOAL #4   Title Pt to write 1/2 page text in reasonable amt of time maintaining 90% legibility    Time 8   Period Weeks   Status Achieved  90% legibility for 1/2 page with fatigue   OT LONG TERM GOAL #5   Title Improve overall typing to 35 wpm or > with 90% accuracy    Time 8   Period Weeks   Status Partially Met  39 wpm, 87% accuracy               Plan - 02/09/16 8338    Clinical Impression Statement Pt met all STG's and met 3/5 LTG's and partially met remaining goals. Pt has improved signifcantly with coordination   Plan D/C O.T. at this time   OT Home Exercise Plan 12/13/15: coordination and putty HEP, Rt handed typing words. 12/27/15: Theraband HEP    Consulted and Agree with Plan of Care Patient        Problem List Patient Active Problem List   Diagnosis Date Noted  . Weakness due to cerebrovascular accident 12/27/2015  . Stroke (cerebrum) ([place]) 11/24/2015  . 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 HLD (hyperlipidemia) 04/30/2009  . OTHER MALAISE AND FATIGUE 04/25/2009  . Essential hypertension 03/15/2009  . Allergic rhinitis 03/15/2009  . URINARY INCONTINENCE 03/15/2009     OCCUPATIONAL THERAPY DISCHARGE SUMMARY  Visits from Start of Care: 16  Current functional level related to goals / functional outcomes: See above   Remaining deficits: Mild coordination deficits Sensory impairments   Education / Equipment: HEP's, desensitization techniques  Plan: Patient agrees to discharge.  Patient goals were met. Patient is being discharged due to meeting the stated rehab goals.  ?????       Carey Bullocks, OTR/L 02/09/2016, 9:44 AM  [facility] [address] [place], [place], [address] Phone: [phone]   Fax:  [phone]  Name: Veronica Andrade MRN: [id number] Date of Birth: Apr 25, 1951

## 2016-02-13 ENCOUNTER — Encounter: Payer: Self-pay | Admitting: Neurology

## 2016-02-13 ENCOUNTER — Ambulatory Visit (INDEPENDENT_AMBULATORY_CARE_PROVIDER_SITE_OTHER): Payer: 59 | Admitting: Neurology

## 2016-02-13 VITALS — BP 176/100 | HR 82 | Ht 67.0 in | Wt 181.2 lb

## 2016-02-13 DIAGNOSIS — I10 Essential (primary) hypertension: Secondary | ICD-10-CM | POA: Diagnosis not present

## 2016-02-13 DIAGNOSIS — E785 Hyperlipidemia, unspecified: Secondary | ICD-10-CM

## 2016-02-13 DIAGNOSIS — I63332 Cerebral infarction due to thrombosis of left posterior cerebral artery: Secondary | ICD-10-CM | POA: Diagnosis not present

## 2016-02-13 MED ORDER — GABAPENTIN 100 MG PO CAPS: 100.0000 mg | ORAL_CAPSULE | Freq: Three times a day (TID) | ORAL | Status: DC

## 2016-02-13 MED FILL — GABAPENTIN 100 MG CAPSULE: 100 | 30 days supply | Qty: 90 | Fill #0

## 2016-02-13 NOTE — Patient Instructions (Addendum)
-   continue ASA and lipitor for stroke prevention - will add gabapentin for right sided numbness and heaviness - check BP at home and record and bring over to PCP for medication adjustment if needed. - Follow up with your primary care physician for stroke risk factor modification. Recommend maintain blood pressure goal <130/80, diabetes with hemoglobin A1c goal below 6.5% and lipids with LDL cholesterol goal below 70 mg/dL.  - relaxation and cope with stress or depressed mood. If you need medication for help, please let us know. - avoid over exertion - follow up in 2 months.

## 2016-02-14 NOTE — Progress Notes (Signed)
STROKE NEUROLOGY FOLLOW UP NOTE  NAME: Veronica Andrade DOB: 01-Nov-1951  REASON FOR VISIT: stroke follow up HISTORY FROM: pt and chart  Today we had the pleasure of seeing Veronica Andrade in follow-up at our Neurology Clinic. Pt was accompanied by no one.   History Summary Veronica Andrade is a 65 y.o. female with history a history of hypertension was admitted on 11/24/15 for sudden onset of right-sided numbness and mild right arm weakness. She did not receive IV t-PA due to mild deficits. MRI showed acute left thalamic infarct, likely small vessel disease. CTA head and neck unremarkable, EF 65-70%. LDL 150 and A1C 6.6. She was discharged on ASA and lipitor 40.   Interval History During the interval time, the patient has been doing OK. Continued to have right sided numbness, tingling and mild pain. Felt heaviness all the time with right arm, not able to write well and not able to type well. Difficulty using computer. Continued exercise and had high hope to get better soon. However, fluctuating mood with hope and depression. Followed up with Darrol Angel NP on 12/27/15, no recurrent stroke symptoms and continued ASA and lipitor. Pt came in today with continued symptoms. BP 176/100 but she stated her BP is always 130/80 at home. On low dose lisinopril.    REVIEW OF SYSTEMS: Full 14 system review of systems performed and notable only for those listed below and in HPI above, all others are negative:  Constitutional:   Cardiovascular:  Ear/Nose/Throat:   Skin:  Eyes:   Respiratory:   Gastroitestinal:   Genitourinary: incontinence of bladder Hematology/Lymphatic:   Endocrine: flushing Musculoskeletal:   Allergy/Immunology:   Neurological:  Numbness and weakness Psychiatric:  Sleep: frequent waking , daytime sleepiness  The following represents the patient's updated allergies and side effects list: Allergies  Allergen Reactions  . Codeine Nausea Only and Other (See Comments)   Headaches also  . Morphine Nausea Only and Other (See Comments)    Headaches also    The neurologically relevant items on the patient's problem list were reviewed on today's visit.  Neurologic Examination  A problem focused neurological exam (12 or more points of the single system neurologic examination, vital signs counts as 1 point, cranial nerves count for 8 points) was performed.  Blood pressure 176/100, pulse 82, height  (1.702 m), weight 181 lb 3.2 oz (82.192 kg).  General - Well nourished, well developed, in no apparent distress.  Ophthalmologic - Sharp disc margins OU.   Cardiovascular - Regular rate and rhythm.  Mental Status -  Level of arousal and orientation to time, place, and person were intact. Language including expression, naming, repetition, comprehension was assessed and found intact. Fund of Knowledge was assessed and was intact.  Cranial Nerves II - XII - II - Visual field intact OU. III, IV, VI - Extraocular movements intact. V - Facial sensation decreased on the right, 50% of left. VII - Facial movement intact bilaterally. VIII - Hearing & vestibular intact bilaterally. X - Palate elevates symmetrically. XI - Chin turning & shoulder shrug intact bilaterally. XII - Tongue protrusion intact.  Motor Strength - The patient's strength was normal in all extremities except mild give away weakness on the right UE and pronator drift was absent.  Bulk was normal and fasciculations were absent.   Motor Tone - Muscle tone was assessed at the neck and appendages and was normal.  Reflexes - The patient's reflexes were 1+ in all extremities and  she had no pathological reflexes.  Sensory - Light touch, temperature/pinprick were assessed and were decreased on the right, 25% of the left.    Coordination - The patient had normal movements in the hands and feet with no ataxia or dysmetria, but slow on the right.  Tremor was absent.  Gait and Station - mild  psychogenic gait on the right.  Data reviewed: I personally reviewed the images and agree with the radiology interpretations.  Ct Head Wo Contrast 11/24/2015  No acute intracranial abnormalities. The appearance of the brain is normal.   MRI brain without contrast 11/25/2015 Acute LEFT posterolateral thalamic nonhemorrhagic infarct. Mild chronic microvascular ischemic change.  CTA Head and Neck  11/26/2015  CTA Neck Minimal plaque carotid bifurcation bilaterally without significant narrowing. Minimal plaque origin right vertebral artery without significant narrowing. Codominant vertebral arteries.  CTA Head Anterior circulation without medium or large size vessel significant stenosis or occlusion. Minimal calcified plaque right internal carotid artery cavernous segment. Mild to moderate narrowing P1 and P2 segment posterior cerebral arteries bilaterally.  CT Head Acute nonhemorrhagic left thalamic/posterior limb left internal capsule infarct.  TTE - Left ventricle: The cavity size was normal. There was moderate concentric hypertrophy. Systolic function was vigorous. The estimated ejection fraction was in the range of 65% to 70%. Wall motion was normal; there were no regional wall motion abnormalities. Doppler parameters are consistent with abnormal left ventricular relaxation (grade 1 diastolic dysfunction). There was no evidence of elevated ventricular filling pressure by Doppler parameters. - Aortic valve: Trileaflet; normal thickness leaflets. - Aortic root: The aortic root was normal in size. - Mitral valve: Structurally normal valve. - Right ventricle: The cavity size was normal. Wall thickness was normal. Systolic function was normal. - Right atrium: The atrium was normal in size. - Tricuspid valve: There was trivial regurgitation. - Pulmonic valve: There was no regurgitation. - Pulmonary arteries: Systolic pressure was within the  normal range. - Inferior vena cava: The vessel was normal in size. - Pericardium, extracardiac: There was no pericardial effusion.  Component     Latest Ref Rng 11/25/2015  Cholesterol     0 - 200 mg/dL 161211 (H)  Triglycerides     <150 mg/dL 096119  HDL Cholesterol     >40 mg/dL 37 (L)  Total CHOL/HDL Ratio      5.7  VLDL     0 - 40 mg/dL 24  LDL (calc)     0 - 99 mg/dL 045150 (H)  Hemoglobin [id number]     4.8 - 5.6 % 6.6 (H)  Mean Plasma Glucose      143    Assessment: As you may recall, she is a 65 y.o. Caucasian female with PMH of hypertension was admitted on 11/24/15 for acute left thalamic infarct, likely small vessel disease. CTA head and neck unremarkable, EF 65-70%. LDL 150 and A1C 6.6. She was discharged on ASA and lipitor 40. During the interval time, the patient had fluctuating mood with hope and depression. Followed up with Darrol Angelarolyn Martin NP on 12/27/15, no recurrent stroke symptoms and continued ASA and lipitor. Due to right sided numbness and burning sensation, will add gabapentin.  Plan:  - continue ASA and lipitor for stroke prevention - add gabapentin for right sided numbness and heaviness - check BP at home and record and bring over to PCP for medication adjustment if needed. - Follow up with your primary care physician for stroke risk factor modification. Recommend maintain blood pressure goal <130/80, diabetes with  hemoglobin A1c goal below 6.5% and lipids with LDL cholesterol goal below 70 mg/dL.  - relaxation and cope with stress or depressed mood. If you need medication for help, please let us know. - avoid over exertion - follow up in 2 months.   I spent more than 25 minutes of face to face time with the patient. Greater than 50% of time was spent in counseling and coordination of care. We discussed about new medication for numbness, depression management.   No orders of the defined types were placed in this encounter.    Meds ordered this encounter  Medications   . gabapentin (NEURONTIN) 100 MG capsule    Sig: Take 1 capsule (100 mg total) by mouth 3 (three) times daily.    Dispense:  90 capsule    Refill:  5    Patient Instructions  - continue ASA and lipitor for stroke prevention - will add gabapentin for right sided numbness and heaviness - check BP at home and record and bring over to PCP for medication adjustment if needed. - Follow up with your primary care physician for stroke risk factor modification. Recommend maintain blood pressure goal <130/80, diabetes with hemoglobin A1c goal below 6.5% and lipids with LDL cholesterol goal below 70 mg/dL.  - relaxation and cope with stress or depressed mood. If you need medication for help, please let us know. - avoid over exertion - follow up in 2 months.    Marvel Plan, MD PhD [facility] [address] [place], [place] [address] [phone]

## 2016-02-21 ENCOUNTER — Telehealth: Payer: Self-pay

## 2016-02-21 NOTE — Telephone Encounter (Signed)
Rn call patient to let her know that the [employer] disability form was ready. Pt will come today and pay 25.00 dollars. Copy of disability form will be given to patient.

## 2016-02-21 NOTE — Telephone Encounter (Signed)
Gave [employer] disability form to patient. Copy sent to Medical records, [facility]

## 2016-02-22 DIAGNOSIS — Z0289 Encounter for other administrative examinations: Secondary | ICD-10-CM

## 2016-02-28 ENCOUNTER — Encounter: Payer: Self-pay | Admitting: Primary Care

## 2016-02-28 ENCOUNTER — Telehealth: Payer: Self-pay | Admitting: Primary Care

## 2016-02-28 ENCOUNTER — Ambulatory Visit (INDEPENDENT_AMBULATORY_CARE_PROVIDER_SITE_OTHER): Payer: 59 | Admitting: Primary Care

## 2016-02-28 VITALS — BP 162/100 | HR 76 | Temp 98.1°F | Ht 67.0 in | Wt 181.0 lb

## 2016-02-28 DIAGNOSIS — I63332 Cerebral infarction due to thrombosis of left posterior cerebral artery: Secondary | ICD-10-CM

## 2016-02-28 DIAGNOSIS — I10 Essential (primary) hypertension: Secondary | ICD-10-CM

## 2016-02-28 DIAGNOSIS — R739 Hyperglycemia, unspecified: Secondary | ICD-10-CM | POA: Diagnosis not present

## 2016-02-28 DIAGNOSIS — E785 Hyperlipidemia, unspecified: Secondary | ICD-10-CM | POA: Diagnosis not present

## 2016-02-28 MED ORDER — HYDROCHLOROTHIAZIDE 12.5 MG PO CAPS: 12.5000 mg | ORAL_CAPSULE | Freq: Every day | ORAL | Status: DC

## 2016-02-28 MED ORDER — LISINOPRIL 5 MG PO TABS: 5.0000 mg | ORAL_TABLET | Freq: Two times a day (BID) | ORAL | Status: DC

## 2016-02-28 MED FILL — HYDROCHLOROTHIAZIDE 12.5 MG: 12.5 | 30 days supply | Qty: 30 | Fill #0

## 2016-02-28 NOTE — Telephone Encounter (Signed)
Pt called to confirm 5 mg lisinopril twice a day

## 2016-02-28 NOTE — Patient Instructions (Addendum)
Please either cal or e-mail me when you get home to notify me of the strength of medication you're taking.  My records indicate Lisinopril 5 mg and that you are taking 2 tablets by mouth everyday.   Start Hydrochlorothiazide 12.5 mg tablets for high blood pressure. Take 1 tablet by mouth every morning.   Schedule a lab only appointment tomorrow for re-check of your cholesterol and sugar levels. Ensure you are fasting. You may have water and black coffee only.  Follow up in 2 weeks for re-check of your blood pressure.   It was a pleasure to see you today!

## 2016-02-28 NOTE — Telephone Encounter (Signed)
Is she taking 1 tablet by mouth twice daily or 2 tablets by mouth twice daily?

## 2016-02-28 NOTE — Progress Notes (Signed)
Pre visit review using our clinic review tool, if applicable. No additional management support is needed unless otherwise documented below in the visit note. 

## 2016-02-28 NOTE — Progress Notes (Signed)
Subjective:    Patient ID: Veronica Andrade, female    DOB: [birth year], 65 y.o.   MRN: [id number]  HPI  Veronica Andrade is a 65 year old female who presents today for follow up.   1) Hyperlipidemia: Currently managed on Atorvastatin 40 mg that was initiated during hospitalization for recent CVA. She is due today for re-check today but is not fasting. She will return tomorrow for labs. She's working to improve her diet and is walking 3-4 days weekly.   2) Essential Hypertension: Currently managed on Lisinopril 10 mg and is taking 2 tablets by mouth twice daily. She's been checking her BP at home and has been getting readings of 130's-140's/70's-80's. Her BP at the neurology office was 176/100 and 180/102 in the clinic today. Denies chest pain, headaches, dizziness.   3) CVA: Acute left thalamic infarct on 11/24/15. She's been compliant with therapy and her neurology appointments. She continues to feel numbness to the right side of her body. She is currently walking 2 miles daily 4-5 days weekly. She's been released by OT and PT. She followed with neurology last week. She was prescribed gabapentin for neuropathy to her right side but has not taken as of yet.   4) Type 2 Diabetes: A1C of 6.6 during hospitalization. Due for repeat testing today. She has been working on her diet and exercising. Denies polyuria. 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   Review of Systems  Constitutional: Negative for unexpected weight change.  Respiratory: Negative for shortness of breath.   Cardiovascular: Negative for chest pain.  Endocrine: Negative for polyuria.  Neurological: Positive for numbness. Negative for headaches.       Past Medical History  Diagnosis Date  . Hypertension   . Hyperlipidemia   . Stroke [facility])     Social History   Social History  . Marital Status: Married    Spouse Name: N/A  . Number of Children: N/A  . Years of Education: N/A   Occupational History  . Not on file.    Social History Main Topics  . Smoking status: Never Smoker   . Smokeless tobacco: Not on file  . Alcohol Use: No  . Drug Use: No  . Sexual Activity: Not on file   Other Topics Concern  . Not on file   Social History Narrative   Married.   2 children, 4 g.children   Works at [employer], Chief of Staff.     Enjoys spending time in [place], camping, spending time with family.    Past Surgical History  Procedure Laterality Date  . Knee arthroscopy Left   . Tubal ligation    . Bladder surgery      Bladder Tack    Family History  Problem Relation Age of Onset  . Stroke Father   . Diabetes Father   . Hypertension Father   . Arrhythmia Mother   . Hypertension Mother   . Hypertension Brother     Allergies  Allergen Reactions  . Codeine Nausea Only and Other (See Comments)    Headaches also  . Morphine Nausea Only and Other (See Comments)    Headaches also    Current Outpatient Prescriptions on File Prior to Visit  Medication Sig Dispense Refill  . aspirin 325 MG tablet Take 1 tablet (325 mg total) by mouth daily. 60 tablet 0  . atorvastatin (LIPITOR) 40 MG tablet Take 1 tablet (40 mg total) by mouth daily at 6 PM. 90 tablet 0  .  cholecalciferol (VITAMIN D) 1000 units tablet Take 1,000 Units by mouth daily.    Marland Kitchen. gabapentin (NEURONTIN) 100 MG capsule Take 1 capsule (100 mg total) by mouth 3 (three) times daily. 90 capsule 5  . lisinopril (PRINIVIL,ZESTRIL) 5 MG tablet Take 2 tablets (10 mg total) by mouth daily. 180 tablet 0  . Multiple Vitamins-Minerals (MULTIVITAMIN GUMMIES ADULTS) CHEW Chew 2 each by mouth daily.    . ondansetron (ZOFRAN ODT) 4 MG disintegrating tablet Take 1 tablet (4 mg total) by mouth every 8 (eight) hours as needed for nausea. (Patient not taking: Reported on 02/28/2016) 30 tablet 0   No current facility-administered medications on file prior to visit.    BP 162/100 mmHg  Pulse 76  Temp(Src) 98.1 F (36.7 C) (Oral)  Ht 5\' 7"  (1.702 m)  Wt 181 lb  (82.101 kg)  BMI 28.34 kg/m2  SpO2 99%    Objective:   Physical Exam  Constitutional: She appears well-nourished.  Eyes: EOM are normal. Pupils are equal, round, and reactive to light.  Cardiovascular: Normal rate and regular rhythm.   Pulmonary/Chest: Effort normal and breath sounds normal.  Neurological: No cranial nerve deficit.  Skin: Skin is warm and dry.          Assessment & Plan:

## 2016-02-28 NOTE — Assessment & Plan Note (Signed)
Overall recovering well. Exam mostly unremarkable today, does have residual numbness to right side. Will get BP under control with close monitoring.

## 2016-02-28 NOTE — Telephone Encounter (Signed)
Closed in error.

## 2016-02-28 NOTE — Assessment & Plan Note (Signed)
Elevated in clinic today, elevated at neurologists office on recent visit. Suspect she may have a touch of white coat syndrome, however, home readings are above goal given her recent CVA. She's unsure of what she's currently taking at home as she thinks she's taking 10 mg BID of Lisinopril. Will have her call me back after reading her bottle at home as her instructions do not match ours on file. Start HCTZ 12.5 mg once daily. Follow up in 2 weeks for re-evaluation of BP and BMP.

## 2016-02-28 NOTE — Assessment & Plan Note (Signed)
Managed on statin, due for repeat lipids today but did not come fasting. She is to return tomorrow.

## 2016-02-28 NOTE — Telephone Encounter (Signed)
Called and spoken to patient. She stated that she is taking 1 tablet by mouth twice a day.

## 2016-02-29 ENCOUNTER — Other Ambulatory Visit (INDEPENDENT_AMBULATORY_CARE_PROVIDER_SITE_OTHER): Payer: 59

## 2016-02-29 DIAGNOSIS — E785 Hyperlipidemia, unspecified: Secondary | ICD-10-CM | POA: Diagnosis not present

## 2016-02-29 DIAGNOSIS — R739 Hyperglycemia, unspecified: Secondary | ICD-10-CM

## 2016-02-29 LAB — LIPID PANEL
CHOL/HDL RATIO: 3
Cholesterol: 126 mg/dL (ref 0–200)
HDL: 36.5 mg/dL — ABNORMAL LOW (ref >39.00)
LDL Cholesterol: 70 mg/dL (ref 0–99)
NonHDL: 89.65
TRIGLYCERIDES: 96 mg/dL (ref 0.0–149.0)
VLDL: 19.2 mg/dL (ref 0.0–40.0)

## 2016-02-29 LAB — HEMOGLOBIN A1C: Hgb A1c MFr Bld: 6.3 % (ref 4.6–6.5)

## 2016-03-13 ENCOUNTER — Ambulatory Visit: Payer: 59 | Admitting: Primary Care

## 2016-03-20 ENCOUNTER — Ambulatory Visit: Payer: 59 | Admitting: Primary Care

## 2016-03-20 DIAGNOSIS — H5203 Hypermetropia, bilateral: Secondary | ICD-10-CM | POA: Diagnosis not present

## 2016-03-20 DIAGNOSIS — H524 Presbyopia: Secondary | ICD-10-CM | POA: Diagnosis not present

## 2016-03-20 DIAGNOSIS — H52223 Regular astigmatism, bilateral: Secondary | ICD-10-CM | POA: Diagnosis not present

## 2016-03-21 ENCOUNTER — Ambulatory Visit (INDEPENDENT_AMBULATORY_CARE_PROVIDER_SITE_OTHER): Payer: 59 | Admitting: Primary Care

## 2016-03-21 ENCOUNTER — Encounter: Payer: Self-pay | Admitting: Primary Care

## 2016-03-21 VITALS — BP 154/98 | HR 66 | Temp 97.6°F | Ht 67.0 in | Wt 178.8 lb

## 2016-03-21 DIAGNOSIS — R7303 Prediabetes: Secondary | ICD-10-CM

## 2016-03-21 DIAGNOSIS — I10 Essential (primary) hypertension: Secondary | ICD-10-CM | POA: Diagnosis not present

## 2016-03-21 NOTE — Assessment & Plan Note (Signed)
Checking BP once daily everyday. Home readings in the 115-120/70*80's range. No readings above 135/90. Nausea and vomiting with HCTZ, she's not had in several days. Discontinue.  Suspect "white coat syndrome" and will continue lisinopril 5 mg BID. Continue efforts towards weight loss. Asymptomatic.

## 2016-03-21 NOTE — Progress Notes (Signed)
Pre visit review using our clinic review tool, if applicable. No additional management support is needed unless otherwise documented below in the visit note. 

## 2016-03-21 NOTE — Progress Notes (Signed)
Subjective:    Patient ID: Veronica Andrade, female    DOB: 06/19/1951, 65 y.o.   MRN: [id number]  HPI  Veronica Andrade is a 65 year old female who presents today for re-evaluation of hypertension. Recent history of CVA. Elevated blood pressure readings despite treatment with Lisinopril 5 mg BID. She initially thought she was taking 10 mg BID, but after looking at her bottle she noted 5 mg BID. Last visit she was initiated on HCTZ 12.5 mg.   Since her last visit her blood pressure has been normal at home. She is getting readings of 115-120's/70-80's. She was evaluated by her optometrist yesterday with an initial reading of 150/90, with 130/80 recheck towards the end of her appointment. She could feel herself tense up prior to eye appointment. Her BP is elevated today in the clinic. She stopped taking her HCTZ on 04/19 as she felt symptoms of nausea and vomiting.   She's been walking 3 miles 4-5 days weekly and has lost 3 pounds.  Denies chest pain, nausea, vomiting, dizziness.   BP Readings from Last 3 Encounters:  03/21/16 160/100  02/28/16 162/100  02/13/16 176/100    Review of Systems  Eyes: Negative for visual disturbance.  Respiratory: Negative for shortness of breath.   Cardiovascular: Negative for chest pain.  Gastrointestinal: Negative for nausea and vomiting.  Neurological: Negative for dizziness and headaches.       Past Medical History  Diagnosis Date  . Hypertension   . Hyperlipidemia   . Stroke [facility](HCC)      Social History   Social History  . Marital Status: Married    Spouse Name: N/A  . Number of Children: N/A  . Years of Education: N/A   Occupational History  . Not on file.   Social History Main Topics  . Smoking status: Never Smoker   . Smokeless tobacco: Not on file  . Alcohol Use: No  . Drug Use: No  . Sexual Activity: Not on file   Other Topics Concern  . Not on file   Social History Narrative   Married.   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     [place], [place], spending time with family.    Past Surgical History  Procedure Laterality Date  . Knee arthroscopy Left   . Tubal ligation    . Bladder surgery      Bladder Tack    Family History  Problem Relation Age of Onset  . Stroke Father   . Diabetes Father   . Hypertension Father   . Arrhythmia Mother   . Hypertension Mother   . Hypertension Brother     Allergies  Allergen Reactions  . Codeine Nausea Only and Other (See Comments)    Headaches also  . Morphine Nausea Only and Other (See Comments)    Headaches also  . Hydrochlorothiazide Nausea And Vomiting    Current Outpatient Prescriptions on File Prior to Visit  Medication Sig Dispense Refill  . aspirin 325 MG tablet Take 1 tablet (325 mg total) by mouth daily. 60 tablet 0  . atorvastatin (LIPITOR) 40 MG tablet Take 1 tablet (40 mg total) by mouth daily at 6 PM. 90 tablet 0  . cholecalciferol (VITAMIN D) 1000 units tablet Take 1,000 Units by mouth daily.    Marland Kitchen. gabapentin (NEURONTIN) 100 MG capsule Take 1 capsule (100 mg total) by mouth 3 (three) times daily. 90 capsule 5  .  lisinopril (PRINIVIL,ZESTRIL) 5 MG tablet Take 1 tablet (5 mg total) by mouth 2 (two) times daily. 180 tablet 0  . Multiple Vitamins-Minerals (MULTIVITAMIN GUMMIES ADULTS) CHEW Chew 2 each by mouth daily.    . ondansetron (ZOFRAN ODT) 4 MG disintegrating tablet Take 1 tablet (4 mg total) by mouth every 8 (eight) hours as needed for nausea. (Patient not taking: Reported on 03/21/2016) 30 tablet 0   No current facility-administered medications on file prior to visit.    BP 160/100 mmHg  Pulse 66  Temp(Src) 97.6 F (36.4 C) (Oral)  Ht  (1.702 m)  Wt 178 lb 12.8 oz (81.103 kg)  BMI 28.00 kg/m2  SpO2 97%    Objective:   Physical Exam  Constitutional: She appears well-nourished.  Cardiovascular: Normal rate and regular rhythm.   Pulmonary/Chest: Effort normal and breath sounds  normal.  Skin: Skin is warm and dry.  Psychiatric: She has a normal mood and affect.          Assessment & Plan:

## 2016-03-21 NOTE — Patient Instructions (Addendum)
Your home blood pressure is stable.   Continue Lisinopril 5 mg twice daily for blood pressure.  Continue to check your blood pressure and notify me if you get readings above 135/90, or if you develop headaches, weakness, numbness/tingling.  Continue your efforts towards a healthy diet and exercise. Congratulations on your weight loss!  Schedule a lab only appointment in 3 months for recheck of your sugars.  Follow up in 6 months.  It was a pleasure to see you today!

## 2016-03-26 ENCOUNTER — Ambulatory Visit: Payer: 59 | Admitting: Nurse Practitioner

## 2016-03-29 ENCOUNTER — Other Ambulatory Visit: Payer: Self-pay | Admitting: Primary Care

## 2016-03-29 DIAGNOSIS — I10 Essential (primary) hypertension: Secondary | ICD-10-CM

## 2016-03-29 MED ORDER — LISINOPRIL 5 MG PO TABS: 5.0000 mg | ORAL_TABLET | Freq: Two times a day (BID) | ORAL | Status: DC

## 2016-03-29 MED FILL — LISINOPRIL 5 MG TABLET: 5 | 90 days supply | Qty: 180 | Fill #0

## 2016-04-23 ENCOUNTER — Encounter: Payer: Self-pay | Admitting: Neurology

## 2016-04-23 ENCOUNTER — Ambulatory Visit (INDEPENDENT_AMBULATORY_CARE_PROVIDER_SITE_OTHER): Payer: 59 | Admitting: Neurology

## 2016-04-23 ENCOUNTER — Other Ambulatory Visit: Payer: Self-pay | Admitting: Primary Care

## 2016-04-23 VITALS — BP 166/92 | HR 75 | Ht 67.0 in | Wt 172.8 lb

## 2016-04-23 DIAGNOSIS — I63332 Cerebral infarction due to thrombosis of left posterior cerebral artery: Secondary | ICD-10-CM | POA: Insufficient documentation

## 2016-04-23 DIAGNOSIS — E785 Hyperlipidemia, unspecified: Secondary | ICD-10-CM | POA: Diagnosis not present

## 2016-04-23 DIAGNOSIS — I10 Essential (primary) hypertension: Secondary | ICD-10-CM | POA: Diagnosis not present

## 2016-04-23 HISTORY — DX: Cerebral infarction due to thrombosis of left posterior cerebral artery: I63.332

## 2016-04-23 MED ORDER — GABAPENTIN 100 MG PO CAPS
100.0000 mg | ORAL_CAPSULE | Freq: Every day | ORAL | Status: DC
Start: 2016-04-23 — End: 2017-03-27

## 2016-04-23 MED FILL — ATORVASTATIN 40 MG TABLET: 40 | 90 days supply | Qty: 90 | Fill #0

## 2016-04-23 NOTE — Patient Instructions (Signed)
-   continue ASA and lipitor for stroke prevention - continue gabapentin for right sided numbness and heaviness. May increase to twice a day if symptoms still bother much. - check BP at home and record  - Follow up with your primary care physician for stroke risk factor modification. Recommend maintain blood pressure goal <130/80, diabetes with hemoglobin A1c goal below 6.5% and lipids with LDL cholesterol goal below 70 mg/dL.  - healthy diet and continue home exercise - follow up in 6 months.

## 2016-04-23 NOTE — Progress Notes (Signed)
STROKE NEUROLOGY FOLLOW UP NOTE  NAME: Aubryanna Nesheim Glowacki DOB: 11-08-1951  REASON FOR VISIT: stroke follow up HISTORY FROM: pt and chart  Today we had the pleasure of seeing Malaisha E Vandruff in follow-up at our Neurology Clinic. Pt was accompanied by no one.   History Summary Ms. NOLIA TSCHANTZ is a 65 y.o. female with history a history of hypertension was admitted on 11/24/15 for sudden onset of right-sided numbness and mild right arm weakness. She did not receive IV t-PA due to mild deficits. MRI showed acute left thalamic infarct, likely small vessel disease. CTA head and neck unremarkable, EF 65-70%. LDL 150 and A1C 6.6. She was discharged on ASA and lipitor 40.   Follow up 02/13/16 - the patient has been doing OK. Continued to have right sided numbness, tingling and mild pain. Felt heaviness all the time with right arm, not able to write well and not able to type well. Difficulty using computer. Continued exercise and had high hope to get better soon. However, fluctuating mood with hope and depression. Followed up with Darrol Angel NP on 12/27/15, no recurrent stroke symptoms and continued ASA and lipitor. Pt came in today with continued symptoms. BP 176/100 but she stated her BP is always 130/80 at home. On low dose lisinopril.    Interval History During the interval time, pt has been doing well. Still has right sided numbness and tingling, taking gabapentin at night only. Helps for sleep. She is active in home exercise and walks 3 miles 4 times a week. BP controlled well at home. Finished PT/OT at the end of March.   REVIEW OF SYSTEMS: Full 14 system review of systems performed and notable only for those listed below and in HPI above, all others are negative:  Constitutional:  Fatigue, excessive sweating Cardiovascular:  Ear/Nose/Throat:  Runny nose Skin:  Eyes:  Eye discharges, light sensitivity Respiratory:   Gastroitestinal:  constipation Genitourinary: frequency of  urination Hematology/Lymphatic:   Endocrine:  Musculoskeletal:  Neck pain, neck stiffness Allergy/Immunology:   Neurological:  Numbness and weakness Psychiatric:  Sleep:   The following represents the patient's updated allergies and side effects list: Allergies  Allergen Reactions  . Codeine Nausea Only and Other (See Comments)    Headaches also  . Morphine Nausea Only and Other (See Comments)    Headaches also  . Hydrochlorothiazide Nausea And Vomiting    The neurologically relevant items on the patient's problem list were reviewed on today's visit.  Neurologic Examination  A problem focused neurological exam (12 or more points of the single system neurologic examination, vital signs counts as 1 point, cranial nerves count for 8 points) was performed.  Blood pressure 166/92, pulse 75, height  (1.702 m), weight 172 lb 12.8 oz (78.382 kg).  General - Well nourished, well developed, in no apparent distress.  Ophthalmologic - Sharp disc margins OU.   Cardiovascular - Regular rate and rhythm.  Mental Status -  Level of arousal and orientation to time, place, and person were intact. Language including expression, naming, repetition, comprehension was assessed and found intact. Fund of Knowledge was assessed and was intact.  Cranial Nerves II - XII - II - Visual field intact OU. III, IV, VI - Extraocular movements intact. V - Facial sensation decreased on the right.Marland Kitchen VII - Facial movement intact bilaterally. VIII - Hearing & vestibular intact bilaterally. X - Palate elevates symmetrically. XI - Chin turning & shoulder shrug intact bilaterally. XII - Tongue protrusion intact.  Motor Strength - The patient's strength was normal in all extremities except mild dexterity difficulty on the right hand on exam but she has normal handwriting and pronator drift was absent.  Bulk was normal and fasciculations were absent.   Motor Tone - Muscle tone was assessed at the neck and  appendages and was normal.  Reflexes - The patient's reflexes were 1+ in all extremities and she had no pathological reflexes.  Sensory - Light touch, temperature/pinprick were assessed and were decreased on the right comparing with the left.    Coordination- The patient had normal movements in the hands and feet with no ataxia or dysmetria.  Tremor was absent.  Gait and Station - normal gait and station.  Data reviewed: I personally reviewed the images and agree with the radiology interpretations.  Ct Head Wo Contrast 11/24/2015  No acute intracranial abnormalities. The appearance of the brain is normal.   MRI brain without contrast 11/25/2015 Acute LEFT posterolateral thalamic nonhemorrhagic infarct. Mild chronic microvascular ischemic change.  CTA Head and Neck 11/26/2015 CTA Neck Minimal plaque carotid bifurcation bilaterally without significant narrowing. Minimal plaque origin right vertebral artery without significant narrowing. Codominant vertebral arteries. CTA Head Anterior circulation without medium or large size vessel significant stenosis or occlusion. Minimal calcified plaque right internal carotid artery cavernous segment. Mild to moderate narrowing P1 and P2 segment posterior cerebral arteries bilaterally. CT Head Acute nonhemorrhagic left thalamic/posterior limb left internal capsule infarct.  TTE - Left ventricle: The cavity size was normal. There was moderate concentric hypertrophy. Systolic function was vigorous. The estimated ejection fraction was in the range of 65% to 70%. Wall motion was normal; there were no regional wall motion abnormalities. Doppler parameters are consistent with abnormal left ventricular relaxation (grade 1 diastolic dysfunction). There was no evidence of elevated ventricular filling pressure by Doppler parameters. - Aortic valve: Trileaflet; normal thickness leaflets. - Aortic root: The aortic root was normal in  size. - Mitral valve: Structurally normal valve. - Right ventricle: The cavity size was normal. Wall thickness was normal. Systolic function was normal. - Right atrium: The atrium was normal in size. - Tricuspid valve: There was trivial regurgitation. - Pulmonic valve: There was no regurgitation. - Pulmonary arteries: Systolic pressure was within the normal range. - Inferior vena cava: The vessel was normal in size. - Pericardium, extracardiac: There was no pericardial effusion.  Component     Latest Ref Rng 11/25/2015  Cholesterol     0 - 200 mg/dL 161211 (H)  Triglycerides     <150 mg/dL 096119  HDL Cholesterol     >40 mg/dL 37 (L)  Total CHOL/HDL Ratio      5.7  VLDL     0 - 40 mg/dL 24  LDL (calc)     0 - 99 mg/dL 045150 (H)  Hemoglobin [id number]     4.8 - 5.6 % 6.6 (H)  Mean Plasma Glucose      143    Assessment: As you may recall, she is a 65 y.o. Caucasian female with PMH of hypertension was admitted on 11/24/15 for acute left thalamic infarct, likely small vessel disease. CTA head and neck unremarkable, EF 65-70%. LDL 150 and A1C 6.6. She was discharged on ASA and lipitor 40. During the interval time, the patient had fluctuating mood with hope and depression. Followed up with Darrol Angelarolyn Martin NP on 12/27/15, no recurrent stroke symptoms and continued ASA and lipitor. Due to right sided numbness and burning sensation, on gabapentin at night. During  the interval time, she has been doing well, still has right sided numbness and tingling.  Plan:  - continue ASA and lipitor for stroke prevention - continue gabapentin for right sided numbness and heaviness. May increase to twice a day if symptoms still bother much. - check BP at home and record  - Follow up with your primary care physician for stroke risk factor modification. Recommend maintain blood pressure goal <130/80, diabetes with hemoglobin A1c goal below 6.5% and lipids with LDL cholesterol goal below 70 mg/dL.  - healthy diet and  continue home exercise - follow up in 6 months.   I spent more than 25 minutes of face to face time with the patient. Greater than 50% of time was spent in counseling and coordination of care. We discussed about gabpention for numbness and risk factor modification and continue home exercise.   No orders of the defined types were placed in this encounter.    No orders of the defined types were placed in this encounter.    Patient Instructions  - continue ASA and lipitor for stroke prevention - continue gabapentin for right sided numbness and heaviness. May increase to twice a day if symptoms still bother much. - check BP at home and record  - Follow up with your primary care physician for stroke risk factor modification. Recommend maintain blood pressure goal <130/80, diabetes with hemoglobin A1c goal below 6.5% and lipids with LDL cholesterol goal below 70 mg/dL.  - healthy diet and continue home exercise - follow up in 6 months.     Marvel Plan, MD PhD [facility] [address], [address] [place], [place] [address] [phone]

## 2016-05-17 MED FILL — GABAPENTIN 100 MG CAPSULE: 100 | 30 days supply | Qty: 90 | Fill #1

## 2016-06-13 ENCOUNTER — Telehealth: Payer: Self-pay | Admitting: *Deleted

## 2016-06-13 NOTE — Telephone Encounter (Signed)
Pt [employer] form on Katrina desk. 

## 2016-06-18 ENCOUNTER — Telehealth: Payer: Self-pay | Admitting: *Deleted

## 2016-06-18 NOTE — Telephone Encounter (Signed)
The symptoms she stated on the phone was really different from what I saw her on 04/23/16. She needs to be seen for re-evaluation before the paper work to be filled. Thanks.   Marvel Plan, MD PhD Stroke Neurology 06/18/2016 6:47 PM

## 2016-06-18 NOTE — Telephone Encounter (Signed)
LVM on home phone requesting call back. Reached patient on mobile number. Per Dr Roda Shutters , inquired if she is retiring or going back to work. She stated "No, I am employed until the end of the year."  Then she stated she cannot go back to work. She is requesting [employer] disability papers be completed. She stated she completed PT in Feb and OT in March, but she continues to have right sided weakness and numbness "from my head to my feet. My right eye, face and throat are numb. I can't type or use a pen. I have to manually lift my right arm and leg." She stated she continues to have pain and is taking Gabapentin for that.  She is requesting [employer] disability papers be completed. Will forward note to Dr Roda Shutters.

## 2016-06-19 NOTE — Telephone Encounter (Signed)
Spoke with patient and informed her Dr Roda Shutters stated the symptoms she is now describing are different from when he saw her 04/23/16, therefore she needs a follow up appointment for re evaluation before disability papers can be completed. Patient repeated several times and was adamant that "these are the same symptoms I had then". She stated she walked for Dr Roda Shutters, and told him she walks 1 to 1  1/2 miles every day. She restated that she is "having the same symptoms" and stated she doesn't need to come for a follow up. She stated "Maybe I need to talk to him. He has me confused with someone else." She did not want to schedule a follow up but requested Dr Roda Shutters call her to discuss.  This RN stated she would route note to Dr Roda Shutters. Patient verbalized understanding, appreciation.

## 2016-06-19 NOTE — Telephone Encounter (Signed)
Discussed with pt over the phone. She stated that she basically the same as 04/23/16 the visit. She still feel right hand and face and leg numbness and heaviness feeling. Not able to use mouse, and difficulty with computer. Sensatory deficit makes her difficulty to feel the keyboard. With 10-29min every day checking her emails, she felt heavy with her right arm and she has to stop or using left arm to lift right arm.   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 She does not feel she can handle the job and her job will keep the position to her until end of this year. She will see me again in Nov.   She stated that we have done disability for her in April, the disability this time may just need the 04/23/16 notes. I told her that we are going to find out what they really need. I also asked her to increase gabapentin to 100mg  tid for her symptoms. Pt expressed understanding and appreciation.   Marvel Plan, MD PhD Stroke Neurology 06/19/2016 6:21 PM

## 2016-06-20 ENCOUNTER — Other Ambulatory Visit: Payer: 59

## 2016-07-01 NOTE — Telephone Encounter (Signed)
Gave completed/signed form back to medical records.

## 2016-07-02 NOTE — Telephone Encounter (Signed)
[employer] disability form fax and receive. Pt already paided for form previously. Pt is planning to retire at the end of the year. Next time forms are due she will have to pay the fee of 50.00.

## 2016-07-03 MED FILL — LISINOPRIL 5 MG TABLET: 5 | 90 days supply | Qty: 180 | Fill #1

## 2016-07-04 ENCOUNTER — Other Ambulatory Visit (INDEPENDENT_AMBULATORY_CARE_PROVIDER_SITE_OTHER): Payer: 59

## 2016-07-04 ENCOUNTER — Telehealth: Payer: Self-pay | Admitting: *Deleted

## 2016-07-04 DIAGNOSIS — R7303 Prediabetes: Secondary | ICD-10-CM | POA: Diagnosis not present

## 2016-07-04 LAB — HEMOGLOBIN A1C: HEMOGLOBIN A1C: 6.1 % (ref 4.6–6.5)

## 2016-07-04 NOTE — Telephone Encounter (Signed)
Pt [employer] form fax to [phone] on 07/04/2016

## 2016-07-11 DIAGNOSIS — Z0289 Encounter for other administrative examinations: Secondary | ICD-10-CM

## 2016-07-16 MED FILL — ATORVASTATIN 40 MG TABLET: 40 | 90 days supply | Qty: 90 | Fill #1

## 2016-07-16 MED FILL — GABAPENTIN 100 MG CAPSULE: 100 | 30 days supply | Qty: 90 | Fill #2

## 2016-09-11 ENCOUNTER — Other Ambulatory Visit: Payer: Self-pay | Admitting: Family Medicine

## 2016-09-11 DIAGNOSIS — Z1231 Encounter for screening mammogram for malignant neoplasm of breast: Secondary | ICD-10-CM

## 2016-09-16 MED FILL — GABAPENTIN 100 MG CAPSULE: 100 | 30 days supply | Qty: 90 | Fill #3

## 2016-09-20 ENCOUNTER — Ambulatory Visit: Payer: 59 | Admitting: Primary Care

## 2016-09-30 ENCOUNTER — Ambulatory Visit
Admission: RE | Admit: 2016-09-30 | Discharge: 2016-09-30 | Disposition: A | Discharge status: Home / Self Care | Payer: 59 | Source: Ambulatory Visit | Attending: Family Medicine | Admitting: Family Medicine

## 2016-09-30 DIAGNOSIS — Z1231 Encounter for screening mammogram for malignant neoplasm of breast: Secondary | ICD-10-CM | POA: Diagnosis not present

## 2016-10-03 ENCOUNTER — Ambulatory Visit (INDEPENDENT_AMBULATORY_CARE_PROVIDER_SITE_OTHER): Payer: 59 | Admitting: Primary Care

## 2016-10-03 ENCOUNTER — Other Ambulatory Visit: Payer: Self-pay | Admitting: Primary Care

## 2016-10-03 ENCOUNTER — Encounter: Payer: Self-pay | Admitting: Primary Care

## 2016-10-03 VITALS — BP 158/100 | HR 88 | Temp 98.3°F | Ht 67.0 in | Wt 176.1 lb

## 2016-10-03 DIAGNOSIS — I63332 Cerebral infarction due to thrombosis of left posterior cerebral artery: Secondary | ICD-10-CM | POA: Diagnosis not present

## 2016-10-03 DIAGNOSIS — E785 Hyperlipidemia, unspecified: Secondary | ICD-10-CM

## 2016-10-03 DIAGNOSIS — I10 Essential (primary) hypertension: Secondary | ICD-10-CM | POA: Diagnosis not present

## 2016-10-03 DIAGNOSIS — J069 Acute upper respiratory infection, unspecified: Secondary | ICD-10-CM

## 2016-10-03 DIAGNOSIS — E1165 Type 2 diabetes mellitus with hyperglycemia: Secondary | ICD-10-CM | POA: Insufficient documentation

## 2016-10-03 DIAGNOSIS — R7303 Prediabetes: Secondary | ICD-10-CM

## 2016-10-03 MED ORDER — BENZONATATE 200 MG PO CAPS: 200.0000 mg | ORAL_CAPSULE | Freq: Three times a day (TID) | ORAL | 0 refills | Status: DC | PRN

## 2016-10-03 MED FILL — LISINOPRIL 5 MG TABLET: 5 | 90 days supply | Qty: 180 | Fill #0

## 2016-10-03 MED FILL — BENZONATATE 200 MG CAPSULE: 200 | 10 days supply | Qty: 30 | Fill #0

## 2016-10-03 NOTE — Patient Instructions (Signed)
Your symptoms are representative of a viral illness which will resolve on its own over time. Our goal is to treat your symptoms in order to aid your body in the healing process and to make you more comfortable.   You may take Benzonatate capsules for cough. Take 1 capsule by mouth three times daily as needed for cough.  Cough/Congestion: Try taking Mucinex DM. This will help loosen up the mucous in your chest. Ensure you take this medication with a full glass of water.  Ensure you are staying hydrated with water.  Please schedule a physical with me in 3 months. You may also schedule a lab only appointment 3-4 days prior. We will discuss your lab results in detail during your physical.  It was a pleasure to see you today!   Upper Respiratory Infection, Adult Most upper respiratory infections (URIs) are a viral infection of the air passages leading to the lungs. A URI affects the nose, throat, and upper air passages. The most common type of URI is nasopharyngitis and is typically referred to as "the common cold." URIs run their course and usually go away on their own. Most of the time, a URI does not require medical attention, but sometimes a bacterial infection in the upper airways can follow a viral infection. This is called a secondary infection. Sinus and middle ear infections are common types of secondary upper respiratory infections. Bacterial pneumonia can also complicate a URI. A URI can worsen asthma and chronic obstructive pulmonary disease (COPD). Sometimes, these complications can require emergency medical care and may be life threatening.  CAUSES Almost all URIs are caused by viruses. A virus is a type of germ and can spread from one person to another.  RISKS FACTORS You may be at risk for a URI if:   You smoke.   You have chronic heart or lung disease.  You have a weakened defense (immune) system.   You are very young or very old.   You have nasal allergies or  asthma.  You work in crowded or poorly ventilated areas.  You work in health care facilities or schools. SIGNS AND SYMPTOMS  Symptoms typically develop 2-3 days after you come in contact with a cold virus. Most viral URIs last 7-10 days. However, viral URIs from the influenza virus (flu virus) can last 14-18 days and are typically more severe. Symptoms may include:   Runny or stuffy (congested) nose.   Sneezing.   Cough.   Sore throat.   Headache.   Fatigue.   Fever.   Loss of appetite.   Pain in your forehead, behind your eyes, and over your cheekbones (sinus pain).  Muscle aches.  DIAGNOSIS  Your health care provider may diagnose a URI by:  Physical exam.  Tests to check that your symptoms are not due to another condition such as:  Strep throat.  Sinusitis.  Pneumonia.  Asthma. TREATMENT  A URI goes away on its own with time. It cannot be cured with medicines, but medicines may be prescribed or recommended to relieve symptoms. Medicines may help:  Reduce your fever.  Reduce your cough.  Relieve nasal congestion. HOME CARE INSTRUCTIONS   Take medicines only as directed by your health care provider.   Gargle warm saltwater or take cough drops to comfort your throat as directed by your health care provider.  Use a warm mist humidifier or inhale steam from a shower to increase air moisture. This may make it easier to breathe.  Drink  enough fluid to keep your urine clear or pale yellow.   Eat soups and other clear broths and maintain good nutrition.   Rest as needed.   Return to work when your temperature has returned to normal or as your health care provider advises. You may need to stay home longer to avoid infecting others. You can also use a face mask and careful hand washing to prevent spread of the virus.  Increase the usage of your inhaler if you have asthma.   Do not use any tobacco products, including cigarettes, chewing tobacco,  or electronic cigarettes. If you need help quitting, ask your health care provider. PREVENTION  The best way to protect yourself from getting a cold is to practice good hygiene.   Avoid oral or hand contact with people with cold symptoms.   Wash your hands often if contact occurs.  There is no clear evidence that vitamin C, vitamin E, echinacea, or exercise reduces the chance of developing a cold. However, it is always recommended to get plenty of rest, exercise, and practice good nutrition.  SEEK MEDICAL CARE IF:   You are getting worse rather than better.   Your symptoms are not controlled by medicine.   You have chills.  You have worsening shortness of breath.  You have brown or red mucus.  You have yellow or brown nasal discharge.  You have pain in your face, especially when you bend forward.  You have a fever.  You have swollen neck glands.  You have pain while swallowing.  You have white areas in the back of your throat. SEEK IMMEDIATE MEDICAL CARE IF:   You have severe or persistent:  Headache.  Ear pain.  Sinus pain.  Chest pain.  You have chronic lung disease and any of the following:  Wheezing.  Prolonged cough.  Coughing up blood.  A change in your usual mucus.  You have a stiff neck.  You have changes in your:  Vision.  Hearing.  Thinking.  Mood. MAKE SURE YOU:   Understand these instructions.  Will watch your condition.  Will get help right away if you are not doing well or get worse.   This information is not intended to replace advice given to you by your health care provider. Make sure you discuss any questions you have with your health care provider.   Document Released: 05/07/2001 Document Revised: 03/28/2015 Document Reviewed: 02/16/2014 Elsevier Interactive Patient Education [employer]2016 Elsevier Inc.

## 2016-10-03 NOTE — Progress Notes (Signed)
Subjective:    Patient ID: Veronica Andrade, female    DOB: 06/09/1951, 65 y.o.   MRN: [id number]  HPI  Veronica Andrade is a 65 year old female who presents today for follow up.  1) Essential Hypertension: Currently managed on lisinopril 5 mg. Her blood pressure in the office today is 158/100. She's been checking her blood pressure at home daily which is running 110-130/70-80's. This morning she checked her BP which was 144/89, yesterday her BP was 120's/80's.  2) CVA/Hyperlipidemia: Currently managed on atorvastatin 40 mg. Lipid panel in April 2017 stable. She is following with neurology. She is managed on Gabapentin for sensory deficits and numbness to the right hand, face, and lower extremity. Overall she's doing well and has started driving again.   3) Prediabetes: A1C of 6.1 on labs in August 2017 which was improved from her prior level of 6.3 in April 2017. She's been exercising, cutting back on salty and carbohydrate foods.   4) Cough: She also reports sneezing, post nasal drip, fever (100.1 on Tuesday, 99.3 yesterday). Her symptoms have been present since Sunday night. She's not taken anything OTC for her symptoms.   Review of Systems  Constitutional: Positive for fatigue and fever.  HENT: Positive for congestion and sneezing.   Respiratory: Positive for cough. Negative for shortness of breath and wheezing.   Cardiovascular: Negative for chest pain.  Gastrointestinal: Negative for nausea.       Past Medical History:  Diagnosis Date  . Hyperlipidemia   . Hypertension   . Stroke [facility](HCC)      Social History   Social History  . Marital status: Married    Spouse name: N/A  . Number of children: N/A  . Years of education: N/A   Occupational History  . Not on file.   Social History Main Topics  . Smoking status: Never Smoker  . Smokeless tobacco: Not on file  . Alcohol use No  . Drug use: No  . Sexual activity: Not on file   Other Topics Concern  . Not on file    Social History Narrative   Married.   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     [place], spending time with family.    Past Surgical History:  Procedure Laterality Date  . BLADDER SURGERY     Bladder Tack  . KNEE ARTHROSCOPY Left   . TUBAL LIGATION      Family History  Problem Relation Age of Onset  . Stroke Father   . Diabetes Father   . Hypertension Father   . Arrhythmia Mother   . Hypertension Mother   . Hypertension Brother     Allergies  Allergen Reactions  . Codeine Nausea Only and Other (See Comments)    Headaches also  . Morphine Nausea Only and Other (See Comments)    Headaches also  . Hydrochlorothiazide Nausea And Vomiting    Current Outpatient Prescriptions on File Prior to Visit  Medication Sig Dispense Refill  . aspirin 325 MG tablet Take 1 tablet (325 mg total) by mouth daily. 60 tablet 0  . atorvastatin (LIPITOR) 40 MG tablet TAKE 1 TABLET BY MOUTH DAILY AT 6 PM. 90 tablet 1  . cholecalciferol (VITAMIN D) 1000 units tablet Take 1,000 Units by mouth daily.    Marland Kitchen. gabapentin (NEURONTIN) 100 MG capsule Take 1 capsule (100 mg total) by mouth at bedtime. 90 capsule 5  . lisinopril (PRINIVIL,ZESTRIL)  5 MG tablet Take 1 tablet (5 mg total) by mouth 2 (two) times daily. 180 tablet 1  . Multiple Vitamins-Minerals (MULTIVITAMIN GUMMIES ADULTS) CHEW Chew 2 each by mouth daily.     No current facility-administered medications on file prior to visit.     BP (!) 158/100   Pulse 88   Temp 98.3 F (36.8 C) (Oral)   Ht 5\' 7"  (1.702 m)   Wt 176 lb 1.9 oz (79.9 kg)   SpO2 97%   BMI 27.58 kg/m    Objective:   Physical Exam  Constitutional: She appears well-nourished.  HENT:  Right Ear: Tympanic membrane and ear canal normal.  Left Ear: Tympanic membrane and ear canal normal.  Nose: Mucosal edema present. Right sinus exhibits no maxillary sinus tenderness and no frontal sinus tenderness. Left sinus  exhibits no maxillary sinus tenderness and no frontal sinus tenderness.  Mouth/Throat: Oropharynx is clear and moist.  Eyes: Conjunctivae are normal.  Neck: Neck supple.  Cardiovascular: Normal rate and regular rhythm.   Pulmonary/Chest: Effort normal and breath sounds normal. She has no wheezes. She has no rales.  Dry cough present  Lymphadenopathy:    She has no cervical adenopathy.  Skin: Skin is warm and dry.          Assessment & Plan:  Viral URI:  Cough, low grade fevers, postnasal drip x 4 days. Exam with dry cough. Clear lungs. Does not appear sickly. Suspect viral URI and will treat with supportive measures. Start Mucinex DM, Tessalon pearls, fluids, rest. Return precautions provided.  Morrie Sheldonlark,Emberley Kral Kendal, NP

## 2016-10-03 NOTE — Assessment & Plan Note (Signed)
Continues to experience some numbness/tingling to right side, overall improving slowly. Lipids well under control. No new symptoms.

## 2016-10-03 NOTE — Assessment & Plan Note (Signed)
A1C of 6.1 in August 2017, improved from 6.3 earlier this year. She is watching her diet and exercising. Repeat in 3 months.

## 2016-10-03 NOTE — Progress Notes (Signed)
Pre visit review using our clinic review tool, if applicable. No additional management support is needed unless otherwise documented below in the visit note. 

## 2016-10-03 NOTE — Assessment & Plan Note (Signed)
Lipid panel in April stable. Repeat next year. Continue atorvastatin.

## 2016-10-03 NOTE — Assessment & Plan Note (Signed)
Above goal in office today, likely due to viral URI as home readings are normal. Will continue to have her monitor readings and report readings over 140/90 once she's recovered.

## 2016-10-15 ENCOUNTER — Other Ambulatory Visit: Payer: Self-pay | Admitting: Primary Care

## 2016-10-15 MED FILL — ATORVASTATIN 40 MG TABLET: 40 | 90 days supply | Qty: 90 | Fill #0

## 2016-10-24 ENCOUNTER — Ambulatory Visit: Payer: 59 | Admitting: Neurology

## 2016-11-20 MED FILL — GABAPENTIN 100 MG CAPSULE: 100 | 30 days supply | Qty: 90 | Fill #4

## 2016-12-09 ENCOUNTER — Telehealth: Payer: Self-pay | Admitting: Neurology

## 2016-12-09 NOTE — Telephone Encounter (Signed)
Was the appointment at 8:30 on 12/18/16? Please keep her original appointment. I have meeting from 7:30 to 8:30 that day but I have recently told the meeting organizer that I have to leave before 8:30 and they are OK with that. So I think we still able to keep her appointment. Thanks.   Marvel PlanJindong Yerlin Gasparyan, MD PhD Stroke Neurology 12/09/2016 2:40 PM

## 2016-12-09 NOTE — Telephone Encounter (Signed)
We had to r/s 1/24 appt due to meeting for Dr. Roda ShuttersXu. First available was scheduled for 5/1. She also has FMLA forms and needs to be seen sooner than May.

## 2016-12-10 NOTE — Telephone Encounter (Signed)
Aundra MilletMegan are you able to open Dr Warren DanesXu's schedule for 8:30 on 1/24 as he stated in his response attached.

## 2016-12-16 NOTE — Telephone Encounter (Signed)
I have opened this slot

## 2016-12-17 ENCOUNTER — Other Ambulatory Visit: Payer: Self-pay | Admitting: Primary Care

## 2016-12-17 DIAGNOSIS — E785 Hyperlipidemia, unspecified: Secondary | ICD-10-CM

## 2016-12-17 DIAGNOSIS — E559 Vitamin D deficiency, unspecified: Secondary | ICD-10-CM

## 2016-12-17 DIAGNOSIS — Z1159 Encounter for screening for other viral diseases: Secondary | ICD-10-CM

## 2016-12-17 DIAGNOSIS — R7303 Prediabetes: Secondary | ICD-10-CM

## 2016-12-18 ENCOUNTER — Ambulatory Visit (INDEPENDENT_AMBULATORY_CARE_PROVIDER_SITE_OTHER): Payer: PPO | Admitting: Neurology

## 2016-12-18 ENCOUNTER — Encounter: Payer: Self-pay | Admitting: Neurology

## 2016-12-18 ENCOUNTER — Ambulatory Visit: Payer: 59 | Admitting: Neurology

## 2016-12-18 VITALS — BP 151/89 | HR 78 | Ht 67.0 in | Wt 180.0 lb

## 2016-12-18 DIAGNOSIS — E785 Hyperlipidemia, unspecified: Secondary | ICD-10-CM | POA: Diagnosis not present

## 2016-12-18 DIAGNOSIS — I10 Essential (primary) hypertension: Secondary | ICD-10-CM | POA: Diagnosis not present

## 2016-12-18 DIAGNOSIS — I63332 Cerebral infarction due to thrombosis of left posterior cerebral artery: Secondary | ICD-10-CM

## 2016-12-18 NOTE — Patient Instructions (Signed)
-   continue ASA and lipitor for stroke prevention - continue gabapentin for right sided numbness and heaviness.  - check BP at home and record  - Follow up with your primary care physician for stroke risk factor modification. Recommend maintain blood pressure goal <130/80, diabetes with hemoglobin A1c goal below 6.5% and lipids with LDL cholesterol goal below 70 mg/dL.  - healthy diet and continue home exercise - follow up in 6 months.

## 2016-12-18 NOTE — Progress Notes (Signed)
STROKE NEUROLOGY FOLLOW UP NOTE  NAME: Veronica Andrade DOB: 08/24/1951  REASON FOR VISIT: stroke follow up HISTORY FROM: pt and chart  Today we had the pleasure of seeing Veronica Andrade in follow-up at our Neurology Clinic. Pt was accompanied by no one.   History Summary Veronica Andrade is a 66 y.o. female with history a history of hypertension was admitted on 11/24/15 for sudden onset of right-sided numbness and mild right arm weakness. She did not receive IV t-PA due to mild deficits. MRI showed acute left thalamic infarct, likely small vessel disease. CTA head and neck unremarkable, EF 65-70%. LDL 150 and A1C 6.6. She was discharged on ASA and lipitor 40.   Follow up 02/13/16 - the patient has been doing OK. Continued to have right sided numbness, tingling and mild pain. Felt heaviness all the time with right arm, not able to write well and not able to type well. Difficulty using computer. Continued exercise and had high hope to get better soon. However, fluctuating mood with hope and depression. Followed up with Darrol Angelarolyn Martin NP on 12/27/15, no recurrent stroke symptoms and continued ASA and lipitor. Pt came in today with continued symptoms. BP 176/100 but she stated her BP is always 130/80 at home. On low dose lisinopril.    04/23/16 follow up - pt has been doing well. Still has right sided numbness and tingling, taking gabapentin at night only. Helps for sleep. She is active in home exercise and walks 3 miles 4 times a week. BP controlled well at home. Finished PT/OT at the end of March.   Interval History During the interval time, pt has been doing the same. Still has right sided numbness and tingling, especially at night, gabapentin helps at night. Complains of occasional sharp pain at right arm with difficulty with right arm maneuver. Sometimes the pain occurs at right face and right thigh. She is doing home exercise and currently not able to back to work. On ASA and lipitor. BP at  home always 120-130s but today in clinic 151/89. She said she was nervous.   REVIEW OF SYSTEMS: Full 14 system review of systems performed and notable only for those listed below and in HPI above, all others are negative:  Constitutional:  excessive sweating Cardiovascular:  Ear/Nose/Throat:  Runny nose Skin:  Eyes:  Eye discharges, eye redness Respiratory:  cough Gastroitestinal:  Incontinence of bowels Genitourinary: incontinence of bladder Hematology/Lymphatic:   Endocrine:  Musculoskeletal:  Muscle cramps, neck stiffness Allergy/Immunology:   Neurological:  Numbness and weakness Psychiatric:  Sleep: insomnia  The following represents the patient's updated allergies and side effects list: Allergies  Allergen Reactions  . Codeine Nausea Only and Other (See Comments)    Headaches also  . Morphine Nausea Only and Other (See Comments)    Headaches also  . Hydrochlorothiazide Nausea And Vomiting    The neurologically relevant items on the patient's problem list were reviewed on today's visit.  Neurologic Examination  A problem focused neurological exam (12 or more points of the single system neurologic examination, vital signs counts as 1 point, cranial nerves count for 8 points) was performed.  Blood pressure (!) 151/89, pulse 78, height 5\' 7"  (1.702 m), weight 180 lb (81.6 kg).  General - Well nourished, well developed, in no apparent distress.  Ophthalmologic - Sharp disc margins OU.   Cardiovascular - Regular rate and rhythm.  Mental Status -  Level of arousal and orientation to time, place, and person  were intact. Language including expression, naming, repetition, comprehension was assessed and found intact. Fund of Knowledge was assessed and was intact.  Cranial Nerves II - XII - II - Visual field intact OU. III, IV, VI - Extraocular movements intact. V - Facial sensation decreased on the right.Marland Kitchen VII - Facial movement intact bilaterally. VIII - Hearing &  vestibular intact bilaterally. X - Palate elevates symmetrically. XI - Chin turning & shoulder shrug intact bilaterally. XII - Tongue protrusion intact.  Motor Strength - The patient's strength was normal in all extremities except right arm 4/5 due to painful feeling and right hand dexterity difficulty on the right hand on exam but she has normal handwriting and pronator drift was absent.  Bulk was normal and fasciculations were absent.   Motor Tone - Muscle tone was assessed at the neck and appendages and was normal.  Reflexes - The patient's reflexes were 1+ in all extremities and she had no pathological reflexes.  Sensory - Light touch, temperature/pinprick were assessed and were decreased on the right comparing with the left.    Coordination- The patient had normal movements in the hands and feet with no ataxia or dysmetria.  Tremor was absent.  Gait and Station - normal gait and station.  Data reviewed: I personally reviewed the images and agree with the radiology interpretations.  Ct Head Wo Contrast 11/24/2015  No acute intracranial abnormalities. The appearance of the brain is normal.   MRI brain without contrast 11/25/2015 Acute LEFT posterolateral thalamic nonhemorrhagic infarct. Mild chronic microvascular ischemic change.  CTA Head and Neck 11/26/2015 CTA Neck Minimal plaque carotid bifurcation bilaterally without significant narrowing. Minimal plaque origin right vertebral artery without significant narrowing. Codominant vertebral arteries. CTA Head Anterior circulation without medium or large size vessel significant stenosis or occlusion. Minimal calcified plaque right internal carotid artery cavernous segment. Mild to moderate narrowing P1 and P2 segment posterior cerebral arteries bilaterally. CT Head Acute nonhemorrhagic left thalamic/posterior limb left internal capsule infarct.  TTE - Left ventricle: The cavity size was normal. There was  moderate concentric hypertrophy. Systolic function was vigorous. The estimated ejection fraction was in the range of 65% to 70%. Wall motion was normal; there were no regional wall motion abnormalities. Doppler parameters are consistent with abnormal left ventricular relaxation (grade 1 diastolic dysfunction). There was no evidence of elevated ventricular filling pressure by Doppler parameters. - Aortic valve: Trileaflet; normal thickness leaflets. - Aortic root: The aortic root was normal in size. - Mitral valve: Structurally normal valve. - Right ventricle: The cavity size was normal. Wall thickness was normal. Systolic function was normal. - Right atrium: The atrium was normal in size. - Tricuspid valve: There was trivial regurgitation. - Pulmonic valve: There was no regurgitation. - Pulmonary arteries: Systolic pressure was within the normal range. - Inferior vena cava: The vessel was normal in size. - Pericardium, extracardiac: There was no pericardial effusion.  Component     Latest Ref Rng 11/25/2015  Cholesterol     0 - 200 mg/dL 161 (H)  Triglycerides     <150 mg/dL 096  HDL Cholesterol     >40 mg/dL 37 (L)  Total CHOL/HDL Ratio      5.7  VLDL     0 - 40 mg/dL 24  LDL (calc)     0 - 99 mg/dL 045 (H)  Hemoglobin W0J     4.8 - 5.6 % 6.6 (H)  Mean Plasma Glucose      143    Assessment:  As you may recall, she is a 66 y.o. Caucasian female with PMH of hypertension was admitted on 11/24/15 for acute left thalamic infarct, likely small vessel disease. CTA head and neck unremarkable, EF 65-70%. LDL 150 and A1C 6.6. She was discharged on ASA and lipitor 40. During the interval time, the patient had fluctuating mood with hope and depression. Followed up with Darrol Angel NP on 12/27/15, no recurrent stroke symptoms and continued ASA and lipitor. Due to right sided numbness and burning sensation, on gabapentin at night. During the interval time, she has been  doing well, still has right sided numbness and tingling with occasional sharp pain. Doing home exercise. BP stable at home.  Plan:  - continue ASA and lipitor for stroke prevention - continue gabapentin for right sided numbness and heaviness.  - check BP at home and record  - Follow up with your primary care physician for stroke risk factor modification. Recommend maintain blood pressure goal <130/80, diabetes with hemoglobin A1c goal below 6.5% and lipids with LDL cholesterol goal below 70 mg/dL.  - healthy diet and continue home exercise - follow up in 6 months.   I spent more than 25 minutes of face to face time with the patient. Greater than 50% of time was spent in counseling and coordination of care. We discussed about right arm painful feeling, continue gabpention and continue home exercise.   No orders of the defined types were placed in this encounter.   No orders of the defined types were placed in this encounter.   Patient Instructions  - continue ASA and lipitor for stroke prevention - continue gabapentin for right sided numbness and heaviness.  - check BP at home and record  - Follow up with your primary care physician for stroke risk factor modification. Recommend maintain blood pressure goal <130/80, diabetes with hemoglobin A1c goal below 6.5% and lipids with LDL cholesterol goal below 70 mg/dL.  - healthy diet and continue home exercise - follow up in 6 months.    Marvel Plan, MD PhD [facility] [address], [address] [place], [place] [address] [phone]

## 2016-12-19 ENCOUNTER — Encounter: Payer: Self-pay | Admitting: Neurology

## 2016-12-27 ENCOUNTER — Other Ambulatory Visit: Payer: 59

## 2017-01-01 MED FILL — LISINOPRIL 5 MG TABLET: 5 | 90 days supply | Qty: 180 | Fill #1

## 2017-01-03 ENCOUNTER — Encounter: Payer: 59 | Admitting: Primary Care

## 2017-01-20 ENCOUNTER — Telehealth: Payer: Self-pay | Admitting: Neurology

## 2017-01-20 NOTE — Telephone Encounter (Signed)
Pt is calling, has clinic rec'd long term disability forms this year?

## 2017-01-20 NOTE — Telephone Encounter (Signed)
Dr. Roda ShuttersXu are we still doing disability on this patient.  Please advise me thanks. Pt no longer has a job at [employer] per her last visit.

## 2017-01-21 NOTE — Telephone Encounter (Signed)
I saw her one month ago in clinic and she was requesting some forms to be filled out. I am not sure what I wrote will help her but she just wanted me to fill out the form. I was OK with that. Did we receive any form? If so, I can fill it out. Thanks.   Marvel PlanJindong Kharis Lapenna, MD PhD Stroke Neurology 01/21/2017 10:16 PM

## 2017-01-23 DIAGNOSIS — Z0289 Encounter for other administrative examinations: Secondary | ICD-10-CM

## 2017-01-23 NOTE — Telephone Encounter (Signed)
Paperwork put on Dr.Xu desk for patient to review.

## 2017-01-28 NOTE — Telephone Encounter (Signed)
Form completed by Dr. Abbie SonsXu.Pt needs to submit 50.00 dollars for payment.Form not fax. Sent to medical records for payment and to fax.

## 2017-01-31 ENCOUNTER — Other Ambulatory Visit (INDEPENDENT_AMBULATORY_CARE_PROVIDER_SITE_OTHER): Payer: PPO

## 2017-01-31 DIAGNOSIS — R7303 Prediabetes: Secondary | ICD-10-CM

## 2017-01-31 DIAGNOSIS — E559 Vitamin D deficiency, unspecified: Secondary | ICD-10-CM

## 2017-01-31 DIAGNOSIS — E785 Hyperlipidemia, unspecified: Secondary | ICD-10-CM | POA: Diagnosis not present

## 2017-01-31 DIAGNOSIS — Z1159 Encounter for screening for other viral diseases: Secondary | ICD-10-CM | POA: Diagnosis not present

## 2017-01-31 LAB — LIPID PANEL
CHOL/HDL RATIO: 4
Cholesterol: 134 mg/dL (ref 0–200)
HDL: 36.1 mg/dL — AB (ref >39.00)
LDL Cholesterol: 73 mg/dL (ref 0–99)
NonHDL: 98.05
Triglycerides: 123 mg/dL (ref 0.0–149.0)
VLDL: 24.6 mg/dL (ref 0.0–40.0)

## 2017-01-31 LAB — COMPREHENSIVE METABOLIC PANEL
ALT: 16 U/L (ref 0–35)
AST: 18 U/L (ref 0–37)
Albumin: 4.6 g/dL (ref 3.5–5.2)
Alkaline Phosphatase: 70 U/L (ref 39–117)
BUN: 16 mg/dL (ref 6–23)
CHLORIDE: 106 meq/L (ref 96–112)
CO2: 29 mEq/L (ref 19–32)
Calcium: 9.8 mg/dL (ref 8.4–10.5)
Creatinine, Ser: 0.74 mg/dL (ref 0.40–1.20)
GFR: 83.54 mL/min (ref >60.00)
GLUCOSE: 120 mg/dL — AB (ref 70–99)
POTASSIUM: 4.5 meq/L (ref 3.5–5.1)
SODIUM: 141 meq/L (ref 135–145)
TOTAL PROTEIN: 7.5 g/dL (ref 6.0–8.3)
Total Bilirubin: 0.5 mg/dL (ref 0.2–1.2)

## 2017-01-31 LAB — VITAMIN D 25 HYDROXY (VIT D DEFICIENCY, FRACTURES): VITD: 34.02 ng/mL (ref 30.00–100.00)

## 2017-01-31 LAB — HEMOGLOBIN A1C: Hgb A1c MFr Bld: 6.4 % (ref 4.6–6.5)

## 2017-02-01 LAB — HEPATITIS C ANTIBODY: HCV Ab: NEGATIVE

## 2017-02-07 ENCOUNTER — Encounter: Payer: Self-pay | Admitting: Primary Care

## 2017-02-07 ENCOUNTER — Ambulatory Visit (INDEPENDENT_AMBULATORY_CARE_PROVIDER_SITE_OTHER): Payer: PPO | Admitting: Primary Care

## 2017-02-07 VITALS — BP 168/102 | HR 75 | Temp 98.4°F | Ht 67.0 in | Wt 182.0 lb

## 2017-02-07 DIAGNOSIS — J069 Acute upper respiratory infection, unspecified: Secondary | ICD-10-CM

## 2017-02-07 DIAGNOSIS — E2839 Other primary ovarian failure: Secondary | ICD-10-CM

## 2017-02-07 DIAGNOSIS — Z0000 Encounter for general adult medical examination without abnormal findings: Secondary | ICD-10-CM | POA: Insufficient documentation

## 2017-02-07 DIAGNOSIS — I10 Essential (primary) hypertension: Secondary | ICD-10-CM

## 2017-02-07 DIAGNOSIS — R7303 Prediabetes: Secondary | ICD-10-CM

## 2017-02-07 DIAGNOSIS — E785 Hyperlipidemia, unspecified: Secondary | ICD-10-CM

## 2017-02-07 DIAGNOSIS — I63332 Cerebral infarction due to thrombosis of left posterior cerebral artery: Secondary | ICD-10-CM

## 2017-02-07 MED ORDER — BENZONATATE 200 MG PO CAPS: 200.0000 mg | ORAL_CAPSULE | Freq: Three times a day (TID) | ORAL | 0 refills | Status: DC | PRN

## 2017-02-07 MED ORDER — LISINOPRIL 10 MG PO TABS: 10.0000 mg | ORAL_TABLET | Freq: Two times a day (BID) | ORAL | 1 refills | Status: DC

## 2017-02-07 MED FILL — BENZONATATE 200 MG CAPSULE: 200 | 10 days supply | Qty: 30 | Fill #0

## 2017-02-07 NOTE — Progress Notes (Signed)
Patient ID: VIHANA KYDD, female   DOB: 10/07/1951, 66 y.o.   MRN: [id number]  HPI: Ms. Fullenwider is a 66 year old female who presents today for Welcome to Medicare Visit.  1) Essential Hypertension: Currently managed on lisinopril 5 mg BID. Her BP in the office today is 168/102. Her home BP readings in January 2018 was 120s/80's, She's had several viral URI's since late January. Starting in late January 2018 she's had numbers increase and stay in the 130's-160's/80's-90's. She did take three of her lisinopril in early March due to reading of 156/105. Her BP the following day was 142/90. She denies chest pain, dizziness, lower extremity edema.   Past Medical History:  Diagnosis Date  . Hyperlipidemia   . Hypertension   . Stroke [facility])     Current Outpatient Prescriptions  Medication Sig Dispense Refill  . aspirin 325 MG tablet Take 1 tablet (325 mg total) by mouth daily. 60 tablet 0  . atorvastatin (LIPITOR) 40 MG tablet TAKE 1 TABLET BY MOUTH ONCE DAILY AT 6 PM. 90 tablet 1  . cholecalciferol (VITAMIN D) 1000 units tablet Take 1,000 Units by mouth daily.    Marland Kitchen gabapentin (NEURONTIN) 100 MG capsule Take 1 capsule (100 mg total) by mouth at bedtime. 90 capsule 5  . lisinopril (PRINIVIL,ZESTRIL) 5 MG tablet TAKE 1 TABLET BY MOUTH 2 TIMES DAILY. 180 tablet 1  . Multiple Vitamins-Minerals (MULTIVITAMIN GUMMIES ADULTS) CHEW Chew 2 each by mouth daily.     No current facility-administered medications for this visit.     Allergies  Allergen Reactions  . Codeine Nausea Only and Other (See Comments)    Headaches also  . Morphine Nausea Only and Other (See Comments)    Headaches also  . Hydrochlorothiazide Nausea And Vomiting    Family History  Problem Relation Age of Onset  . Stroke Father   . Diabetes Father   . Hypertension Father   . Arrhythmia Mother   . Hypertension Mother   . Hypertension Brother     Social History   Social History  . Marital status: Married    Spouse name:  N/A  . Number of children: N/A  . Years of education: N/A   Occupational History  . Not on file.   Social History Main Topics  . Smoking status: Never Smoker  . Smokeless tobacco: Never Used  . Alcohol use No  . Drug use: No  . Sexual activity: Not on file   Other Topics Concern  . Not on file   Social History Narrative   Married.   2 children, 4 g.children   Works at [employer], Chief of Staff.     Enjoys spending time in [place], camping, spending time with family.    Hospitiliaztions: None in 2017  Health Maintenance:    Flu: Completed in 2017  Tetanus: UTD  Pneumovax: Never completed  Prevnar: Never completed  Zostavax: Never completed  Bone Density: Never completed  Colonoscopy: Never completed, declines, would like to think.   Eye Doctor: Completed in March 2017  Dental Exam: Completes annually  Mammogram: Completed in November 2017  Pap: Due, will schedule when feeling better  Hep C Screening: Negative    Providers: Vernona Rieger, PCP; Dr. Roda Shutters, Neurology   I have personally reviewed and have noted: 1. The patient's medical and social history 2. Their use of alcohol, tobacco or illicit drugs 3. Their current medications and supplements 4. The patient's functional ability including ADL's, fall risks, home safety risks  and hearing or visual impairment. 5. Diet and physical activities 6. Evidence for depression or mood disorder  Subjective:   Review of Systems:   Constitutional: Denies fever. Does feel tired from recent cold symptoms. HEENT: Nasal congestion, sore throat, cough, chest congestion, post nasal drip for the past three days. She's been taking benzonatate capsules and Mucinex with some improvement. She denies fevers. Respiratory: Some SOB with recent URI illness, otherwise denies daily shortness of breath. Cardiovascular: Denies chest pain, chest tightness, palpitations or swelling in the hands or feet.  Gastrointestinal: Denies abdominal pain,  bloating, constipation, diarrhea or blood in the stool.  GU: Denies urgency, frequency, pain with urination, burning sensation, blood in urine, odor or discharge. Musculoskeletal: Denies decrease in range of motion, difficulty with gait, muscle pain or joint pain and swelling.  Skin: Denies redness, rashes, lesions or ulcercations.  Neurological: Denies dizziness, difficulty with memory, difficulty with speech or problems with balance and coordination. Does have some residual numbness right upper and lower extremities from CVA, denies weakness.  Psychiatric: Denies concerns for anxiety or depression.   No other specific complaints in a complete review of systems (except as listed in HPI above).  Objective:  PE:   BP (!) 168/102   Pulse 75   Temp 98.4 F (36.9 C) (Oral)   Ht 5\' 7"  (1.702 m)   Wt 182 lb (82.6 kg)   SpO2 98%   BMI 28.51 kg/m  Wt Readings from Last 3 Encounters:  02/07/17 182 lb (82.6 kg)  12/18/16 180 lb (81.6 kg)  10/03/16 176 lb 1.9 oz (79.9 kg)    General: Appears their stated age, well developed, well nourished in NAD. Skin: Warm, dry and intact. No rashes, lesions or ulcerations noted. HEENT: Head: normal shape and size; Eyes: sclera white, no icterus, conjunctiva pink, PERRLA and EOMs intact; Ears: Tm's gray and intact, normal light reflex; Nose: mucosa pink and moist, septum midline; Throat/Mouth: Teeth present, mucosa pink and moist, no exudate, lesions or ulcerations noted. Nasal mucosal edema noted. Neck: Normal range of motion. Neck supple, trachea midline. No massses, lumps or thyromegaly present.  Cardiovascular: Normal rate and rhythm. S1,S2 noted.  No murmur, rubs or gallops noted. No JVD or BLE edema. No carotid bruits noted. Pulmonary/Chest: Normal effort and positive vesicular breath sounds. No respiratory distress. No wheezes, rales or ronchi noted.  Abdomen: Soft and nontender. Normal bowel sounds, no bruits noted. No distention or masses noted.  Liver, spleen and kidneys non palpable. Musculoskeletal: Normal range of motion. No signs of joint swelling. No difficulty with gait.  Neurological: Alert and oriented. Cranial nerves II-XII intact. Coordination normal. +DTRs bilaterally. Psychiatric: Mood and affect normal. Behavior is normal. Judgment and thought content normal.   EKG: Due, deferred today given URI symptoms.  BMET    Component Value Date/Time   NA 141 01/31/2017 0811   K 4.5 01/31/2017 0811   CL 106 01/31/2017 0811   CO2 29 01/31/2017 0811   GLUCOSE 120 (H) 01/31/2017 0811   BUN 16 01/31/2017 0811   CREATININE 0.74 01/31/2017 0811   CALCIUM 9.8 01/31/2017 0811   GFRNONAA >60 11/24/2015 1934   GFRAA >60 11/24/2015 1934    Lipid Panel     Component Value Date/Time   CHOL 134 01/31/2017 0811   TRIG 123.0 01/31/2017 0811   HDL 36.10 (L) 01/31/2017 0811   CHOLHDL 4 01/31/2017 0811   VLDL 24.6 01/31/2017 0811   LDLCALC 73 01/31/2017 0811    CBC  Component Value Date/Time   WBC 7.5 11/24/2015 1934   RBC 4.54 11/24/2015 1934   HGB 15.6 (H) 11/24/2015 1935   HCT 46.0 11/24/2015 1935   PLT 186 11/24/2015 1934   MCV 89.2 11/24/2015 1934   MCH 29.5 11/24/2015 1934   MCHC 33.1 11/24/2015 1934   RDW 13.2 11/24/2015 1934   LYMPHSABS 3.1 11/24/2015 1934   MONOABS 0.6 11/24/2015 1934   EOSABS 0.1 11/24/2015 1934   BASOSABS 0.0 11/24/2015 1934    Hgb A1C Lab Results  Component Value Date   HGBA1C 6.4 01/31/2017      Assessment and Plan:   Medicare Annual Wellness Visit:  Diet: She endorses a fair diet. Breakfast: Quiche, vegetables Lunch: Sandwich Dinner: [employer], vegetables, pasta, steaks, pork. Restaurants twice weekly. Snacks: Cookies, chips and dip Desserts: Several times weekly. Beverages: Water, occasional sweet tea with splenda. Physical activity: She works out three times weekly at [employer], less frequently since recurrent URI's. Depression/mood screen: Negative Hearing: Intact  to whispered voice Visual acuity: Grossly normal, performs annual eye exam  ADLs: Capable Fall risk: None Home safety: Good Cognitive evaluation: Intact to orientation, naming, recall and repetition EOL planning: Adv directives, HCPA Oral Shirah, full code/ I agree  Preventative Medicine: Prevnar and Shingrix due. Given current URI symptoms, will defer Prevnar until next visit. She declines shingles vaccination. Immunizations otherwise UTD. Pap due, she declines and will defer when feeling better. Mammogram UTD. Colonoscopy due, she will think about Cologuard. Bone density scanning due, ordered. Discussed the importance of a healthy diet and regular exercise in order for weight loss, and to reduce the risk of other medical diseases. Exam with viral URI, otherwise unremarkable. Labs with prediabetes which was discussed. All recommendations provided at end of visit today. ECG deferred to next visit when feeling better. BP control addressed. URI viral in nature, unremarkable exam. Treat URI with benzonatate, Mucinex, fluids, rest. She will update if no improvement.   Next appointment: Three weeks for BP re-evaluation.

## 2017-02-07 NOTE — Assessment & Plan Note (Signed)
Recent A1C of 6.4. She has been eating a lot of sweets over the past several months. Discussed the importance of a healthy diet and regular exercise in order for weight loss, and to reduce the risk of other medical diseases. Repeat in 3 months.

## 2017-02-07 NOTE — Assessment & Plan Note (Signed)
Uncontrolled since late January 2018 based off home readings, BP above goal today. Increase lisinopril to 10 mg BID. Recent BMP unremarkable. Follow up in 3 weeks for re-evaluation.

## 2017-02-07 NOTE — Assessment & Plan Note (Signed)
Residual numbness to right upper extremities. Good strength bilaterally today. No new symptoms.

## 2017-02-07 NOTE — Assessment & Plan Note (Signed)
Prevnar and Shingrix due. Given current URI symptoms, will defer Prevnar until next visit. She declines shingles vaccination. Immunizations otherwise UTD. Pap due, she declines and will defer when feeling better. Mammogram UTD. Colonoscopy due, she will think about Cologuard. Bone density scanning due, ordered. Discussed the importance of a healthy diet and regular exercise in order for weight loss, and to reduce the risk of other medical diseases. Exam with viral URI, otherwise unremarkable. Labs with prediabetes which was discussed. All recommendations provided at end of visit today. ECG deferred to next visit when feeling better. BP control addressed.  I have personally reviewed and have noted: 1. The patient's medical and social history 2. Their use of alcohol, tobacco or illicit drugs 3. Their current medications and supplements 4. The patient's functional ability including ADL's, fall  risks, home safety risks and hearing or visual  impairment. 5. Diet and physical activities 6. Evidence for depression or mood disorder

## 2017-02-07 NOTE — Assessment & Plan Note (Signed)
Stable on recent labs, continue atorvastatin 40 mg.

## 2017-02-07 NOTE — Patient Instructions (Addendum)
We've increased your dose of lisinopril from 5 mg twice daily to 10 mg twice daily. I sent a new prescription to your pharmacy.  Continue to monitor your blood pressure daily, bring your readings to your next visit.  You may take Benzonatate capsules for cough. Take 1 capsule by mouth three times daily as needed for cough.  Think about Cologuard or the Colonoscopy as these are recommended.  We will provide you with your pneumonia vaccination next visit when you are not sick.  It's important to improve your diet by reducing consumption of fast food, fried food, processed snack foods, sugary drinks. Increase consumption of fresh vegetables and fruits, whole grains, water.  Ensure you are drinking 64 ounces of water daily.  Start exercising again. You should be getting 150 minutes of moderate intensity exercise weekly.  Follow up in 3 weeks for re-evaluation of your blood pressure.  It was a pleasure to see you today!

## 2017-02-07 NOTE — Progress Notes (Signed)
Pre visit review using our clinic review tool, if applicable. No additional management support is needed unless otherwise documented below in the visit note. 

## 2017-02-26 MED FILL — ATORVASTATIN 40 MG TABLET: 40 | 90 days supply | Qty: 90 | Fill #1

## 2017-02-28 ENCOUNTER — Ambulatory Visit: Payer: PPO | Admitting: Primary Care

## 2017-03-10 MED FILL — LISINOPRIL 10 MG TABLET: 10 | 30 days supply | Qty: 60 | Fill #0

## 2017-03-13 ENCOUNTER — Encounter: Payer: Self-pay | Admitting: Primary Care

## 2017-03-13 ENCOUNTER — Ambulatory Visit (INDEPENDENT_AMBULATORY_CARE_PROVIDER_SITE_OTHER): Payer: PPO | Admitting: Primary Care

## 2017-03-13 ENCOUNTER — Other Ambulatory Visit (HOSPITAL_COMMUNITY)
Admission: RE | Admit: 2017-03-13 | Discharge: 2017-03-13 | Disposition: A | Discharge status: Home / Self Care | Payer: PPO | Source: Ambulatory Visit | Attending: Internal Medicine | Admitting: Internal Medicine

## 2017-03-13 VITALS — BP 148/94 | HR 68 | Temp 98.1°F | Ht 67.0 in | Wt 181.4 lb

## 2017-03-13 DIAGNOSIS — I10 Essential (primary) hypertension: Secondary | ICD-10-CM | POA: Diagnosis not present

## 2017-03-13 DIAGNOSIS — Z124 Encounter for screening for malignant neoplasm of cervix: Secondary | ICD-10-CM | POA: Diagnosis not present

## 2017-03-13 DIAGNOSIS — Z23 Encounter for immunization: Secondary | ICD-10-CM

## 2017-03-13 NOTE — Patient Instructions (Addendum)
Continue Lisinopril 10 mg twice daily. Please notify me if your cough becomes bothersome.  We will notify you of your Pap results once received.  You were provided with your first pneumonia vaccination, the next one will be due next year.  Schedule a lab only appointment in 3 months for repeat A1C to test your blood sugar level.   It was a pleasure to see you today!

## 2017-03-13 NOTE — Progress Notes (Signed)
Pre visit review using our clinic review tool, if applicable. No additional management support is needed unless otherwise documented below in the visit note. 

## 2017-03-13 NOTE — Progress Notes (Signed)
Subjective:    Patient ID: Veronica Andrade, female    DOB: 1951/04/05, 66 y.o.   MRN: [id number]  HPI  Veronica Andrade is a 66 year old female who presents today for follow up of hypertension. Currently managed on lisinopril 10 mg twice daily that was increased during her visit last week due to numerous elevations of 130-160/80-90.   Her blood pressure today is 148/94. Since her last visit her home blood pressure is running 120-130's/70's-80's. She has noticed an occasional mild cough since the increase in her lisinopril dose which is tolerable. She denies dizziness, chest pain. Overall she's feeling very good.   She is also due for her Pap. She did not have this done during her MWV in early March as she was acutely ill. She is also due for Prevnar 13.  Review of Systems  Eyes: Negative for visual disturbance.  Respiratory: Negative for shortness of breath.        Occasional mild cough  Cardiovascular: Negative for chest pain.  Genitourinary: Negative for vaginal discharge.  Neurological: Negative for dizziness.       Past Medical History:  Diagnosis Date  . Hyperlipidemia   . Hypertension   . Stroke [facility])      Social History   Social History  . Marital status: Married    Spouse name: N/A  . Number of children: N/A  . Years of education: N/A   Occupational History  . Not on file.   Social History Main Topics  . Smoking status: Never Smoker  . Smokeless tobacco: Never Used  . Alcohol use No  . Drug use: No  . Sexual activity: Not on file   Other Topics Concern  . Not on file   Social History Narrative   Married.   2 children, 4 g.children   Works at [employer], Chief of Staff.     Enjoys spending time in [place], camping, spending time with family.    Past Surgical History:  Procedure Laterality Date  . BLADDER SURGERY     Bladder Tack  . KNEE ARTHROSCOPY Left   . TUBAL LIGATION      Family History  Problem Relation Age of Onset  . Stroke Father   .  Diabetes Father   . Hypertension Father   . Arrhythmia Mother   . Hypertension Mother   . Hypertension Brother     Allergies  Allergen Reactions  . Codeine Nausea Only and Other (See Comments)    Headaches also  . Morphine Nausea Only and Other (See Comments)    Headaches also  . Hydrochlorothiazide Nausea And Vomiting    Current Outpatient Prescriptions on File Prior to Visit  Medication Sig Dispense Refill  . aspirin 325 MG tablet Take 1 tablet (325 mg total) by mouth daily. 60 tablet 0  . atorvastatin (LIPITOR) 40 MG tablet TAKE 1 TABLET BY MOUTH ONCE DAILY AT 6 PM. 90 tablet 1  . cholecalciferol (VITAMIN D) 1000 units tablet Take 1,000 Units by mouth daily.    Marland Kitchen gabapentin (NEURONTIN) 100 MG capsule Take 1 capsule (100 mg total) by mouth at bedtime. 90 capsule 5  . lisinopril (PRINIVIL,ZESTRIL) 10 MG tablet Take 1 tablet (10 mg total) by mouth 2 (two) times daily. 60 tablet 1  . Multiple Vitamins-Minerals (MULTIVITAMIN GUMMIES ADULTS) CHEW Chew 2 each by mouth daily.     No current facility-administered medications on file prior to visit.     BP (!) 148/94   Pulse 68  Temp 98.1 F (36.7 C) (Oral)   Ht  (1.702 m)   Wt 181 lb 6.4 oz (82.3 kg)   SpO2 98%   BMI 28.41 kg/m    Objective:   Physical Exam  Constitutional: She appears well-nourished.  Neck: Neck supple.  Cardiovascular: Normal rate and regular rhythm.   Pulmonary/Chest: Effort normal and breath sounds normal.  Genitourinary: There is no tenderness or lesion on the right labia. There is no tenderness or lesion on the left labia. Cervix exhibits no motion tenderness and no discharge. Right adnexum displays no tenderness. Left adnexum displays no tenderness. No vaginal discharge found.  Skin: Skin is warm and dry.          Assessment & Plan:

## 2017-03-13 NOTE — Addendum Note (Signed)
Addended by: Tawnya Crook on: 03/13/2017 10:31 AM   Modules accepted: Orders

## 2017-03-13 NOTE — Assessment & Plan Note (Signed)
Home readings stable, continue lisinopril 10 mg BID. Discussed to notify if cough becomes bothersome.

## 2017-03-14 LAB — CYTOLOGY - PAP
Diagnosis: NEGATIVE
HPV: NOT DETECTED

## 2017-03-25 ENCOUNTER — Ambulatory Visit: Payer: 59 | Admitting: Neurology

## 2017-03-26 ENCOUNTER — Ambulatory Visit: Payer: 59 | Admitting: Neurology

## 2017-03-27 ENCOUNTER — Other Ambulatory Visit: Payer: Self-pay | Admitting: Neurology

## 2017-03-27 DIAGNOSIS — I63332 Cerebral infarction due to thrombosis of left posterior cerebral artery: Secondary | ICD-10-CM

## 2017-03-27 MED FILL — GABAPENTIN 100 MG CAPSULE: 100 | 90 days supply | Qty: 270 | Fill #0

## 2017-04-08 MED FILL — LISINOPRIL 10 MG TABLET: 10 | 30 days supply | Qty: 60 | Fill #1

## 2017-05-08 ENCOUNTER — Other Ambulatory Visit: Payer: Self-pay | Admitting: Primary Care

## 2017-05-08 DIAGNOSIS — I10 Essential (primary) hypertension: Secondary | ICD-10-CM

## 2017-05-08 MED FILL — LISINOPRIL 10 MG TABLET: 10 | 30 days supply | Qty: 60 | Fill #0

## 2017-06-06 ENCOUNTER — Other Ambulatory Visit: Payer: Self-pay | Admitting: *Deleted

## 2017-06-06 DIAGNOSIS — R7303 Prediabetes: Secondary | ICD-10-CM

## 2017-06-06 MED FILL — LISINOPRIL 10 MG TABLET: 10 | 30 days supply | Qty: 60 | Fill #1

## 2017-06-12 ENCOUNTER — Other Ambulatory Visit: Payer: PPO

## 2017-06-30 ENCOUNTER — Ambulatory Visit: Payer: PPO | Admitting: Nurse Practitioner

## 2017-07-03 ENCOUNTER — Other Ambulatory Visit: Payer: Self-pay | Admitting: Primary Care

## 2017-07-03 DIAGNOSIS — I10 Essential (primary) hypertension: Secondary | ICD-10-CM

## 2017-07-03 MED FILL — LISINOPRIL 10 MG TABLET: 10 | 30 days supply | Qty: 60 | Fill #0

## 2017-07-07 ENCOUNTER — Telehealth: Payer: Self-pay

## 2017-07-07 MED ORDER — ATORVASTATIN CALCIUM 40 MG PO TABS: ORAL_TABLET | ORAL | 0 refills | Status: DC

## 2017-07-07 NOTE — Telephone Encounter (Signed)
[place] Atorvastatin 40 mg. Take 1 tablet by mouth every evening. #6, no refills.

## 2017-07-07 NOTE — Telephone Encounter (Signed)
Spoken and notified that Rx was [facility]

## 2017-07-07 NOTE — Telephone Encounter (Signed)
Caller Name:maerita Stabenow Relationship to Patient:self Best number: Pharmacy:  Reason for call: pt is wanting to now if medication can be called in. She has been without it for 3 days.

## 2017-07-07 NOTE — Telephone Encounter (Signed)
Pt is in [place] and cannot get her atorvastatin from [facility] and she is out of medication. Asking if we can call in 6 tablets to last until she gets back home. [facility] at [phone]. Please let pt know when done.

## 2017-07-07 NOTE — Telephone Encounter (Signed)
Message left for patient to return my call.  

## 2017-07-14 ENCOUNTER — Ambulatory Visit: Payer: PPO | Admitting: Nurse Practitioner

## 2017-08-01 ENCOUNTER — Other Ambulatory Visit: Payer: Self-pay | Admitting: Primary Care

## 2017-08-01 MED FILL — ATORVASTATIN 40 MG TABLET: 40 | 90 days supply | Qty: 90 | Fill #0

## 2017-08-01 NOTE — Telephone Encounter (Signed)
Patient called and said she only has 2 pills remaining.  [facility] closes today at 5:45 and won't be open the weekend.  Patient said she requested the medication on Wednesday. Patient said she doesn't need to be called when rx is sent in because [facility] will contact her.

## 2017-08-01 NOTE — Telephone Encounter (Signed)
Looks like this was refilled today 

## 2017-08-06 MED FILL — LISINOPRIL 10 MG TABLET: 10 | 30 days supply | Qty: 60 | Fill #1

## 2017-08-29 IMAGING — CT CT HEAD W/O CM
1 series · 16 of 30 positions shown, 20 images · non-contrast
Comparison: Head CT 09/25/2008.

CLINICAL DATA: 64-year-old female with right-sided numbness of the
face and limbs since [DATE] p.m.. Code stroke.

EXAM:
CT HEAD WITHOUT CONTRAST
TECHNIQUE: Contiguous axial images were obtained from the base of the skull
through the vertex without intravenous contrast.

[Series 2: head 5.0 h30s · axial · 0.41mm/px · z∈[-134,+16]mm · 16 of 34 slices shown, 20 images]
[im 2/34  brain]
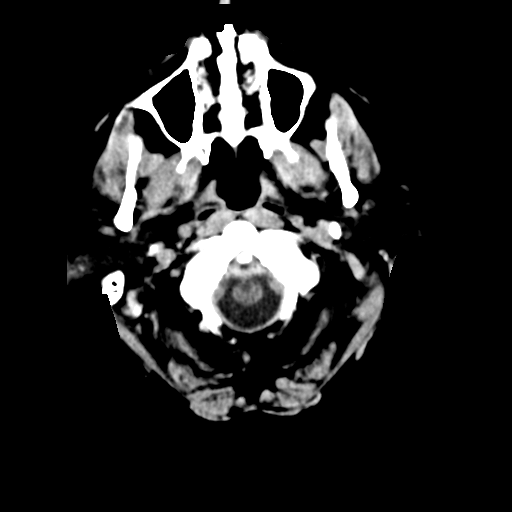
[im 2/34  bone]
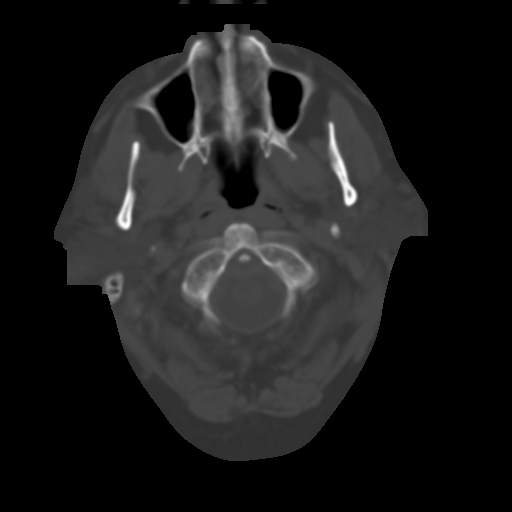
[im 4/34  brain]
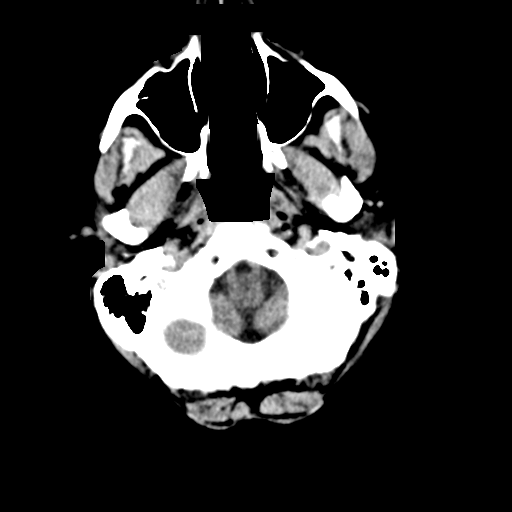
[im 6/34  brain]
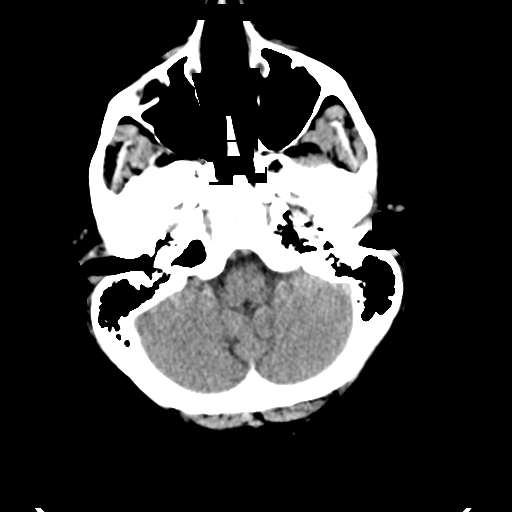
[im 8/34  brain]
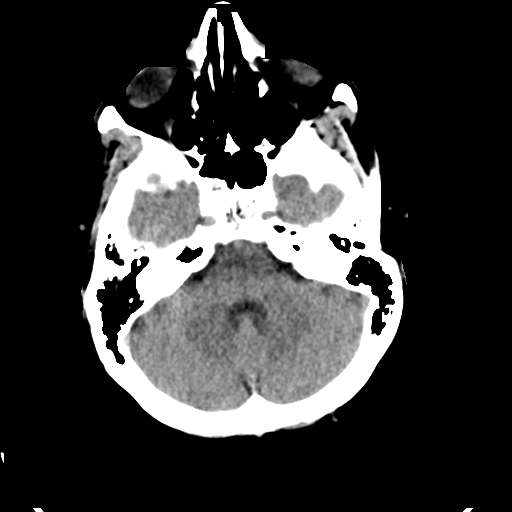
[im 10/34  brain]
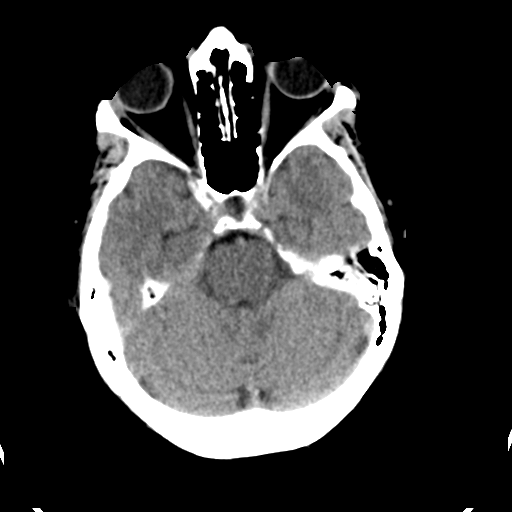
[im 10/34  bone]
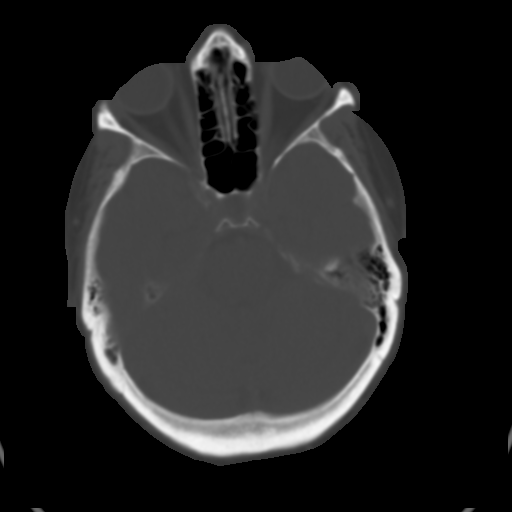
[im 12/34  brain]
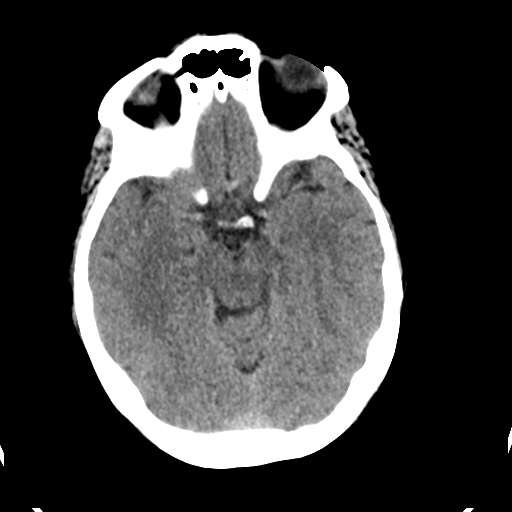
[im 14/34  brain]
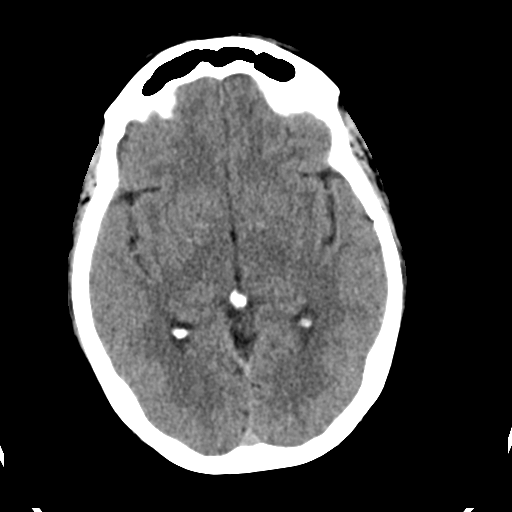
[im 16/34  brain]
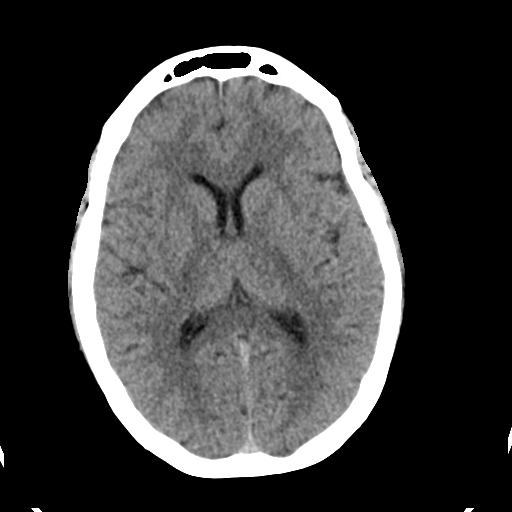
[im 18/34  brain]
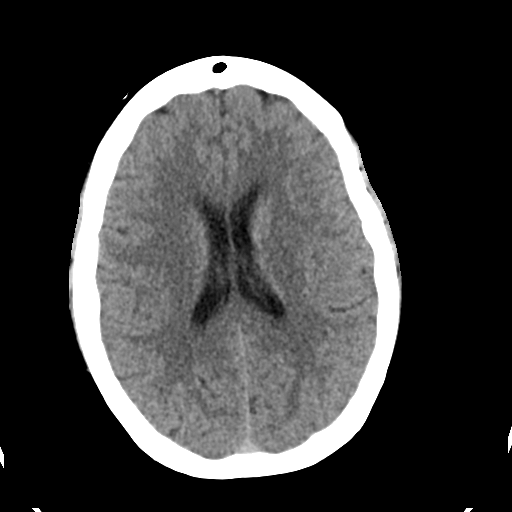
[im 18/34  bone]
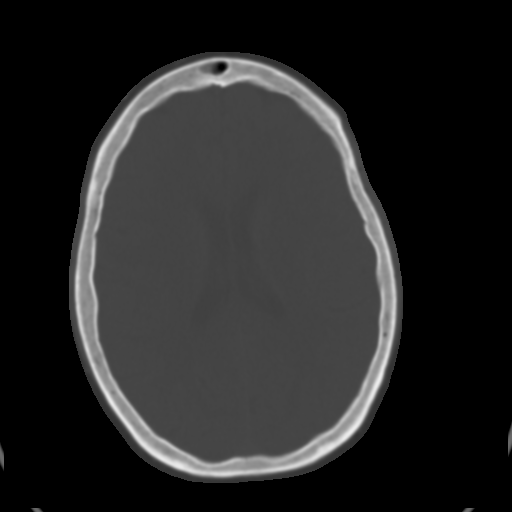
[im 20/34  brain]
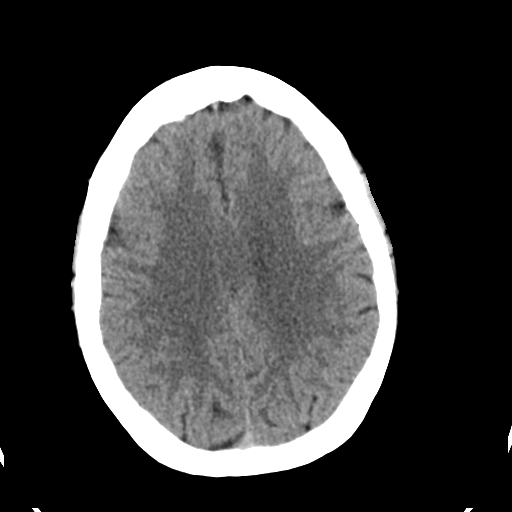
[im 22/34  brain]
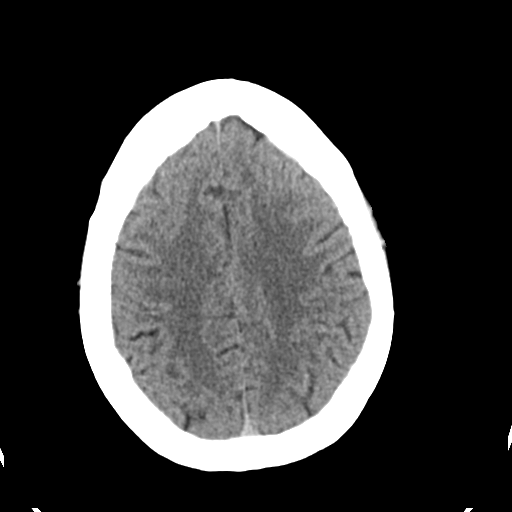
[im 24/34  brain]
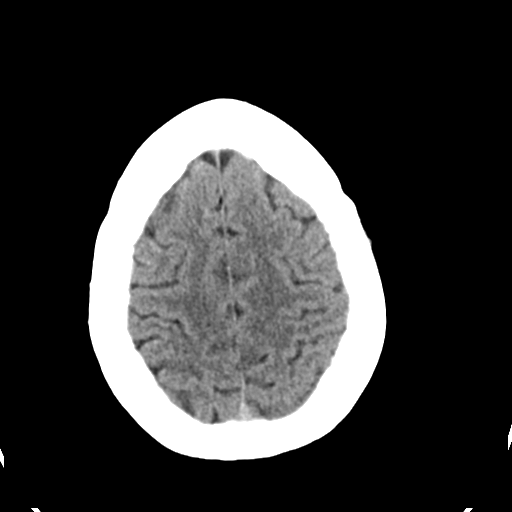
[im 26/34  brain]
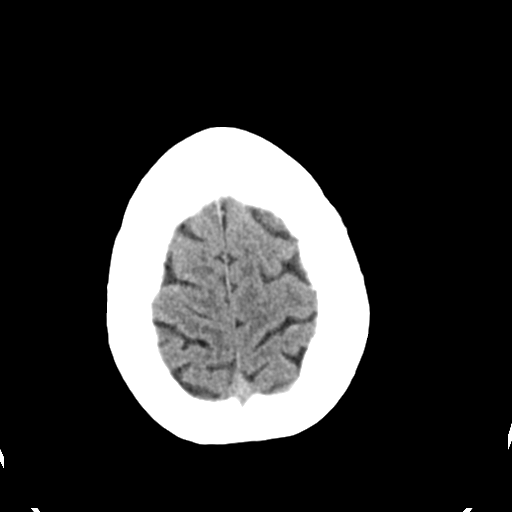
[im 26/34  bone]
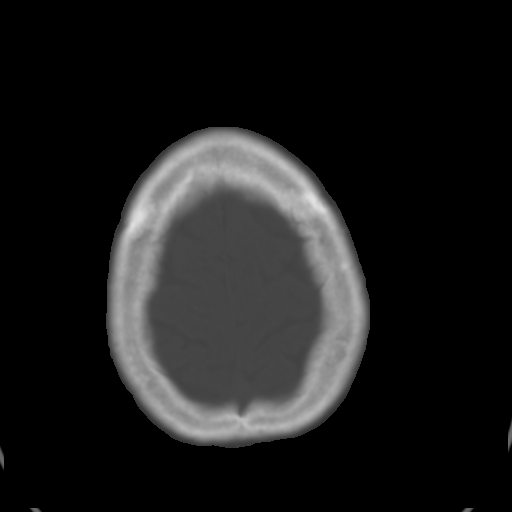
[im 28/34  brain]
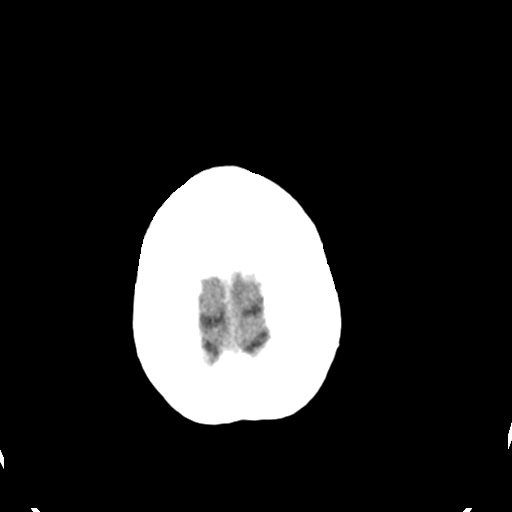
[im 30/34  brain]
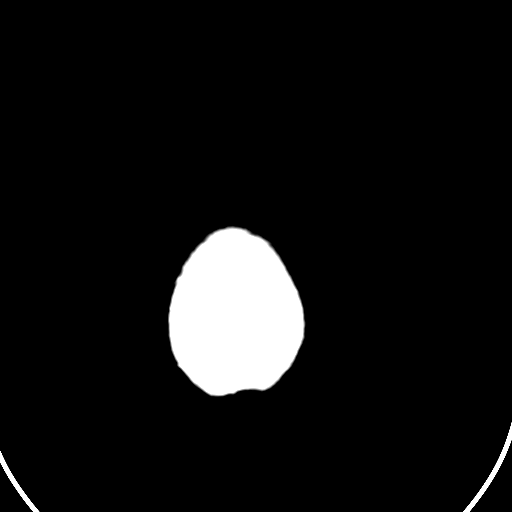
[im 32/34  brain]
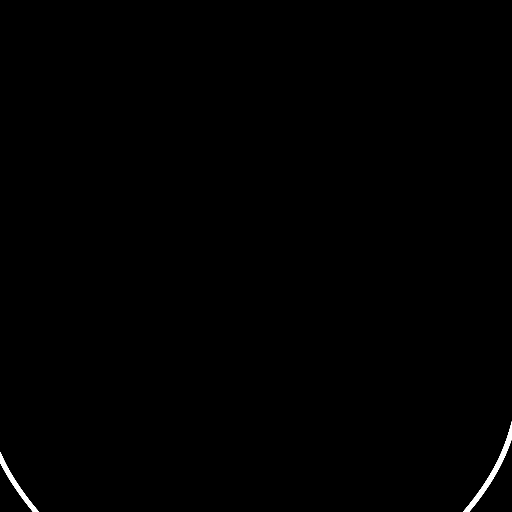

[16 of 30 positions shown; findings below may reference images not displayed]

FINDINGS: No acute intracranial abnormalities. Specifically, no evidence of
acute intracranial hemorrhage, no definite findings of
acute/subacute cerebral ischemia, no mass, mass effect,
hydrocephalus or abnormal intra or extra-axial fluid collections.
Visualized paranasal sinuses and mastoids are well pneumatized. No
acute displaced skull fractures are identified.
IMPRESSION: *No acute intracranial abnormalities.
*The appearance of the brain is normal.
These results were called by telephone at the time of interpretation
on 11/24/2015 at [DATE] to Dr. Eraclio, who verbally
acknowledged these results.

## 2017-09-05 ENCOUNTER — Other Ambulatory Visit: Payer: Self-pay | Admitting: Primary Care

## 2017-09-05 DIAGNOSIS — I10 Essential (primary) hypertension: Secondary | ICD-10-CM

## 2017-09-05 MED FILL — LISINOPRIL 10 MG TABS: 10 | 90 days supply | Qty: 180 | Fill #0

## 2017-10-30 MED FILL — ATORVASTATIN 40 MG TABLET: 40 | 90 days supply | Qty: 90 | Fill #1

## 2017-12-04 MED FILL — LISINOPRIL 10 MG TABS: 10 | 90 days supply | Qty: 180 | Fill #1

## 2018-01-28 MED FILL — ATORVASTATIN 40 MG TABLET: 40 | 90 days supply | Qty: 90 | Fill #0

## 2018-03-04 ENCOUNTER — Other Ambulatory Visit: Payer: Self-pay | Admitting: Primary Care

## 2018-03-04 DIAGNOSIS — I10 Essential (primary) hypertension: Secondary | ICD-10-CM

## 2018-03-04 MED FILL — LISINOPRIL 10 MG TABLET: 10 | 90 days supply | Qty: 180 | Fill #0

## 2018-04-09 ENCOUNTER — Other Ambulatory Visit: Payer: Self-pay | Admitting: Neurology

## 2018-04-09 DIAGNOSIS — I63332 Cerebral infarction due to thrombosis of left posterior cerebral artery: Secondary | ICD-10-CM

## 2018-04-10 MED FILL — GABAPENTIN 100 MG CAP: 100 | 90 days supply | Qty: 270 | Fill #0

## 2018-04-27 ENCOUNTER — Other Ambulatory Visit: Payer: Self-pay | Admitting: Primary Care

## 2018-04-27 DIAGNOSIS — E785 Hyperlipidemia, unspecified: Secondary | ICD-10-CM

## 2018-04-27 NOTE — Telephone Encounter (Signed)
Please notify patient that she is overdue for CPE with fasting labs, please schedule. I will send a 30 day supply of this medication to her pharmacy, we will need to see her for further refills.

## 2018-04-27 NOTE — Telephone Encounter (Signed)
Ok to refill? Electronically refill request for atorvastatin (LIPITOR) 40 MG tablet  Last prescribed on 08/01/2017. Last seen on 03/13/2017.

## 2018-04-28 MED FILL — ATORVASTATIN 40 MG TABLET: 40 | 30 days supply | Qty: 30 | Fill #0

## 2018-04-28 NOTE — Telephone Encounter (Signed)
Message left for patient to return my call.  

## 2018-04-30 NOTE — Telephone Encounter (Signed)
Message left for patient to return my call.  

## 2018-05-06 NOTE — Telephone Encounter (Signed)
Sent patient a message through My Chart.

## 2018-05-26 ENCOUNTER — Other Ambulatory Visit: Payer: Self-pay | Admitting: Primary Care

## 2018-05-26 DIAGNOSIS — E785 Hyperlipidemia, unspecified: Secondary | ICD-10-CM

## 2018-05-26 MED FILL — ATORVASTATIN 40 MG TABLET: 40 | 90 days supply | Qty: 90 | Fill #0

## 2018-06-02 MED FILL — LISINOPRIL 10 MG TABLET: 10 | 90 days supply | Qty: 180 | Fill #1

## 2018-08-04 ENCOUNTER — Ambulatory Visit (INDEPENDENT_AMBULATORY_CARE_PROVIDER_SITE_OTHER): Payer: PPO | Admitting: Primary Care

## 2018-08-04 ENCOUNTER — Encounter: Payer: Self-pay | Admitting: Primary Care

## 2018-08-04 ENCOUNTER — Other Ambulatory Visit: Payer: Self-pay | Admitting: Primary Care

## 2018-08-04 VITALS — BP 160/100 | HR 78 | Temp 98.3°F | Ht 67.0 in | Wt 179.5 lb

## 2018-08-04 DIAGNOSIS — Z23 Encounter for immunization: Secondary | ICD-10-CM

## 2018-08-04 DIAGNOSIS — Z0000 Encounter for general adult medical examination without abnormal findings: Secondary | ICD-10-CM | POA: Diagnosis not present

## 2018-08-04 DIAGNOSIS — I10 Essential (primary) hypertension: Secondary | ICD-10-CM

## 2018-08-04 DIAGNOSIS — E2839 Other primary ovarian failure: Secondary | ICD-10-CM

## 2018-08-04 DIAGNOSIS — R7303 Prediabetes: Secondary | ICD-10-CM

## 2018-08-04 DIAGNOSIS — Z1239 Encounter for other screening for malignant neoplasm of breast: Secondary | ICD-10-CM

## 2018-08-04 DIAGNOSIS — I63332 Cerebral infarction due to thrombosis of left posterior cerebral artery: Secondary | ICD-10-CM

## 2018-08-04 DIAGNOSIS — Z1231 Encounter for screening mammogram for malignant neoplasm of breast: Secondary | ICD-10-CM | POA: Diagnosis not present

## 2018-08-04 DIAGNOSIS — E785 Hyperlipidemia, unspecified: Secondary | ICD-10-CM

## 2018-08-04 LAB — LIPID PANEL
CHOL/HDL RATIO: 3
CHOLESTEROL: 121 mg/dL (ref 0–200)
HDL: 39.8 mg/dL (ref >39.00)
LDL CALC: 63 mg/dL (ref 0–99)
NonHDL: 81.42
Triglycerides: 93 mg/dL (ref 0.0–149.0)
VLDL: 18.6 mg/dL (ref 0.0–40.0)

## 2018-08-04 LAB — COMPREHENSIVE METABOLIC PANEL
ALT: 15 U/L (ref 0–35)
AST: 17 U/L (ref 0–37)
Albumin: 4.4 g/dL (ref 3.5–5.2)
Alkaline Phosphatase: 64 U/L (ref 39–117)
BUN: 15 mg/dL (ref 6–23)
CALCIUM: 9.8 mg/dL (ref 8.4–10.5)
CHLORIDE: 105 meq/L (ref 96–112)
CO2: 29 mEq/L (ref 19–32)
Creatinine, Ser: 0.71 mg/dL (ref 0.40–1.20)
GFR: 87.22 mL/min (ref >60.00)
Glucose, Bld: 119 mg/dL — ABNORMAL HIGH (ref 70–99)
POTASSIUM: 4.7 meq/L (ref 3.5–5.1)
SODIUM: 140 meq/L (ref 135–145)
Total Bilirubin: 0.6 mg/dL (ref 0.2–1.2)
Total Protein: 7.4 g/dL (ref 6.0–8.3)

## 2018-08-04 LAB — HEMOGLOBIN A1C: HEMOGLOBIN A1C: 6.4 % (ref 4.6–6.5)

## 2018-08-04 MED ORDER — ZOSTER VAC RECOMB ADJUVANTED 50 MCG/0.5ML IM SUSR: 0.5000 mL | Freq: Once | INTRAMUSCULAR | 1 refills | Status: AC

## 2018-08-04 NOTE — Assessment & Plan Note (Signed)
Repeat lipids pending, continue atorvastatin 40 mg. Recommended to continue regular exercise, work on diet.

## 2018-08-04 NOTE — Assessment & Plan Note (Signed)
Above goal in the office today, home readings are stable.  Continue lisinopril 10 mg BID. BMP pending.

## 2018-08-04 NOTE — Progress Notes (Signed)
Subjective:    Patient ID: Veronica Andrade, female    DOB: 05-Feb-1951, 67 y.o.   MRN: [id number]  HPI  Veronica Andrade is a 67 year old female who presents today for complete physical.  BP Readings from Last 3 Encounters:  08/04/18 (!) 160/100  03/13/17 (!) 148/94  02/07/17 (!) 168/102   She's checking her BP at home which is running 110's-120's/70's-80's.   Immunizations: -Influenza: Due -Pneumonia: Completed Prevnar in 2018, due for pneumovax -Shingles: Never completed   Diet: She endorses a fair diet. Breakfast: [place] bacon, sausage  Lunch: Fruit, peanut butter Dinner: Restaurants, meat, vegetables, starch Snacks: Ice cream sandwiches, ice cream.  Desserts: 3 times weekly  Beverages: Water, diet soda  Exercise: She is walking 2 miles 4-5 times weekly  Eye exam: Completed in January 2019 Dental exam: Completes annually  Colonoscopy: Never completed. Opts for Cologuard Dexa: Never completed Mammogram: Completed in 2017, due.  Hep C Screen: Negative in 2018  Wt Readings from Last 3 Encounters:  08/04/18 179 lb 8 oz (81.4 kg)  03/13/17 181 lb 6.4 oz (82.3 kg)  02/07/17 182 lb (82.6 kg)     Review of Systems  Constitutional: Negative for unexpected weight change.  HENT: Negative for rhinorrhea.   Respiratory: Negative for cough and shortness of breath.   Cardiovascular: Negative for chest pain.  Gastrointestinal: Negative for constipation and diarrhea.  Genitourinary: Negative for difficulty urinating and menstrual problem.  Musculoskeletal: Negative for arthralgias and myalgias.  Skin: Negative for rash.  Allergic/Immunologic: Negative for environmental allergies.  Neurological: Positive for numbness. Negative for dizziness and headaches.       Residual numbness from CVA       Past Medical History:  Diagnosis Date  . Hyperlipidemia   . Hypertension   . Stroke [facility])      Social History   Socioeconomic History  . Marital status: Married    Spouse name:  Not on file  . Number of children: Not on file  . Years of education: Not on file  . Highest education level: Not on file  Occupational History  . Not on file  Social Needs  . Financial resource strain: Not on file  . Food insecurity:    Worry: Not on file    Inability: Not on file  . Transportation needs:    Medical: Not on file    Non-medical: Not on file  Tobacco Use  . Smoking status: Never Smoker  . Smokeless tobacco: Never Used  Substance and Sexual Activity  . Alcohol use: No    Alcohol/week: 0.0 standard drinks  . Drug use: No  . Sexual activity: Not on file  Lifestyle  . Physical activity:    Days per week: Not on file    Minutes per session: Not on file  . Stress: Not on file  Relationships  . Social connections:    Talks on phone: Not on file    Gets together: Not on file    Attends religious service: Not on file    Active member of club or organization: Not on file    Attends meetings of clubs or organizations: Not on file    Relationship status: Not on file  . Intimate partner violence:    Fear of current or ex partner: Not on file    Emotionally abused: Not on file    Physically abused: Not on file    Forced sexual activity: Not on file  Other Topics Concern  .  Not on file  Social History Narrative   Married.   2 children, 4 g.children   Works at [employer], Chief of Staff.     Enjoys spending time in [place], camping, spending time with family.    Past Surgical History:  Procedure Laterality Date  . BLADDER SURGERY     Bladder Tack  . KNEE ARTHROSCOPY Left   . TUBAL LIGATION      Family History  Problem Relation Age of Onset  . Stroke Father   . Diabetes Father   . Hypertension Father   . Arrhythmia Mother   . Hypertension Mother   . Hypertension Brother     Allergies  Allergen Reactions  . Codeine Nausea Only and Other (See Comments)    Headaches also  . Morphine Nausea Only and Other (See Comments)    Headaches also  .  Hydrochlorothiazide Nausea And Vomiting    Current Outpatient Medications on File Prior to Visit  Medication Sig Dispense Refill  . aspirin 325 MG tablet Take 1 tablet (325 mg total) by mouth daily. 60 tablet 0  . atorvastatin (LIPITOR) 40 MG tablet TAKE 1 TABLET BY MOUTH EVERY EVENING FOR CHOLESTEROL 90 tablet 0  . cholecalciferol (VITAMIN D) 1000 units tablet Take 1,000 Units by mouth daily.    Marland Kitchen gabapentin (NEURONTIN) 100 MG capsule TAKE 1 CAPSULE BY MOUTH 3 TIMES DAILY. 270 capsule 0  . lisinopril (PRINIVIL,ZESTRIL) 10 MG tablet TAKE 1 TABLET (10 MG TOTAL) BY MOUTH TWICE A DAY 180 tablet 1  . Multiple Vitamins-Minerals (MULTIVITAMIN GUMMIES ADULTS) CHEW Chew 2 each by mouth daily.     No current facility-administered medications on file prior to visit.     BP (!) 160/100   Pulse 78   Temp 98.3 F (36.8 C) (Oral)   Ht 5\' 7"  (1.702 m)   Wt 179 lb 8 oz (81.4 kg)   SpO2 97%   BMI 28.11 kg/m    Objective:   Physical Exam  Constitutional: She is oriented to person, place, and time. She appears well-nourished.  HENT:  Mouth/Throat: No oropharyngeal exudate.  Eyes: Pupils are equal, round, and reactive to light. EOM are normal.  Neck: Neck supple. No thyromegaly present.  Cardiovascular: Normal rate and regular rhythm.  Respiratory: Effort normal and breath sounds normal.  GI: Soft. Bowel sounds are normal. There is no tenderness.  Musculoskeletal: Normal range of motion.  Mild weakness to right upper extremity, residual from CVA  Neurological: She is alert and oriented to person, place, and time.  Skin: Skin is warm and dry.  Psychiatric: She has a normal mood and affect.           Assessment & Plan:

## 2018-08-04 NOTE — Patient Instructions (Signed)
Stop by the lab prior to leaving today. I will notify you of your results once received.   Continue exercising. You should be getting 150 minutes of moderate intensity exercise weekly.  It's important to improve your diet by reducing consumption of fast food, fried food, processed snack foods, sugary drinks. Increase consumption of fresh vegetables and fruits, whole grains, water.  Ensure you are drinking 64 ounces of water daily.  You were provided with flu and pneumonia vaccinations.   [facility] to schedule your bone density scan and mammogram.  Take the shingles vaccination to your pharmacy.   Schedule a Medicare Wellness Visit with our nurse.   Follow up in 1 year for your annual exam or sooner if needed.  It was a pleasure to see you today!   Preventive Care 67 Years and Older, Female Preventive care refers to lifestyle choices and visits with your health care provider that can promote health and wellness. What does preventive care include?  A yearly physical exam. This is also called an annual well check.  Dental exams once or twice a year.  Routine eye exams. Ask your health care provider how often you should have your eyes checked.  Personal lifestyle choices, including: ? Daily care of your teeth and gums. ? Regular physical activity. ? Eating a healthy diet. ? Avoiding tobacco and drug use. ? Limiting alcohol use. ? Practicing safe sex. ? Taking low-dose aspirin every day. ? Taking vitamin and mineral supplements as recommended by your health care provider. What happens during an annual well check? The services and screenings done by your health care provider during your annual well check will depend on your age, overall health, lifestyle risk factors, and family history of disease. Counseling Your health care provider may ask you questions about your:  Alcohol use.  Tobacco use.  Drug use.  Emotional well-being.  Home and relationship  well-being.  Sexual activity.  Eating habits.  History of falls.  Memory and ability to understand (cognition).  Work and work Statistician.  Reproductive health.  Screening You may have the following tests or measurements:  Height, weight, and BMI.  Blood pressure.  Lipid and cholesterol levels. These may be checked every 5 years, or more frequently if you are over 39 years old.  Skin check.  Lung cancer screening. You may have this screening every year starting at age 26 if you have a 30-pack-year history of smoking and currently smoke or have quit within the past 15 years.  Fecal occult blood test (FOBT) of the stool. You may have this test every year starting at age 35.  Flexible sigmoidoscopy or colonoscopy. You may have a sigmoidoscopy every 5 years or a colonoscopy every 10 years starting at age 4.  Hepatitis C blood test.  Hepatitis B blood test.  Sexually transmitted disease (STD) testing.  Diabetes screening. This is done by checking your blood sugar (glucose) after you have not eaten for a while (fasting). You may have this done every 1-3 years.  Bone density scan. This is done to screen for osteoporosis. You may have this done starting at age 38.  Mammogram. This may be done every 1-2 years. Talk to your health care provider about how often you should have regular mammograms.  Talk with your health care provider about your test results, treatment options, and if necessary, the need for more tests. Vaccines Your health care provider may recommend certain vaccines, such as:  Influenza vaccine. This is recommended  every year.  Tetanus, diphtheria, and acellular pertussis (Tdap, Td) vaccine. You may need a Td booster every 10 years.  Varicella vaccine. You may need this if you have not been vaccinated.  Zoster vaccine. You may need this after age 29.  Measles, mumps, and rubella (MMR) vaccine. You may need at least one dose of MMR if you were born in  1957 or later. You may also need a second dose.  Pneumococcal 13-valent conjugate (PCV13) vaccine. One dose is recommended after age 50.  Pneumococcal polysaccharide (PPSV23) vaccine. One dose is recommended after age 20.  Meningococcal vaccine. You may need this if you have certain conditions.  Hepatitis A vaccine. You may need this if you have certain conditions or if you travel or work in places where you may be exposed to hepatitis A.  Hepatitis B vaccine. You may need this if you have certain conditions or if you travel or work in places where you may be exposed to hepatitis B.  Haemophilus influenzae type b (Hib) vaccine. You may need this if you have certain conditions.  Talk to your health care provider about which screenings and vaccines you need and how often you need them. This information is not intended to replace advice given to you by your health care provider. Make sure you discuss any questions you have with your health care provider. Document Released: 12/08/2015 Document Revised: 07/31/2016 Document Reviewed: 09/12/2015 Elsevier Interactive Patient Education  [employer].

## 2018-08-04 NOTE — Assessment & Plan Note (Signed)
Pneumovax 23 due and provided. Influenza due and provided. Rx for Shingrix printed. Mammogram and bone density due, orders placed. Colon Cancer screening overdue, she opts for Cologuard. Recommended to work on diet, continue regular exercise. Exam unremarkable. Labs pending. Follow up in 1 year for CPE. She will schedule MWV with our nurse.

## 2018-08-04 NOTE — Addendum Note (Signed)
Addended by: Tawnya Crook on: 08/04/2018 01:00 PM   Modules accepted: Orders

## 2018-08-04 NOTE — Assessment & Plan Note (Signed)
Recommended to work on diet, continue exercise. Repeat A1C pending today.

## 2018-08-04 NOTE — Assessment & Plan Note (Addendum)
Overall doing well, occasional use of gabapentin with increased labor. Continues to have mild numbness to right face, upper and lower extremities. Ambulates well except for numerous steps.

## 2018-08-05 ENCOUNTER — Ambulatory Visit (INDEPENDENT_AMBULATORY_CARE_PROVIDER_SITE_OTHER): Payer: PPO

## 2018-08-05 VITALS — BP 150/104 | HR 72 | Temp 98.7°F | Ht 65.0 in | Wt 180.5 lb

## 2018-08-05 DIAGNOSIS — Z0000 Encounter for general adult medical examination without abnormal findings: Secondary | ICD-10-CM | POA: Insufficient documentation

## 2018-08-05 NOTE — Progress Notes (Signed)
PCP notes:   Health maintenance:  No gaps identified that were not addressed at CPE.  Abnormal screenings:   Hearing - failed  Hearing Screening   125Hz  250Hz  500Hz  1000Hz  2000Hz  3000Hz  4000Hz  6000Hz  8000Hz   Right ear:   40 40 40  0    Left ear:   40 40 40  40    Vision Screening Comments: Vision exam in Jan 2019 @ [facility]  Patient concerns:   None  Nurse concerns:  None  Next PCP appt:   N/A; CPE on 08/04/18

## 2018-08-05 NOTE — Progress Notes (Signed)
Subjective:   Veronica Andrade is a 67 y.o. female who presents for an Initial Medicare Annual Wellness Visit.  Review of Systems    N/A  Cardiac Risk Factors include: advanced age (>56men, >14 women);hypertension;obesity (BMI >30kg/m2)     Objective:    Today's Vitals   08/05/18 1058  BP: (!) 150/104  Pulse: 72  Temp: 98.7 F (37.1 C)  TempSrc: Oral  SpO2: 97%  Weight: 180 lb 8 oz (81.9 kg)  Height: 5\' 5"  (1.651 m)  PainSc: 0-No pain   Body mass index is 30.04 kg/m.  Advanced Directives 08/05/2018 12/05/2015 11/25/2015  Does Patient Have a Medical Advance Directive? Yes Yes No  Type of Advance Directive Living will Living will -  Copy of Healthcare Power of Attorney in Chart? - No - copy requested -  Would patient like information on creating a medical advance directive? - - No - patient declined information    Current Medications (verified) Outpatient Encounter Medications as of 08/05/2018  Medication Sig  . aspirin 325 MG tablet Take 1 tablet (325 mg total) by mouth daily.  Marland Kitchen atorvastatin (LIPITOR) 40 MG tablet TAKE 1 TABLET BY MOUTH EVERY EVENING FOR CHOLESTEROL  . cholecalciferol (VITAMIN D) 1000 units tablet Take 1,000 Units by mouth daily.  Marland Kitchen gabapentin (NEURONTIN) 100 MG capsule TAKE 1 CAPSULE BY MOUTH 3 TIMES DAILY.  Marland Kitchen lisinopril (PRINIVIL,ZESTRIL) 10 MG tablet TAKE 1 TABLET (10 MG TOTAL) BY MOUTH TWICE A DAY  . Multiple Vitamins-Minerals (MULTIVITAMIN GUMMIES ADULTS) CHEW Chew 2 each by mouth daily.   No facility-administered encounter medications on file as of 08/05/2018.     Allergies (verified) Codeine; Morphine; and Hydrochlorothiazide   History: Past Medical History:  Diagnosis Date  . Hyperlipidemia   . Hypertension   . Stroke [facility])    Past Surgical History:  Procedure Laterality Date  . BLADDER SURGERY     Bladder Tack  . KNEE ARTHROSCOPY Left   . TUBAL LIGATION     Family History  Problem Relation Age of Onset  . Stroke Father   .  Diabetes Father   . Hypertension Father   . Arrhythmia Mother   . Hypertension Mother   . Hypertension Brother    Social History   Socioeconomic History  . Marital status: Married    Spouse name: Not on file  . Number of children: Not on file  . Years of education: Not on file  . Highest education level: Not on file  Occupational History  . Not on file  Social Needs  . Financial resource strain: Not on file  . Food insecurity:    Worry: Not on file    Inability: Not on file  . Transportation needs:    Medical: Not on file    Non-medical: Not on file  Tobacco Use  . Smoking status: Never Smoker  . Smokeless tobacco: Never Used  Substance and Sexual Activity  . Alcohol use: No    Alcohol/week: 0.0 standard drinks  . Drug use: No  . Sexual activity: Not on file  Lifestyle  . Physical activity:    Days per week: Not on file    Minutes per session: Not on file  . Stress: Not on file  Relationships  . Social connections:    Talks on phone: Not on file    Gets together: Not on file    Attends religious service: Not on file    Active member of club or organization: Not on file  Attends meetings of clubs or organizations: Not on file    Relationship status: Not on file  Other Topics Concern  . Not on file  Social History Narrative   Married.   2 children, 4 g.children   Works at [employer], Chief of Staff.     Enjoys spending time in [place], camping, spending time with family.    Tobacco Counseling Counseling given: No   Clinical Intake:  Pre-visit preparation completed: Yes  Pain : No/denies pain Pain Score: 0-No pain     Nutritional Status: BMI > 30  Obese Nutritional Risks: None Diabetes: No  How often do you need to have someone help you when you read instructions, pamphlets, or other written materials from your doctor or pharmacy?: 1 - Never What is the last grade level you completed in school?: 12th grade + 1 yr college  Interpreter Needed?:  No  Comments: pt lives with spouse Information entered by :: LPinson, LPN   Activities of Daily Living In your present state of health, do you have any difficulty performing the following activities: 08/05/2018  Hearing? N  Vision? N  Difficulty concentrating or making decisions? N  Walking or climbing stairs? N  Dressing or bathing? N  Doing errands, shopping? N  Preparing Food and eating ? N  Using the Toilet? N  In the past six months, have you accidently leaked urine? Y  Do you have problems with loss of bowel control? N  Managing your Medications? N  Managing your Finances? N  Housekeeping or managing your Housekeeping? N  Some recent data might be hidden     Immunizations and Health Maintenance Immunization History  Administered Date(s) Administered  . Influenza,inj,Quad PF,6+ Mos 08/04/2018  . Pneumococcal Conjugate-13 03/13/2017  . Pneumococcal Polysaccharide-23 08/04/2018  . Td 11/24/2015   There are no preventive care reminders to display for this patient.  Patient Care Team: Doreene Nest, NP as PCP - General (Internal Medicine)    Assessment:   This is a routine wellness examination for Veronica Andrade.  Hearing/Vision screen  Hearing Screening   125Hz  250Hz  500Hz  1000Hz  2000Hz  3000Hz  4000Hz  6000Hz  8000Hz   Right ear:   40 40 40  0    Left ear:   40 40 40  40    Vision Screening Comments: Vision exam in Jan 2019 @ [facility]  Dietary issues and exercise activities discussed: Current Exercise Habits: Home exercise routine, Type of exercise: walking, Time (Minutes): 30(2 miles), Frequency (Times/Week): 5, Weekly Exercise (Minutes/Week): 150, Intensity: Mild, Exercise limited by: None identified  Goals    . Patient Stated     Starting 08/05/2018, I will continue to take medications as prescribed.       Depression Screen PHQ 2/9 Scores 08/05/2018 08/04/2018 02/07/2017  PHQ - 2 Score 0 0 0  PHQ- 9 Score 0 - -    Fall Risk Fall Risk  08/05/2018  08/04/2018 02/07/2017 12/18/2016 04/23/2016  Falls in the past year? No No No Yes No  Number falls in past yr: - - - 1 -  Injury with Fall? - - - No -    Cognitive Function: MMSE - Mini Mental State Exam 08/05/2018  Orientation to time 5  Orientation to Place 5  Registration 3  Attention/ Calculation 0  Recall 3  Language- name 2 objects 0  Language- repeat 1  Language- follow 3 step command 3  Language- read & follow direction 0  Write a sentence 0  Copy design 0  Total  score 20       PLEASE NOTE: A Mini-Cog screen was completed. Maximum score is 20. A value of 0 denotes this part of Folstein MMSE was not completed or the patient failed this part of the Mini-Cog screening.   Mini-Cog Screening Orientation to Time - Max 5 pts Orientation to Place - Max 5 pts Registration - Max 3 pts Recall - Max 3 pts Language Repeat - Max 1 pts Language Follow 3 Step Command - Max 3 pts   Screening Tests Health Maintenance  Topic Date Due  . DEXA SCAN  08/06/2019 (Originally 05/28/2016)  . Fecal DNA (Cologuard)  08/06/2019 (Originally 05/28/2001)  . MAMMOGRAM  09/30/2018  . TETANUS/TDAP  11/23/2025  . INFLUENZA VACCINE  Completed  . Hepatitis C Screening  Completed  . PNA vac Low Risk Adult  Completed        Plan:   I have personally reviewed, addressed, and noted the following in the patient's chart:  A. Medical and social history B. Use of alcohol, tobacco or illicit drugs  C. Current medications and supplements D. Functional ability and status E.  Nutritional status F.  Physical activity G. Advance directives H. List of other physicians I.  Hospitalizations, surgeries, and ER visits in previous 12 months J.  Vitals K. Screenings to include hearing, vision, cognitive, depression L. Referrals and appointments - none  In addition, I have reviewed and discussed with patient certain preventive protocols, quality metrics, and best practice recommendations. A written personalized  care plan for preventive services as well as general preventive health recommendations were provided to patient.  See attached scanned questionnaire for additional information.   Signed,   Randa Evens, MHA, BS, LPN Health Coach

## 2018-08-05 NOTE — Patient Instructions (Signed)
Veronica Andrade , Thank you for taking time to come for your Medicare Wellness Visit. I appreciate your ongoing commitment to your health goals. Please review the following plan we discussed and let me know if I can assist you in the future.   These are the goals we discussed: Goals    . Patient Stated     Starting 08/05/2018, I will continue to take medications as prescribed.        This is a list of the screening recommended for you and due dates:  Health Maintenance  Topic Date Due  . DEXA scan (bone density measurement)  08/06/2019*  . Cologuard (Stool DNA test)  08/06/2019*  . Mammogram  09/30/2018  . Tetanus Vaccine  11/23/2025  . Flu Shot  Completed  .  Hepatitis C: One time screening is recommended by Center for Disease Control  (CDC) for  adults born from [birth year] through 1965.   Completed  . Pneumonia vaccines  Completed  *Topic was postponed. The date shown is not the original due date.   Preventive Care for Adults  A healthy lifestyle and preventive care can promote health and wellness. Preventive health guidelines for adults include the following key practices.  . A routine yearly physical is a good way to check with your health care provider about your health and preventive screening. It is a chance to share any concerns and updates on your health and to receive a thorough exam.  . Visit your dentist for a routine exam and preventive care every 6 months. Brush your teeth twice a day and floss once a day. Good oral hygiene prevents tooth decay and gum disease.  . The frequency of eye exams is based on your age, health, family medical history, use  of contact lenses, and other factors. Follow your health care provider's recommendations for frequency of eye exams.  . Eat a healthy diet. Foods like vegetables, fruits, whole grains, low-fat dairy products, and lean protein foods contain the nutrients you need without too many calories. Decrease your intake of foods high in solid  fats, added sugars, and salt. Eat the right amount of calories for you. Get information about a proper diet from your health care provider, if necessary.  . Regular physical exercise is one of the most important things you can do for your health. Most adults should get at least 150 minutes of moderate-intensity exercise (any activity that increases your heart rate and causes you to sweat) each week. In addition, most adults need muscle-strengthening exercises on 2 or more days a week.  Silver Sneakers may be a benefit available to you. To determine eligibility, you may visit the website: www.silversneakers.com or contact program at (424) 653-8476 Mon-Fri between 8AM-8PM.   . Maintain a healthy weight. The body mass index (BMI) is a screening tool to identify possible weight problems. It provides an estimate of body fat based on height and weight. Your health care provider can find your BMI and can help you achieve or maintain a healthy weight.   For adults 20 years and older: ? A BMI below 18.5 is considered underweight. ? A BMI of 18.5 to 24.9 is normal. ? A BMI of 25 to 29.9 is considered overweight. ? A BMI of 30 and above is considered obese.   . Maintain normal blood lipids and cholesterol levels by exercising and minimizing your intake of saturated fat. Eat a balanced diet with plenty of fruit and vegetables. Blood tests for lipids and cholesterol should begin  at age 21 and be repeated every 5 years. If your lipid or cholesterol levels are high, you are over 50, or you are at high risk for heart disease, you may need your cholesterol levels checked more frequently. Ongoing high lipid and cholesterol levels should be treated with medicines if diet and exercise are not working.  . If you smoke, find out from your health care provider how to quit. If you do not use tobacco, please do not start.  . If you choose to drink alcohol, please do not consume more than 2 drinks per day. One drink is  considered to be 12 ounces (355 mL) of beer, 5 ounces (148 mL) of wine, or 1.5 ounces (44 mL) of liquor.  . If you are 78-55 years old, ask your health care provider if you should take aspirin to prevent strokes.  . Use sunscreen. Apply sunscreen liberally and repeatedly throughout the day. You should seek shade when your shadow is shorter than you. Protect yourself by wearing long sleeves, pants, a wide-brimmed hat, and sunglasses year round, whenever you are outdoors.  . Once a month, do a whole body skin exam, using a mirror to look at the skin on your back. Tell your health care provider of new moles, moles that have irregular borders, moles that are larger than a pencil eraser, or moles that have changed in shape or color.

## 2018-08-06 NOTE — Progress Notes (Signed)
I reviewed health advisor's note, was available for consultation, and agree with documentation and plan.  

## 2018-08-26 ENCOUNTER — Other Ambulatory Visit: Payer: Self-pay | Admitting: Primary Care

## 2018-08-26 DIAGNOSIS — E785 Hyperlipidemia, unspecified: Secondary | ICD-10-CM

## 2018-08-27 MED FILL — ATORVASTATIN 40 MG TABLET: 40 | 90 days supply | Qty: 90 | Fill #0

## 2018-08-31 ENCOUNTER — Other Ambulatory Visit: Payer: Self-pay | Admitting: Primary Care

## 2018-08-31 DIAGNOSIS — I10 Essential (primary) hypertension: Secondary | ICD-10-CM

## 2018-08-31 MED FILL — LISINOPRIL 10 MG TABLET: 10 | 90 days supply | Qty: 180 | Fill #0

## 2018-09-16 DIAGNOSIS — Z1212 Encounter for screening for malignant neoplasm of rectum: Secondary | ICD-10-CM | POA: Diagnosis not present

## 2018-09-16 DIAGNOSIS — Z1211 Encounter for screening for malignant neoplasm of colon: Secondary | ICD-10-CM | POA: Diagnosis not present

## 2018-09-23 LAB — COLOGUARD

## 2018-09-24 ENCOUNTER — Telehealth: Payer: Self-pay | Admitting: Primary Care

## 2018-09-24 NOTE — Telephone Encounter (Signed)
Please notify patient that her cologuard report was positive. I recommend she undergo colonoscopy for further evaluation. Is she agreeable?

## 2018-09-25 NOTE — Telephone Encounter (Signed)
Message left for patient to return my call.  

## 2018-09-28 NOTE — Telephone Encounter (Signed)
[facility] called to verify received positive cologuard report. Advised yes and trying to contact pt; [facility] said that was all she needed to verify we received report.

## 2018-09-29 NOTE — Telephone Encounter (Signed)
Noted  

## 2018-09-29 NOTE — Telephone Encounter (Signed)
FYI  Spoken and notified patient of Graylon Gunning comments on 09/28/2018. Patient would like to copy of the report before she makes a decision. Mailed it as requested.

## 2018-11-04 ENCOUNTER — Ambulatory Visit: Payer: PPO

## 2018-11-26 MED FILL — LISINOPRIL 10 MG TABS: 10 | 90 days supply | Qty: 180 | Fill #1

## 2018-11-26 MED FILL — ATORVASTATIN 40 MG TABLET: 40 | 90 days supply | Qty: 90 | Fill #1

## 2018-11-27 ENCOUNTER — Other Ambulatory Visit: Payer: PPO

## 2019-02-22 ENCOUNTER — Other Ambulatory Visit: Payer: Self-pay | Admitting: Primary Care

## 2019-02-22 DIAGNOSIS — E785 Hyperlipidemia, unspecified: Secondary | ICD-10-CM

## 2019-02-22 DIAGNOSIS — I10 Essential (primary) hypertension: Secondary | ICD-10-CM

## 2019-02-22 MED FILL — LISINOPRIL 10 MG TABLET: 10 | 90 days supply | Qty: 180 | Fill #0

## 2019-02-22 MED FILL — ATORVASTATIN 40 MG TABLET: 40 | 90 days supply | Qty: 90 | Fill #0

## 2019-05-24 MED FILL — ATORVASTATIN 40 MG TABLET: 40 | 90 days supply | Qty: 90 | Fill #0

## 2019-05-24 MED FILL — LISINOPRIL 10 MG TABS: 10 | 90 days supply | Qty: 180 | Fill #0

## 2019-08-18 ENCOUNTER — Ambulatory Visit: Payer: PPO

## 2019-08-18 ENCOUNTER — Encounter: Payer: PPO | Admitting: Primary Care

## 2019-08-24 ENCOUNTER — Other Ambulatory Visit: Payer: Self-pay | Admitting: Primary Care

## 2019-08-24 DIAGNOSIS — I10 Essential (primary) hypertension: Secondary | ICD-10-CM

## 2019-08-24 NOTE — Telephone Encounter (Signed)
Patient only has 2 pills left and wants to know if the refill can be expedited.

## 2019-09-06 ENCOUNTER — Other Ambulatory Visit: Payer: Self-pay | Admitting: Primary Care

## 2019-09-06 DIAGNOSIS — E785 Hyperlipidemia, unspecified: Secondary | ICD-10-CM

## 2019-09-07 MED FILL — ATORVASTATIN 40 MG TABLET: 40 | 90 days supply | Qty: 90 | Fill #0

## 2019-09-07 NOTE — Telephone Encounter (Signed)
Patient called.  She only has 1 pill left.

## 2019-09-07 NOTE — Telephone Encounter (Signed)
Noted, refill sent to pharmacy. CPE scheduled for November 2020.

## 2019-10-07 ENCOUNTER — Encounter: Payer: PPO | Admitting: Primary Care

## 2019-10-14 ENCOUNTER — Other Ambulatory Visit: Payer: Self-pay

## 2019-10-14 ENCOUNTER — Ambulatory Visit (INDEPENDENT_AMBULATORY_CARE_PROVIDER_SITE_OTHER): Payer: PPO | Admitting: Primary Care

## 2019-10-14 ENCOUNTER — Encounter: Payer: Self-pay | Admitting: Primary Care

## 2019-10-14 VITALS — BP 148/98 | HR 74 | Temp 97.9°F | Ht 65.0 in | Wt 177.5 lb

## 2019-10-14 DIAGNOSIS — Z1231 Encounter for screening mammogram for malignant neoplasm of breast: Secondary | ICD-10-CM

## 2019-10-14 DIAGNOSIS — Z0000 Encounter for general adult medical examination without abnormal findings: Secondary | ICD-10-CM | POA: Diagnosis not present

## 2019-10-14 DIAGNOSIS — E785 Hyperlipidemia, unspecified: Secondary | ICD-10-CM | POA: Diagnosis not present

## 2019-10-14 DIAGNOSIS — I10 Essential (primary) hypertension: Secondary | ICD-10-CM | POA: Diagnosis not present

## 2019-10-14 DIAGNOSIS — E2839 Other primary ovarian failure: Secondary | ICD-10-CM | POA: Diagnosis not present

## 2019-10-14 DIAGNOSIS — J309 Allergic rhinitis, unspecified: Secondary | ICD-10-CM

## 2019-10-14 DIAGNOSIS — R7303 Prediabetes: Secondary | ICD-10-CM

## 2019-10-14 LAB — LIPID PANEL
Cholesterol: 121 mg/dL (ref 0–200)
HDL: 38.8 mg/dL — ABNORMAL LOW (ref >39.00)
LDL Cholesterol: 67 mg/dL (ref 0–99)
NonHDL: 81.95
Total CHOL/HDL Ratio: 3
Triglycerides: 73 mg/dL (ref 0.0–149.0)
VLDL: 14.6 mg/dL (ref 0.0–40.0)

## 2019-10-14 LAB — COMPREHENSIVE METABOLIC PANEL
ALT: 15 U/L (ref 0–35)
AST: 17 U/L (ref 0–37)
Albumin: 4.6 g/dL (ref 3.5–5.2)
Alkaline Phosphatase: 69 U/L (ref 39–117)
BUN: 13 mg/dL (ref 6–23)
CO2: 28 mEq/L (ref 19–32)
Calcium: 9.5 mg/dL (ref 8.4–10.5)
Chloride: 105 mEq/L (ref 96–112)
Creatinine, Ser: 0.73 mg/dL (ref 0.40–1.20)
GFR: 79.19 mL/min (ref >60.00)
Glucose, Bld: 127 mg/dL — ABNORMAL HIGH (ref 70–99)
Potassium: 4.1 mEq/L (ref 3.5–5.1)
Sodium: 141 mEq/L (ref 135–145)
Total Bilirubin: 0.8 mg/dL (ref 0.2–1.2)
Total Protein: 7.4 g/dL (ref 6.0–8.3)

## 2019-10-14 LAB — CBC
HCT: 40.5 % (ref 36.0–46.0)
Hemoglobin: 13.5 g/dL (ref 12.0–15.0)
MCHC: 33.5 g/dL (ref 30.0–36.0)
MCV: 90.4 fl (ref 78.0–100.0)
Platelets: 185 10*3/uL (ref 150.0–400.0)
RBC: 4.48 Mil/uL (ref 3.87–5.11)
RDW: 13.7 % (ref 11.5–15.5)
WBC: 5.4 10*3/uL (ref 4.0–10.5)

## 2019-10-14 LAB — HEMOGLOBIN A1C: Hgb A1c MFr Bld: 6.4 % (ref 4.6–6.5)

## 2019-10-14 NOTE — Assessment & Plan Note (Signed)
Above goal in the office today, home readings are within normal range. Continue lisinopril. CMP pending.

## 2019-10-14 NOTE — Patient Instructions (Signed)
Stop by the lab prior to leaving today. I will notify you of your results once received.   Continue exercising. You should be getting 150 minutes of moderate intensity exercise weekly.  It's important to improve your diet by reducing consumption of fast food, fried food, processed snack foods, sugary drinks. Increase consumption of fresh vegetables and fruits, whole grains, water.  Ensure you are drinking 64 ounces of water daily.  It was a pleasure to see you today!   Preventive Care 68 Years and Older, Female Preventive care refers to lifestyle choices and visits with your health care provider that can promote health and wellness. This includes:  A yearly physical exam. This is also called an annual well check.  Regular dental and eye exams.  Immunizations.  Screening for certain conditions.  Healthy lifestyle choices, such as diet and exercise. What can I expect for my preventive care visit? Physical exam Your health care provider will check:  Height and weight. These may be used to calculate body mass index (BMI), which is a measurement that tells if you are at a healthy weight.  Heart rate and blood pressure.  Your skin for abnormal spots. Counseling Your health care provider may ask you questions about:  Alcohol, tobacco, and drug use.  Emotional well-being.  Home and relationship well-being.  Sexual activity.  Eating habits.  History of falls.  Memory and ability to understand (cognition).  Work and work Statistician.  Pregnancy and menstrual history. What immunizations do I need?  Influenza (flu) vaccine  This is recommended every year. Tetanus, diphtheria, and pertussis (Tdap) vaccine  You may need a Td booster every 10 years. Varicella (chickenpox) vaccine  You may need this vaccine if you have not already been vaccinated. Zoster (shingles) vaccine  You may need this after age 65. Pneumococcal conjugate (PCV13) vaccine  One dose is  recommended after age 37. Pneumococcal polysaccharide (PPSV23) vaccine  One dose is recommended after age 68. Measles, mumps, and rubella (MMR) vaccine  You may need at least one dose of MMR if you were born in 1957 or later. You may also need a second dose. Meningococcal conjugate (MenACWY) vaccine  You may need this if you have certain conditions. Hepatitis A vaccine  You may need this if you have certain conditions or if you travel or work in places where you may be exposed to hepatitis A. Hepatitis B vaccine  You may need this if you have certain conditions or if you travel or work in places where you may be exposed to hepatitis B. Haemophilus influenzae type b (Hib) vaccine  You may need this if you have certain conditions. You may receive vaccines as individual doses or as more than one vaccine together in one shot (combination vaccines). Talk with your health care provider about the risks and benefits of combination vaccines. What tests do I need? Blood tests  Lipid and cholesterol levels. These may be checked every 5 years, or more frequently depending on your overall health.  Hepatitis C test.  Hepatitis B test. Screening  Lung cancer screening. You may have this screening every year starting at age 68 if you have a 30-pack-year history of smoking and currently smoke or have quit within the past 15 years.  Colorectal cancer screening. All adults should have this screening starting at age 68 and continuing until age 53. Your health care provider may recommend screening at age 58 if you are at increased risk. You will have tests every 1-10 years,  depending on your results and the type of screening test.  Diabetes screening. This is done by checking your blood sugar (glucose) after you have not eaten for a while (fasting). You may have this done every 1-3 years.  Mammogram. This may be done every 1-2 years. Talk with your health care provider about how often you should have  regular mammograms.  BRCA-related cancer screening. This may be done if you have a family history of breast, ovarian, tubal, or peritoneal cancers. Other tests  Sexually transmitted disease (STD) testing.  Bone density scan. This is done to screen for osteoporosis. You may have this done starting at age 68. Follow these instructions at home: Eating and drinking  Eat a diet that includes fresh fruits and vegetables, whole grains, lean protein, and low-fat dairy products. Limit your intake of foods with high amounts of sugar, saturated fats, and salt.  Take vitamin and mineral supplements as recommended by your health care provider.  Do not drink alcohol if your health care provider tells you not to drink.  If you drink alcohol: ? Limit how much you have to 0-1 drink a day. ? Be aware of how much alcohol is in your drink. In the [place] one drink equals one 12 oz bottle of beer (355 mL), one 5 oz glass of wine (148 mL), or one 1 oz glass of hard liquor (44 mL). Lifestyle  Take daily care of your teeth and gums.  Stay active. Exercise for at least 30 minutes on 5 or more days each week.  Do not use any products that contain nicotine or tobacco, such as cigarettes, e-cigarettes, and chewing tobacco. If you need help quitting, ask your health care provider.  If you are sexually active, practice safe sex. Use a condom or other form of protection in order to prevent STIs (sexually transmitted infections).  Talk with your health care provider about taking a low-dose aspirin or statin. What's next?  Go to your health care provider once a year for a well check visit.  Ask your health care provider how often you should have your eyes and teeth checked.  Stay up to date on all vaccines. This information is not intended to replace advice given to you by your health care provider. Make sure you discuss any questions you have with your health care provider. Document Released: 12/08/2015  Document Revised: 11/05/2018 Document Reviewed: 11/05/2018 Elsevier Patient Education  2020 [employer].

## 2019-10-14 NOTE — Assessment & Plan Note (Signed)
Repeat A1C pending.  Discussed the importance of a healthy diet and regular exercise in order for weight loss, and to reduce the risk of any potential medical problems.  

## 2019-10-14 NOTE — Assessment & Plan Note (Signed)
Immunizations UTD, declines Shingrix. Cologuard completed last year, positive. She does have hemorrhoids that bleed on occasion. Patient declines recommendations for colonoscopy. Mammogram and bone density scan due, she will schedule, orders placed. Exam today unremarkable. Labs pending.

## 2019-10-14 NOTE — Progress Notes (Signed)
Subjective:    Patient ID: Veronica Andrade, female    DOB: [birth year], 68 y.o.   MRN: [id number]  HPI  [name] is a 68 year old female who presents today for complete physical.  Immunizations: -Tetanus: Completed in 2016 -Influenza: Completed this season  -Shingles: Declines  -Pneumonia: Completed Prevnar in 2018, pneumovax in 2019  Diet: She endorses a fair diet. Home cooked meals. Frequent desserts.  Exercise: She is walking two miles several days weekly.   Eye exam: Scheduled for December Dental exam: Completes semi-annually   Mammogram: No recent mammogram  Dexa: No recent exam Colonoscopy: Completed Cologuard in 2019, positive. She never completed colonoscopy as recommended.  Hep C Screen: Negative  BP Readings from Last 3 Encounters:  10/14/19 (!) 148/98  08/05/18 (!) 150/104  08/04/18 (!) 160/100   Wt Readings from Last 3 Encounters:  10/14/19 177 lb 8 oz (80.5 kg)  08/05/18 180 lb 8 oz (81.9 kg)  08/04/18 179 lb 8 oz (81.4 kg)   She is checking her BP at home daily which is running 120's-130's/70's-80's.   Review of Systems  Constitutional: Negative for unexpected weight change.  HENT: Negative for rhinorrhea.   Respiratory: Negative for cough and shortness of breath.   Cardiovascular: Negative for chest pain.  Gastrointestinal: Negative for constipation and diarrhea.  Genitourinary: Negative for difficulty urinating.  Musculoskeletal: Negative for arthralgias.  Skin: Negative for rash.  Allergic/Immunologic: Positive for environmental allergies.  Neurological: Negative for dizziness, numbness and headaches.  Psychiatric/Behavioral: The patient is not nervous/anxious.        Past Medical History:  Diagnosis Date  . Hyperlipidemia   . Hypertension   . Stroke [facility])      Social History   Socioeconomic History  . Marital status: Married    Spouse name: Not on file  . Number of children: Not on file  . Years of education: Not on file  .  Highest education level: Not on file  Occupational History  . Not on file  Social Needs  . Financial resource strain: Not on file  . Food insecurity    Worry: Not on file    Inability: Not on file  . Transportation needs    Medical: Not on file    Non-medical: Not on file  Tobacco Use  . Smoking status: Never Smoker  . Smokeless tobacco: Never Used  Substance and Sexual Activity  . Alcohol use: No    Alcohol/week: 0.0 standard drinks  . Drug use: No  . Sexual activity: Not on file  Lifestyle  . Physical activity    Days per week: Not on file    Minutes per session: Not on file  . Stress: Not on file  Relationships  . Social Herbalist on phone: Not on file    Gets together: Not on file    Attends religious service: Not on file    Active member of club or organization: Not on file    Attends meetings of clubs or organizations: Not on file    Relationship status: Not on file  . Intimate partner violence    Fear of current or ex partner: Not on file    Emotionally abused: Not on file    Physically abused: Not on file    Forced sexual activity: Not on file  Other Topics Concern  . Not on file  Social History Narrative   Married.   2 children, 4 g.children   Works at  [employer], quality.     Enjoys spending time in [place], camping, spending time with family.    Past Surgical History:  Procedure Laterality Date  . BLADDER SURGERY     Bladder Tack  . KNEE ARTHROSCOPY Left   . TUBAL LIGATION      Family History  Problem Relation Age of Onset  . Stroke Father   . Diabetes Father   . Hypertension Father   . Arrhythmia Mother   . Hypertension Mother   . Hypertension Brother     Allergies  Allergen Reactions  . Codeine Nausea Only and Other (See Comments)    Headaches also  . Morphine Nausea Only and Other (See Comments)    Headaches also  . Hydrochlorothiazide Nausea And Vomiting    Current Outpatient Medications on File Prior to Visit   Medication Sig Dispense Refill  . aspirin 325 MG tablet Take 1 tablet (325 mg total) by mouth daily. 60 tablet 0  . atorvastatin (LIPITOR) 40 MG tablet TAKE 1 TABLET BY MOUTH EVERY EVENING FOR CHOLESTEROL 90 tablet 0  . cholecalciferol (VITAMIN D) 1000 units tablet Take 1,000 Units by mouth daily.    Marland Kitchen gabapentin (NEURONTIN) 100 MG capsule TAKE 1 CAPSULE BY MOUTH 3 TIMES DAILY. 270 capsule 0  . lisinopril (ZESTRIL) 10 MG tablet TAKE 1 TABLET (10 MG TOTAL) BY MOUTH TWICE A DAY 180 tablet 0  . Multiple Vitamins-Minerals (MULTIVITAMIN GUMMIES ADULTS) CHEW Chew 2 each by mouth daily.     No current facility-administered medications on file prior to visit.     BP (!) 148/98   Pulse 74   Temp 97.9 F (36.6 C) (Temporal)   Ht 5\' 5"  (1.651 m)   Wt 177 lb 8 oz (80.5 kg)   SpO2 98%   BMI 29.54 kg/m    Objective:   Physical Exam  Constitutional: She is oriented to person, place, and time. She appears well-nourished.  HENT:  Right Ear: Tympanic membrane and ear canal normal.  Left Ear: Tympanic membrane and ear canal normal.  Mouth/Throat: Oropharynx is clear and moist.  Eyes: Pupils are equal, round, and reactive to light. EOM are normal.  Neck: Neck supple.  Cardiovascular: Normal rate and regular rhythm.  Respiratory: Effort normal and breath sounds normal.  GI: Soft. Bowel sounds are normal. There is no abdominal tenderness.  Musculoskeletal: Normal range of motion.  Neurological: She is alert and oriented to person, place, and time. No cranial nerve deficit.  Reflex Scores:      Patellar reflexes are 2+ on the right side and 2+ on the left side. Skin: Skin is warm and dry.  Psychiatric: She has a normal mood and affect.           Assessment & Plan:

## 2019-10-14 NOTE — Assessment & Plan Note (Signed)
Compliant to atorvastatin, repeat lipids pending. 

## 2019-10-14 NOTE — Assessment & Plan Note (Signed)
Chronic, overall stable with OTC treatment.

## 2019-10-20 ENCOUNTER — Encounter: Payer: Self-pay | Admitting: Primary Care

## 2019-11-22 ENCOUNTER — Other Ambulatory Visit: Payer: Self-pay | Admitting: Primary Care

## 2019-11-22 DIAGNOSIS — I10 Essential (primary) hypertension: Secondary | ICD-10-CM

## 2019-11-22 MED FILL — LISINOPRIL 10 MG TABS: 10 | 90 days supply | Qty: 180 | Fill #0

## 2019-12-06 ENCOUNTER — Other Ambulatory Visit: Payer: Self-pay | Admitting: Primary Care

## 2019-12-06 DIAGNOSIS — E785 Hyperlipidemia, unspecified: Secondary | ICD-10-CM

## 2019-12-06 MED FILL — ATORVASTATIN 40 MG TABLET: 40 | 90 days supply | Qty: 90 | Fill #0

## 2020-01-23 ENCOUNTER — Ambulatory Visit: Payer: PPO | Attending: Internal Medicine

## 2020-01-23 DIAGNOSIS — Z23 Encounter for immunization: Secondary | ICD-10-CM | POA: Insufficient documentation

## 2020-01-23 NOTE — Progress Notes (Signed)
   Covid-19 Vaccination Clinic  Name:  Veronica Andrade    MRN: [id number] DOB: [birth year]  01/23/2020  [name] was observed post Covid-19 immunization for 15 minutes without incidence. She was provided with Vaccine Information Sheet and instruction to access the V-Safe system.   [name] was instructed to call 911 with any severe reactions post vaccine: Marland Kitchen Difficulty breathing  . Swelling of your face and throat  . A fast heartbeat  . A bad rash all over your body  . Dizziness and weakness    Immunizations Administered    Name Date Dose VIS Date Route   Pfizer COVID-19 Vaccine 01/23/2020  4:24 PM 0.3 mL 11/05/2019 Intramuscular   Manufacturer: [employer], [employer]   Lot: [id number]   NDC: [address]-1

## 2020-02-22 ENCOUNTER — Ambulatory Visit: Payer: PPO | Attending: Internal Medicine

## 2020-02-22 DIAGNOSIS — Z23 Encounter for immunization: Secondary | ICD-10-CM

## 2020-02-22 MED FILL — LISINOPRIL 10 MG TABS: 10 | 90 days supply | Qty: 180 | Fill #1

## 2020-02-22 NOTE — Progress Notes (Signed)
2  Covid-19 Vaccination Clinic  Name:  Veronica Andrade    MRN: [id number] DOB: [birth year]  02/22/2020  [name] was observed post Covid-19 immunization for 15 minutes without incident. She was provided with Vaccine Information Sheet and instruction to access the V-Safe system.   [name] was instructed to call 911 with any severe reactions post vaccine: Marland Kitchen Difficulty breathing  . Swelling of face and throat  . A fast heartbeat  . A bad rash all over body  . Dizziness and weakness   Immunizations Administered    Name Date Dose VIS Date Route   Pfizer COVID-19 Vaccine 02/22/2020 11:56 AM 0.3 mL 11/05/2019 Intramuscular   Manufacturer: [employer], [employer]   Lot: [id number]   NDC: [address]-1

## 2020-03-22 MED FILL — ATORVASTATIN 40 MG TABLET: 40 | 90 days supply | Qty: 90 | Fill #1

## 2020-05-19 MED FILL — LISINOPRIL 10 MG TABS: 10 | 90 days supply | Qty: 180 | Fill #2

## 2020-06-22 MED FILL — ATORVASTATIN 40 MG TABLET: 40 | 90 days supply | Qty: 90 | Fill #2

## 2020-08-21 MED FILL — LISINOPRIL 10 MG TABS: 10 | 90 days supply | Qty: 180 | Fill #3

## 2020-09-19 ENCOUNTER — Other Ambulatory Visit: Payer: Self-pay | Admitting: Primary Care

## 2020-09-19 DIAGNOSIS — E785 Hyperlipidemia, unspecified: Secondary | ICD-10-CM

## 2020-09-20 NOTE — Telephone Encounter (Signed)
Pt called checking on rx She stated she has 2 pills left  Best number [phone]  Please let pt know when this has been called in

## 2020-09-21 ENCOUNTER — Other Ambulatory Visit: Payer: Self-pay | Admitting: Primary Care

## 2020-09-21 MED FILL — ATORVASTATIN 40 MG TABLET: 40 | 90 days supply | Qty: 90 | Fill #0

## 2020-11-15 ENCOUNTER — Telehealth: Payer: Self-pay | Admitting: Primary Care

## 2020-11-15 DIAGNOSIS — E785 Hyperlipidemia, unspecified: Secondary | ICD-10-CM

## 2020-11-15 DIAGNOSIS — I10 Essential (primary) hypertension: Secondary | ICD-10-CM

## 2020-11-16 ENCOUNTER — Other Ambulatory Visit: Payer: Self-pay | Admitting: Primary Care

## 2020-11-16 MED ORDER — ATORVASTATIN CALCIUM 40 MG PO TABS: 40.0000 mg | ORAL_TABLET | Freq: Every evening | ORAL | 0 refills | Status: DC

## 2020-11-16 MED ORDER — LISINOPRIL 10 MG PO TABS: ORAL_TABLET | ORAL | 0 refills | Status: DC

## 2020-11-16 MED FILL — LISINOPRIL 10 MG TABS: 10 | 30 days supply | Qty: 60 | Fill #0

## 2020-11-16 NOTE — Addendum Note (Signed)
Addended by: Doreene Nest on: 11/16/2020 02:43 PM   Modules accepted: Orders

## 2020-11-16 NOTE — Telephone Encounter (Signed)
Ok to call in for that long or do we need to see sooner?

## 2020-11-16 NOTE — Telephone Encounter (Signed)
Why is she scheduled out for so far? Yes, need to see much sooner.

## 2020-11-16 NOTE — Telephone Encounter (Signed)
She has 4 pills left

## 2020-11-16 NOTE — Telephone Encounter (Signed)
Scheduled or 01/26/21 at 9:40

## 2020-11-20 NOTE — Telephone Encounter (Signed)
Can you call and move appointment sooner?

## 2020-11-21 NOTE — Telephone Encounter (Signed)
Called patient to rsc appointment sooner. LVM to call back.

## 2020-11-22 NOTE — Telephone Encounter (Signed)
Called patient to rsc sooner. LVM to call back. Letter sent

## 2020-12-19 ENCOUNTER — Other Ambulatory Visit: Payer: Self-pay | Admitting: Primary Care

## 2020-12-19 DIAGNOSIS — I10 Essential (primary) hypertension: Secondary | ICD-10-CM

## 2020-12-19 MED FILL — ATORVASTATIN 40 MG TABLET: 40 | 30 days supply | Qty: 30 | Fill #0

## 2020-12-20 ENCOUNTER — Other Ambulatory Visit: Payer: Self-pay | Admitting: Primary Care

## 2020-12-20 MED FILL — LISINOPRIL 10 MG TABS: 10 | 30 days supply | Qty: 60 | Fill #0

## 2020-12-20 NOTE — Telephone Encounter (Signed)
Pharmacy requests refill on: Lisinopril 10 mg   LAST REFILL: 11/16/2020 (Q-60, R-0) LAST OV: 10/14/2019 NEXT OV: 01/26/2021 PHARMACY: [facility]

## 2021-01-18 ENCOUNTER — Other Ambulatory Visit: Payer: Self-pay | Admitting: Primary Care

## 2021-01-18 DIAGNOSIS — I10 Essential (primary) hypertension: Secondary | ICD-10-CM

## 2021-01-18 DIAGNOSIS — E785 Hyperlipidemia, unspecified: Secondary | ICD-10-CM

## 2021-01-19 ENCOUNTER — Other Ambulatory Visit: Payer: Self-pay

## 2021-01-19 DIAGNOSIS — E785 Hyperlipidemia, unspecified: Secondary | ICD-10-CM

## 2021-01-19 DIAGNOSIS — I10 Essential (primary) hypertension: Secondary | ICD-10-CM

## 2021-01-19 MED ORDER — ATORVASTATIN CALCIUM 40 MG PO TABS: 40.0000 mg | ORAL_TABLET | Freq: Every evening | ORAL | 0 refills | Status: DC

## 2021-01-19 MED ORDER — LISINOPRIL 10 MG PO TABS: ORAL_TABLET | ORAL | 0 refills | Status: DC

## 2021-01-19 MED FILL — LISINOPRIL 10 MG TABS: 10 | 7 days supply | Qty: 14 | Fill #0

## 2021-01-19 MED FILL — ATORVASTATIN 40 MG TABLET: 40 | 7 days supply | Qty: 7 | Fill #0

## 2021-01-19 NOTE — Telephone Encounter (Signed)
Patient called back will not be out Sunday. I have called in 7 days of both patient aware.

## 2021-01-19 NOTE — Telephone Encounter (Signed)
Noted  

## 2021-01-19 NOTE — Telephone Encounter (Signed)
Left message to return call to our office.  

## 2021-01-19 NOTE — Telephone Encounter (Signed)
Does she have enough meds to make it until her appointment next week? If not, then send her 1 week supply of both.

## 2021-01-26 ENCOUNTER — Other Ambulatory Visit: Payer: Self-pay

## 2021-01-26 ENCOUNTER — Encounter: Payer: Self-pay | Admitting: Primary Care

## 2021-01-26 ENCOUNTER — Ambulatory Visit (INDEPENDENT_AMBULATORY_CARE_PROVIDER_SITE_OTHER): Payer: PPO | Admitting: Primary Care

## 2021-01-26 ENCOUNTER — Other Ambulatory Visit: Payer: Self-pay | Admitting: Primary Care

## 2021-01-26 VITALS — BP 130/100 | HR 77 | Temp 97.4°F | Ht 65.0 in | Wt 180.5 lb

## 2021-01-26 DIAGNOSIS — Z8673 Personal history of transient ischemic attack (TIA), and cerebral infarction without residual deficits: Secondary | ICD-10-CM

## 2021-01-26 DIAGNOSIS — Z1231 Encounter for screening mammogram for malignant neoplasm of breast: Secondary | ICD-10-CM

## 2021-01-26 DIAGNOSIS — E785 Hyperlipidemia, unspecified: Secondary | ICD-10-CM | POA: Diagnosis not present

## 2021-01-26 DIAGNOSIS — Z0000 Encounter for general adult medical examination without abnormal findings: Secondary | ICD-10-CM | POA: Diagnosis not present

## 2021-01-26 DIAGNOSIS — E2839 Other primary ovarian failure: Secondary | ICD-10-CM | POA: Diagnosis not present

## 2021-01-26 DIAGNOSIS — Z23 Encounter for immunization: Secondary | ICD-10-CM

## 2021-01-26 DIAGNOSIS — R7303 Prediabetes: Secondary | ICD-10-CM | POA: Diagnosis not present

## 2021-01-26 DIAGNOSIS — I10 Essential (primary) hypertension: Secondary | ICD-10-CM | POA: Diagnosis not present

## 2021-01-26 LAB — COMPREHENSIVE METABOLIC PANEL
ALT: 24 U/L (ref 0–35)
AST: 24 U/L (ref 0–37)
Albumin: 4.5 g/dL (ref 3.5–5.2)
Alkaline Phosphatase: 71 U/L (ref 39–117)
BUN: 16 mg/dL (ref 6–23)
CO2: 27 mEq/L (ref 19–32)
Calcium: 9.6 mg/dL (ref 8.4–10.5)
Chloride: 105 mEq/L (ref 96–112)
Creatinine, Ser: 0.72 mg/dL (ref 0.40–1.20)
GFR: 85.16 mL/min (ref >60.00)
Glucose, Bld: 118 mg/dL — ABNORMAL HIGH (ref 70–99)
Potassium: 4.3 mEq/L (ref 3.5–5.1)
Sodium: 139 mEq/L (ref 135–145)
Total Bilirubin: 0.8 mg/dL (ref 0.2–1.2)
Total Protein: 7.4 g/dL (ref 6.0–8.3)

## 2021-01-26 LAB — CBC
HCT: 40.5 % (ref 36.0–46.0)
Hemoglobin: 14 g/dL (ref 12.0–15.0)
MCHC: 34.5 g/dL (ref 30.0–36.0)
MCV: 89.6 fl (ref 78.0–100.0)
Platelets: 198 10*3/uL (ref 150.0–400.0)
RBC: 4.52 Mil/uL (ref 3.87–5.11)
RDW: 13.8 % (ref 11.5–15.5)
WBC: 4.8 10*3/uL (ref 4.0–10.5)

## 2021-01-26 LAB — LIPID PANEL
Cholesterol: 116 mg/dL (ref 0–200)
HDL: 40.2 mg/dL (ref >39.00)
LDL Cholesterol: 64 mg/dL (ref 0–99)
NonHDL: 75.45
Total CHOL/HDL Ratio: 3
Triglycerides: 59 mg/dL (ref 0.0–149.0)
VLDL: 11.8 mg/dL (ref 0.0–40.0)

## 2021-01-26 LAB — HEMOGLOBIN A1C: Hgb A1c MFr Bld: 6.6 % — ABNORMAL HIGH (ref 4.6–6.5)

## 2021-01-26 MED ORDER — LISINOPRIL 10 MG PO TABS
ORAL_TABLET | ORAL | 3 refills | Status: DC
Start: 2021-01-26 — End: 2021-01-26

## 2021-01-26 MED ORDER — ZOSTER VAC RECOMB ADJUVANTED 50 MCG/0.5ML IM SUSR: 0.5000 mL | Freq: Once | INTRAMUSCULAR | 1 refills | Status: AC

## 2021-01-26 MED ORDER — ATORVASTATIN CALCIUM 40 MG PO TABS: 40.0000 mg | ORAL_TABLET | Freq: Every evening | ORAL | 3 refills | Status: DC

## 2021-01-26 MED FILL — ATORVASTATIN 40 MG TABLET: 40 | 90 days supply | Qty: 90 | Fill #0

## 2021-01-26 MED FILL — LISINOPRIL 10 MG TABS: 10 | 90 days supply | Qty: 180 | Fill #0

## 2021-01-26 NOTE — Assessment & Plan Note (Addendum)
Above goal in the office today, home readings much better. She does have white coat syndrome.   Offered to change her lisinopril due to her ACE-I cough, she kindly declines. Would change to olmesartan 20 mg if she decides to change.  CMP pending.

## 2021-01-26 NOTE — Assessment & Plan Note (Signed)
No new symptoms. Blood pressure seems to be controlled at home, whitecoat syndrome in the office.  LDL goal of less than 70. Repeat lipid panel pending.  Continue atorvastatin 40 mg and aspirin 325 mg.

## 2021-01-26 NOTE — Patient Instructions (Addendum)
Stop by the lab prior to leaving today. I will notify you of your results once received.   Take the shingles vaccine to the pharmacy as discussed.  Start exercising. You should be getting 150 minutes of moderate intensity exercise weekly.  Continue to work on [employer].  Call for appointment for mammogram and bone density appointment.   We will repeat your Cologuard test this October.  It was a pleasure to see you today!   Preventive Care 54 Years and Older, Female Preventive care refers to lifestyle choices and visits with your health care provider that can promote health and wellness. This includes:  A yearly physical exam. This is also called an annual wellness visit.  Regular dental and eye exams.  Immunizations.  Screening for certain conditions.  Healthy lifestyle choices, such as: ? Eating a healthy diet. ? Getting regular exercise. ? Not using drugs or products that contain nicotine and tobacco. ? Limiting alcohol use. What can I expect for my preventive care visit? Physical exam Your health care provider will check your:  Height and weight. These may be used to calculate your BMI (body mass index). BMI is a measurement that tells if you are at a healthy weight.  Heart rate and blood pressure.  Body temperature.  Skin for abnormal spots. Counseling Your health care provider may ask you questions about your:  Past medical problems.  Family's medical history.  Alcohol, tobacco, and drug use.  Emotional well-being.  Home life and relationship well-being.  Sexual activity.  Diet, exercise, and sleep habits.  History of falls.  Memory and ability to understand (cognition).  Work and work Statistician.  Pregnancy and menstrual history.  Access to firearms. What immunizations do I need? Vaccines are usually given at various ages, according to a schedule. Your health care provider will recommend vaccines for you based on your age, medical history,  and lifestyle or other factors, such as travel or where you work.   What tests do I need? Blood tests  Lipid and cholesterol levels. These may be checked every 5 years, or more often depending on your overall health.  Hepatitis C test.  Hepatitis B test. Screening  Lung cancer screening. You may have this screening every year starting at age 70 if you have a 30-pack-year history of smoking and currently smoke or have quit within the past 15 years.  Colorectal cancer screening. ? All adults should have this screening starting at age 72 and continuing until age 49. ? Your health care provider may recommend screening at age 71 if you are at increased risk. ? You will have tests every 1-10 years, depending on your results and the type of screening test.  Diabetes screening. ? This is done by checking your blood sugar (glucose) after you have not eaten for a while (fasting). ? You may have this done every 1-3 years.  Mammogram. ? This may be done every 1-2 years. ? Talk with your health care provider about how often you should have regular mammograms.  Abdominal aortic aneurysm (AAA) screening. You may need this if you are a current or former smoker.  BRCA-related cancer screening. This may be done if you have a family history of breast, ovarian, tubal, or peritoneal cancers. Other tests  STD (sexually transmitted disease) testing, if you are at risk.  Bone density scan. This is done to screen for osteoporosis. You may have this done starting at age 19. Talk with your health care provider about your test  results, treatment options, and if necessary, the need for more tests. Follow these instructions at home: Eating and drinking  Eat a diet that includes fresh fruits and vegetables, whole grains, lean protein, and low-fat dairy products. Limit your intake of foods with high amounts of sugar, saturated fats, and salt.  Take vitamin and mineral supplements as recommended by your  health care provider.  Do not drink alcohol if your health care provider tells you not to drink.  If you drink alcohol: ? Limit how much you have to 0-1 drink a day. ? Be aware of how much alcohol is in your drink. In the [place] one drink equals one 12 oz bottle of beer (355 mL), one 5 oz glass of wine (148 mL), or one 1 oz glass of hard liquor (44 mL).   Lifestyle  Take daily care of your teeth and gums. Brush your teeth every morning and night with fluoride toothpaste. Floss one time each day.  Stay active. Exercise for at least 30 minutes 5 or more days each week.  Do not use any products that contain nicotine or tobacco, such as cigarettes, e-cigarettes, and chewing tobacco. If you need help quitting, ask your health care provider.  Do not use drugs.  If you are sexually active, practice safe sex. Use a condom or other form of protection in order to prevent STIs (sexually transmitted infections).  Talk with your health care provider about taking a low-dose aspirin or statin.  Find healthy ways to cope with stress, such as: ? Meditation, yoga, or listening to music. ? Journaling. ? Talking to a trusted person. ? Spending time with friends and family. Safety  Always wear your seat belt while driving or riding in a vehicle.  Do not drive: ? If you have been drinking alcohol. Do not ride with someone who has been drinking. ? When you are tired or distracted. ? While texting.  Wear a helmet and other protective equipment during sports activities.  If you have firearms in your house, make sure you follow all gun safety procedures. What's next?  Visit your health care provider once a year for an annual wellness visit.  Ask your health care provider how often you should have your eyes and teeth checked.  Stay up to date on all vaccines. This information is not intended to replace advice given to you by your health care provider. Make sure you discuss any questions you have  with your health care provider. Document Revised: 11/01/2020 Document Reviewed: 11/05/2018 Elsevier Patient Education  2021 [employer].

## 2021-01-26 NOTE — Assessment & Plan Note (Signed)
Repeat lipid panel pending, continue atorvastatin 40 mg.  LDL goal of less than 70.

## 2021-01-26 NOTE — Progress Notes (Signed)
Subjective:    Patient ID: Veronica Andrade, female    DOB: 1951-05-06, 70 y.o.   MRN: [id number]  HPI  This visit occurred during the SARS-CoV-2 public health emergency.  Safety protocols were in place, including screening questions prior to the visit, additional usage of staff PPE, and extensive cleaning of exam room while observing appropriate contact time as indicated for disinfecting solutions.   Veronica Andrade is a 70 year old female who presents today for complete physical.  Immunizations: -Influenza: Completed this season Tetanus: 2016 -Shingles: Due, never completed  -Pneumonia: Prevnar in 2018, Pneumovax in 2019 -Covid-19: Completed 2 vaccines   Diet: She endorses a fair diet. Exercise: No regular exercise  Eye exam: No recent exam, she will schedule Dental exam: Due  Mammogram: No recent mammogram, due. Dexa: No recent bone density scan, due. Colonoscopy: Cologuard in 2019, positive. Declines colonoscopy. Hep C Screen: Negative.  BP Readings from Last 3 Encounters:  01/26/21 (!) 130/100  10/14/19 (!) 148/98  08/05/18 (!) 150/104   Wt Readings from Last 3 Encounters:  01/26/21 180 lb 8 oz (81.9 kg)  10/14/19 177 lb 8 oz (80.5 kg)  08/05/18 180 lb 8 oz (81.9 kg)   She is checking her Bblood pressure  110's-130's/70's-80's. This morning was 164/88. She does experience a chronic, daily, cough every morning for about 45 minutes, no cough usually throughout the day.  Review of Systems  Constitutional: Negative for unexpected weight change.  HENT: Negative for rhinorrhea.   Eyes: Negative for visual disturbance.  Respiratory: Negative for shortness of breath.        Cough in AM from ACE-I  Cardiovascular: Negative for chest pain.  Gastrointestinal: Negative for constipation and diarrhea.  Genitourinary: Negative for difficulty urinating and menstrual problem.  Musculoskeletal: Negative for arthralgias and myalgias.  Skin: Negative for rash.  Allergic/Immunologic:  Negative for environmental allergies.  Neurological: Negative for dizziness, numbness and headaches.  Psychiatric/Behavioral: The patient is not nervous/anxious.        Past Medical History:  Diagnosis Date  . 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 Hyperlipidemia   . Hypertension   . Stroke [facility])      Social History   Socioeconomic History  . Marital status: Married    Spouse name: Not on file  . Number of children: Not on file  . Years of education: Not on file  . Highest education level: Not on file  Occupational History  . Not on file  Tobacco Use  . Smoking status: Never Smoker  . Smokeless tobacco: Never Used  Vaping Use  . Vaping Use: Never used  Substance and Sexual Activity  . Alcohol use: No    Alcohol/week: 0.0 standard drinks  . Drug use: No  . Sexual activity: Not on file  Other Topics Concern  . Not on file  Social History Narrative   Married.   2 children, 4 g.children   Works at [employer], Chief of Staff.     Enjoys spending time in [place], camping, spending time with family.   Social Determinants of Health   Financial Resource Strain: Not on file  Food Insecurity: Not on file  Transportation Needs: Not on file  Physical Activity: Not on file  Stress: Not on file  Social Connections: Not on file  Intimate Partner Violence: Not on file    Past Surgical History:  Procedure Laterality Date  . BLADDER SURGERY     Bladder Tack  .  KNEE ARTHROSCOPY Left   . TUBAL LIGATION      Family History  Problem Relation Age of Onset  . Stroke Father   . Diabetes Father   . Hypertension Father   . Arrhythmia Mother   . Hypertension Mother   . Hypertension Brother     Allergies  Allergen Reactions  . Codeine Nausea Only and Other (See Comments)    Headaches also  . Morphine Nausea Only and Other (See Comments)    Headaches also  . Hydrochlorothiazide Nausea And Vomiting    Current  Outpatient Medications on File Prior to Visit  Medication Sig Dispense Refill  . aspirin 325 MG tablet Take 1 tablet (325 mg total) by mouth daily. 60 tablet 0  . atorvastatin (LIPITOR) 40 MG tablet Take 1 tablet (40 mg total) by mouth every evening. for cholesterol 7 tablet 0  . cholecalciferol (VITAMIN D) 1000 units tablet Take 1,000 Units by mouth daily.    Marland Kitchen lisinopril (ZESTRIL) 10 MG tablet Take one tablet by mouth twice a day for blood pressure. 14 tablet 0  . Multiple Vitamins-Minerals (MULTIVITAMIN GUMMIES ADULTS) CHEW Chew 2 each by mouth daily.     No current facility-administered medications on file prior to visit.    BP (!) 130/100   Pulse 77   Temp (!) 97.4 F (36.3 C) (Temporal)   Ht 5\' 5"  (1.651 m)   Wt 180 lb 8 oz (81.9 kg)   SpO2 95%   BMI 30.04 kg/m    Objective:   Physical Exam Constitutional:      Appearance: She is well-nourished.  HENT:     Right Ear: Tympanic membrane and ear canal normal.     Left Ear: Tympanic membrane and ear canal normal.     Mouth/Throat:     Mouth: Oropharynx is clear and moist.  Eyes:     Extraocular Movements: EOM normal.     Pupils: Pupils are equal, round, and reactive to light.  Cardiovascular:     Rate and Rhythm: Normal rate and regular rhythm.  Pulmonary:     Effort: Pulmonary effort is normal.     Breath sounds: Normal breath sounds.  Abdominal:     General: Bowel sounds are normal.     Palpations: Abdomen is soft.     Tenderness: There is no abdominal tenderness.  Musculoskeletal:        General: Normal range of motion.     Cervical back: Neck supple.  Skin:    General: Skin is warm and dry.  Neurological:     Mental Status: She is alert and oriented to person, place, and time.     Cranial Nerves: No cranial nerve deficit.     Deep Tendon Reflexes:     Reflex Scores:      Patellar reflexes are 2+ on the right side and 2+ on the left side. Psychiatric:        Mood and Affect: Mood and affect and mood normal.             Assessment & Plan:

## 2021-01-26 NOTE — Assessment & Plan Note (Signed)
Discussed the importance of a healthy diet and regular exercise in order for weight loss, and to reduce the risk of any potential medical problems.  Repeat A1c pending. 

## 2021-01-26 NOTE — Assessment & Plan Note (Signed)
Shingrix due, prescription provided to obtain at pharmacy.  Other vaccines up-to-date.  Mammogram and bone density overdue, orders placed.  Colon cancer screening overdue, positive Cologuard result in 2019 and patient refused colonoscopy.  She continues to refuse colonoscopy despite recommendations, does agree to repeat Cologuard this fall when due.  Encouraged healthy diet regular exercise. Exam today unremarkable. Labs pending.

## 2021-02-02 ENCOUNTER — Telehealth: Payer: Self-pay | Admitting: Primary Care

## 2021-02-02 NOTE — Telephone Encounter (Signed)
Patient returned your call about lab results EM 

## 2021-04-24 ENCOUNTER — Other Ambulatory Visit (HOSPITAL_COMMUNITY): Payer: Self-pay

## 2021-04-24 MED FILL — Lisinopril Tab 10 MG: ORAL | 90 days supply | Qty: 180 | Fill #0 | Status: AC

## 2021-04-24 MED FILL — Atorvastatin Calcium Tab 40 MG (Base Equivalent): ORAL | 90 days supply | Qty: 90 | Fill #0 | Status: AC

## 2021-07-25 ENCOUNTER — Other Ambulatory Visit (HOSPITAL_COMMUNITY): Payer: Self-pay

## 2021-07-25 MED FILL — Atorvastatin Calcium Tab 40 MG (Base Equivalent): ORAL | 90 days supply | Qty: 90 | Fill #1 | Status: AC

## 2021-07-25 MED FILL — Lisinopril Tab 10 MG: ORAL | 90 days supply | Qty: 180 | Fill #1 | Status: AC

## 2021-07-27 ENCOUNTER — Other Ambulatory Visit (HOSPITAL_COMMUNITY): Payer: Self-pay

## 2021-11-02 ENCOUNTER — Other Ambulatory Visit (HOSPITAL_COMMUNITY): Payer: Self-pay

## 2021-11-02 MED FILL — Lisinopril Tab 10 MG: ORAL | 90 days supply | Qty: 180 | Fill #2 | Status: AC

## 2021-11-02 MED FILL — Atorvastatin Calcium Tab 40 MG (Base Equivalent): ORAL | 90 days supply | Qty: 90 | Fill #2 | Status: AC

## 2022-01-24 ENCOUNTER — Telehealth: Payer: Self-pay | Admitting: Primary Care

## 2022-01-24 NOTE — Telephone Encounter (Signed)
LVM for pt to rtn my call to schedule AWV with [facility]. Please schedule if pt calls the office.  ?

## 2022-01-31 NOTE — Progress Notes (Signed)
Subjective:   Veronica Andrade is a 71 y.o. female who presents for Medicare Annual (Subsequent) preventive examination.  I connected with Veronica Andrade today by telephone and verified that I am speaking with the correct person using two identifiers. Location patient: home Location provider: work Persons participating in the virtual visit: patient, Engineer, civil (consulting).    I discussed the limitations, risks, security and privacy concerns of performing an evaluation and management service by telephone and the availability of in person appointments. I also discussed with the patient that there may be a patient responsible charge related to this service. The patient expressed understanding and verbally consented to this telephonic visit.    Interactive audio and video telecommunications were attempted between this provider and patient, however failed, due to patient having technical difficulties OR patient did not have access to video capability.  We continued and completed visit with audio only.  Some vital signs may be absent or patient reported.   Time Spent with patient on telephone encounter: 20 minutes  Review of Systems     Cardiac Risk Factors include: advanced age (>60men, >17 women);hypertension;dyslipidemia     Objective:    Today's Vitals   02/01/22 1511  Weight: 180 lb (81.6 kg)  Height:  (1.651 m)   Body mass index is 29.95 kg/m.  Advanced Directives 02/01/2022 08/05/2018 12/05/2015 11/25/2015  Does Patient Have a Medical Advance Directive? Yes Yes Yes No  Type of Advance Directive Living will Living will Living will -  Does patient want to make changes to medical advance directive? Yes (MAU/Ambulatory/Procedural Areas - Information given) - - -  Copy of Healthcare Power of Attorney in Chart? - - No - copy requested -  Would patient like information on creating a medical advance directive? - - - No - patient declined information    Current Medications (verified) Outpatient  Encounter Medications as of 02/01/2022  Medication Sig   aspirin 325 MG tablet Take 1 tablet (325 mg total) by mouth daily.   cholecalciferol (VITAMIN D) 1000 units tablet Take 1,000 Units by mouth daily.   Multiple Vitamins-Minerals (MULTIVITAMIN GUMMIES ADULTS) CHEW Chew 2 each by mouth daily.   vitamin B-12 (CYANOCOBALAMIN) 1000 MCG tablet Take 1,000 mcg by mouth daily.   Zinc 25 MG TABS Take 25 mg by mouth daily.   atorvastatin (LIPITOR) 40 MG tablet TAKE 1 TABLET (40 MG TOTAL) BY MOUTH EVERY EVENING. FOR CHOLESTEROL (Patient taking differently: Take 40 mg by mouth daily.)   lisinopril (ZESTRIL) 10 MG tablet TAKE ONE TABLET BY MOUTH TWICE A DAY FOR BLOOD PRESSURE. (Patient taking differently: Take 10 mg by mouth in the morning and at bedtime.)   No facility-administered encounter medications on file as of 02/01/2022.    Allergies (verified) Codeine, Morphine, and Hydrochlorothiazide   History: Past Medical History:  Diagnosis Date   Cerebrovascular accident (CVA) due to thrombosis of left posterior cerebral artery (HCC) 04/23/2016   Hyperlipidemia    Hypertension    Stroke [facility])    Past Surgical History:  Procedure Laterality Date   BLADDER SURGERY     Bladder Tack   KNEE ARTHROSCOPY Left    TUBAL LIGATION     Family History  Problem Relation Age of Onset   Stroke Father    Diabetes Father    Hypertension Father    Arrhythmia Mother    Hypertension Mother    Hypertension Brother    Social History   Socioeconomic History   Marital status: Married  Spouse name: Not on file   Number of children: Not on file   Years of education: Not on file   Highest education level: Not on file  Occupational History   Not on file  Tobacco Use   Smoking status: Never   Smokeless tobacco: Never  Vaping Use   Vaping Use: Never used  Substance and Sexual Activity   Alcohol use: No    Alcohol/week: 0.0 standard drinks   Drug use: No   Sexual activity: Not on file  Other Topics  Concern   Not on file  Social History Narrative   Married.   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     [place], spending time with family.   Social Determinants of Health   Financial Resource Strain: Low Risk    Difficulty of Paying Living Expenses: Not hard at all  Food Insecurity: No Food Insecurity   Worried About Programme researcher, broadcasting/film/videounning Out of Food in the Last Year: Never true   Ran Out of Food in the Last Year: Never true  Transportation Needs: No Transportation Needs   Lack of Transportation (Medical): No   Lack of Transportation (Non-Medical): No  Physical Activity: Insufficiently Active   Days of Exercise per Week: 4 days   Minutes of Exercise per Session: 30 min  Stress: No Stress Concern Present   Feeling of Stress : Only a little  Social Connections: Press photographerocially Integrated   Frequency of Communication with Friends and Family: More than three times a week   Frequency of Social Gatherings with Friends and Family: More than three times a week   Attends Religious Services: More than 4 times per year   Active Member of [employer] or Organizations: Yes   Attends Engineer, structuralClub or Organization Meetings: More than 4 times per year   Marital Status: Married    Tobacco Counseling Counseling given: Not Answered   Clinical Intake:  Pre-visit preparation completed: Yes  Pain : No/denies pain     BMI - recorded: 29.95 Nutritional Status: BMI 25 -29 Overweight Nutritional Risks: None Diabetes: No  How often do you need to have someone help you when you read instructions, pamphlets, or other written materials from your doctor or pharmacy?: 1 - Never  Diabetic? No  Interpreter Needed?: No  Information entered by :: Sharen Heckamina Grady Lucci LPN   Activities of Daily Living In your present state of health, do you have any difficulty performing the following activities: 02/01/2022  Hearing? N  Vision? N  Difficulty concentrating or making decisions? N   Walking or climbing stairs? N  Dressing or bathing? N  Doing errands, shopping? N  Preparing Food and eating ? N  Using the Toilet? N  In the past six months, have you accidently leaked urine? Y  Comment wears pad  Do you have problems with loss of bowel control? N  Managing your Medications? N  Managing your Finances? N  Housekeeping or managing your Housekeeping? N  Some recent data might be hidden    Patient Care Team: Doreene Nestlark, Katherine K, NP as PCP - General (Internal Medicine)  Indicate any recent Medical Services you may have received from other than [facility] providers in the past year (date may be approximate).     Assessment:   This is a routine wellness examination for Veronica Andrade.  Hearing/Vision screen Hearing Screening - Comments:: No issues  Vision Screening - Comments:: Last exam over a year ago  Dietary issues and  exercise activities discussed: Current Exercise Habits: Home exercise routine, Type of exercise: walking, Time (Minutes): 25, Frequency (Times/Week): 4, Weekly Exercise (Minutes/Week): 100, Intensity: Moderate   Goals Addressed             This Visit's Progress    Patient Stated       Would like to lose 10-15 pounds       Depression Screen PHQ 2/9 Scores 02/01/2022 08/05/2018 08/04/2018 02/07/2017  PHQ - 2 Score 0 0 0 0  PHQ- 9 Score - 0 - -    Fall Risk Fall Risk  02/01/2022 08/05/2018 08/04/2018 02/07/2017 12/18/2016  Falls in the past year? 0 No No No Yes  Number falls in past yr: 0 - - - 1  Injury with Fall? 0 - - - No  Risk for fall due to : No Fall Risks - - - -  Follow up Falls prevention discussed - - - -    FALL RISK PREVENTION PERTAINING TO THE HOME:  Any stairs in or around the home? Yes  If so, are there any without handrails? No  Home free of loose throw rugs in walkways, pet beds, electrical cords, etc? Yes  Adequate lighting in your home to reduce risk of falls? Yes   ASSISTIVE DEVICES UTILIZED TO PREVENT FALLS:  Life alert? No   Use of a cane, walker or w/c? No  Grab bars in the bathroom? No  Shower chair or bench in shower? No  Elevated toilet seat or a handicapped toilet? Yes   TIMED UP AND GO:  Was the test performed? No .    Cognitive Function: Normal cognitive status assessed by  this Nurse Health Advisor. No abnormalities found.   MMSE - Mini Mental State Exam 08/05/2018  Orientation to time 5  Orientation to Place 5  Registration 3  Attention/ Calculation 0  Recall 3  Language- name 2 objects 0  Language- repeat 1  Language- follow 3 step command 3  Language- read & follow direction 0  Write a sentence 0  Copy design 0  Total score 20        Immunizations Immunization History  Administered Date(s) Administered   Influenza, High Dose Seasonal PF 07/21/2019, 08/09/2020   Influenza,inj,Quad PF,6+ Mos 08/04/2018   PFIZER(Purple Top)SARS-COV-2 Vaccination 01/23/2020, 02/22/2020   Pneumococcal Conjugate-13 03/13/2017   Pneumococcal Polysaccharide-23 08/04/2018   Td 11/24/2015    TDAP status: Up to date  Flu Vaccine status: Up to date  Pneumococcal vaccine status: Up to date  Covid-19 vaccine status: Information provided on how to obtain vaccines.   Qualifies for Shingles Vaccine? Yes   Zostavax completed No   Shingrix Completed?: No.    Education has been provided regarding the importance of this vaccine. Patient has been advised to call insurance company to determine out of pocket expense if they have not yet received this vaccine. Advised may also receive vaccine at local pharmacy or Health Dept. Verbalized acceptance and understanding.  Screening Tests Health Maintenance  Topic Date Due   Zoster Vaccines- Shingrix (1 of 2) Never done   DEXA SCAN  Never done   MAMMOGRAM  09/30/2018   COVID-19 Vaccine (3 - Booster for Pfizer series) 04/18/2020   INFLUENZA VACCINE  06/25/2021   Fecal DNA (Cologuard)  09/16/2021   TETANUS/TDAP  11/23/2025   Pneumonia Vaccine 58+ Years old   Completed   Hepatitis C Screening  Completed   HPV VACCINES  Aged Out    Health Maintenance  Health  Maintenance Due  Topic Date Due   Zoster Vaccines- Shingrix (1 of 2) Never done   DEXA SCAN  Never done   MAMMOGRAM  09/30/2018   COVID-19 Vaccine (3 - Booster for Pfizer series) 04/18/2020   INFLUENZA VACCINE  06/25/2021   Fecal DNA (Cologuard)  09/16/2021    Colorectal Cancer screening: patient plans to discuss with PCP when decided   Mammogram status: Ordered 02/01/22. Pt provided with contact info and advised to call to schedule appt.   Bone Density status: Ordered 02/01/22. Pt provided with contact info and advised to call to schedule appt.  Lung Cancer Screening: (Low Dose CT Chest recommended if Age 58-80 years, 30 pack-year currently smoking OR have quit w/in 15years.) does not qualify.     Additional Screening:  Hepatitis C Screening: does qualify; Completed 01/31/17  Vision Screening: Recommended annual ophthalmology exams for early detection of glaucoma and other disorders of the eye. Is the patient up to date with their annual eye exam?  No  Who is the provider or what is the name of the office in which the patient attends annual eye exams? Provider information unavailable   Dental Screening: Recommended annual dental exams for proper oral hygiene  Community Resource Referral / Chronic Care Management: CRR required this visit?  No   CCM required this visit?  No      Plan:     I have personally reviewed and noted the following in the patients chart:   Medical and social history Use of alcohol, tobacco or illicit drugs  Current medications and supplements including opioid prescriptions.  Functional ability and status Nutritional status Physical activity Advanced directives List of other physicians Hospitalizations, surgeries, and ER visits in previous 12 months Vitals Screenings to include cognitive, depression, and falls Referrals and  appointments  In addition, I have reviewed and discussed with patient certain preventive protocols, quality metrics, and best practice recommendations. A written personalized care plan for preventive services as well as general preventive health recommendations were provided to patient.   Due to this being a telephonic visit, the after visit summary with patients personalized plan was offered to patient via mail or my-chart. Patient would like to access on my-chart.     Janne Lab, LPN   [id number]   Nurse Health Advisor  Nurse Notes: none

## 2022-02-01 ENCOUNTER — Ambulatory Visit (INDEPENDENT_AMBULATORY_CARE_PROVIDER_SITE_OTHER): Payer: PPO

## 2022-02-01 VITALS — Ht 65.0 in | Wt 180.0 lb

## 2022-02-01 DIAGNOSIS — Z780 Asymptomatic menopausal state: Secondary | ICD-10-CM | POA: Diagnosis not present

## 2022-02-01 DIAGNOSIS — Z0000 Encounter for general adult medical examination without abnormal findings: Secondary | ICD-10-CM

## 2022-02-01 DIAGNOSIS — Z1231 Encounter for screening mammogram for malignant neoplasm of breast: Secondary | ICD-10-CM | POA: Diagnosis not present

## 2022-02-01 NOTE — Patient Instructions (Addendum)
Veronica Andrade , Thank you for taking time to complete your Medicare Wellness Visit. I appreciate your ongoing commitment to your health goals. Please review the following plan we discussed and let me know if I can assist you in the future.   Screening recommendations/referrals: Colonoscopy: cologuard due, discuss with PCP if you change your mind Mammogram: due, ordered today, someone will call to schedule an appointment Bone Density: due, ordered today, someone will call to schedule an appointment Recommended yearly ophthalmology/optometry visit for glaucoma screening and checkup Dental visit : Up to date   Vaccinations: Influenza vaccine: up to date  Pneumococcal vaccine: up to date  Tdap vaccine: up to date due 11/24/15 Shingles vaccine: Discuss with pharmacy   Covid-19:newest booster available at your local pharmacy  Advanced directives: Please bring a copy of Living Will and/or Healthcare Power of Attorney for your chart.   Conditions/risks identified: see problem list   Next appointment: Follow up in one year for your annual wellness visit    Preventive Care 65 Years and Older, Female Preventive care refers to lifestyle choices and visits with your health care provider that can promote health and wellness. What does preventive care include? A yearly physical exam. This is also called an annual well check. Dental exams once or twice a year. Routine eye exams. Ask your health care provider how often you should have your eyes checked. Personal lifestyle choices, including: Daily care of your teeth and gums. Regular physical activity. Eating a healthy diet. Avoiding tobacco and drug use. Limiting alcohol use. Practicing safe sex. Taking low-dose aspirin every day. Taking vitamin and mineral supplements as recommended by your health care provider. What happens during an annual well check? The services and screenings done by your health care provider during your annual well  check will depend on your age, overall health, lifestyle risk factors, and family history of disease. Counseling  Your health care provider may ask you questions about your: Alcohol use. Tobacco use. Drug use. Emotional well-being. Home and relationship well-being. Sexual activity. Eating habits. History of falls. Memory and ability to understand (cognition). Work and work Astronomer. Reproductive health. Screening  You may have the following tests or measurements: Height, weight, and BMI. Blood pressure. Lipid and cholesterol levels. These may be checked every 5 years, or more frequently if you are over 68 years old. Skin check. Lung cancer screening. You may have this screening every year starting at age 67 if you have a 30-pack-year history of smoking and currently smoke or have quit within the past 15 years. Fecal occult blood test (FOBT) of the stool. You may have this test every year starting at age 44. Flexible sigmoidoscopy or colonoscopy. You may have a sigmoidoscopy every 5 years or a colonoscopy every 10 years starting at age 70. Hepatitis C blood test. Hepatitis B blood test. Sexually transmitted disease (STD) testing. Diabetes screening. This is done by checking your blood sugar (glucose) after you have not eaten for a while (fasting). You may have this done every 1-3 years. Bone density scan. This is done to screen for osteoporosis. You may have this done starting at age 32. Mammogram. This may be done every 1-2 years. Talk to your health care provider about how often you should have regular mammograms. Talk with your health care provider about your test results, treatment options, and if necessary, the need for more tests. Vaccines  Your health care provider may recommend certain vaccines, such as: Influenza vaccine. This is recommended every year.  Tetanus, diphtheria, and acellular pertussis (Tdap, Td) vaccine. You may need a Td booster every 10 years. Zoster  vaccine. You may need this after age 14. Pneumococcal 13-valent conjugate (PCV13) vaccine. One dose is recommended after age 70. Pneumococcal polysaccharide (PPSV23) vaccine. One dose is recommended after age 40. Talk to your health care provider about which screenings and vaccines you need and how often you need them. This information is not intended to replace advice given to you by your health care provider. Make sure you discuss any questions you have with your health care provider. Document Released: 12/08/2015 Document Revised: 07/31/2016 Document Reviewed: 09/12/2015 Elsevier Interactive Patient Education  2017 [employer] Prevention in the Home Falls can cause injuries. They can happen to people of all ages. There are many things you can do to make your home safe and to help prevent falls. What can I do on the outside of my home? Regularly fix the edges of walkways and driveways and fix any cracks. Remove anything that might make you trip as you walk through a door, such as a raised step or threshold. Trim any bushes or trees on the path to your home. Use bright outdoor lighting. Clear any walking paths of anything that might make someone trip, such as rocks or tools. Regularly check to see if handrails are loose or broken. Make sure that both sides of any steps have handrails. Any raised decks and porches should have guardrails on the edges. Have any leaves, snow, or ice cleared regularly. Use sand or salt on walking paths during winter. Clean up any spills in your garage right away. This includes oil or grease spills. What can I do in the bathroom? Use night lights. Install grab bars by the toilet and in the tub and shower. Do not use towel bars as grab bars. Use non-skid mats or decals in the tub or shower. If you need to sit down in the shower, use a plastic, non-slip stool. Keep the floor dry. Clean up any water that spills on the floor as soon as it happens. Remove  soap buildup in the tub or shower regularly. Attach bath mats securely with double-sided non-slip rug tape. Do not have throw rugs and other things on the floor that can make you trip. What can I do in the bedroom? Use night lights. Make sure that you have a light by your bed that is easy to reach. Do not use any sheets or blankets that are too big for your bed. They should not hang down onto the floor. Have a firm chair that has side arms. You can use this for support while you get dressed. Do not have throw rugs and other things on the floor that can make you trip. What can I do in the kitchen? Clean up any spills right away. Avoid walking on wet floors. Keep items that you use a lot in easy-to-reach places. If you need to reach something above you, use a strong step stool that has a grab bar. Keep electrical cords out of the way. Do not use floor polish or wax that makes floors slippery. If you must use wax, use non-skid floor wax. Do not have throw rugs and other things on the floor that can make you trip. What can I do with my stairs? Do not leave any items on the stairs. Make sure that there are handrails on both sides of the stairs and use them. Fix handrails that are broken or loose.  Make sure that handrails are as long as the stairways. Check any carpeting to make sure that it is firmly attached to the stairs. Fix any carpet that is loose or worn. Avoid having throw rugs at the top or bottom of the stairs. If you do have throw rugs, attach them to the floor with carpet tape. Make sure that you have a light switch at the top of the stairs and the bottom of the stairs. If you do not have them, ask someone to add them for you. What else can I do to help prevent falls? Wear shoes that: Do not have high heels. Have rubber bottoms. Are comfortable and fit you well. Are closed at the toe. Do not wear sandals. If you use a stepladder: Make sure that it is fully opened. Do not climb a  closed stepladder. Make sure that both sides of the stepladder are locked into place. Ask someone to hold it for you, if possible. Clearly mark and make sure that you can see: Any grab bars or handrails. First and last steps. Where the edge of each step is. Use tools that help you move around (mobility aids) if they are needed. These include: Canes. Walkers. Scooters. Crutches. Turn on the lights when you go into a dark area. Replace any light bulbs as soon as they burn out. Set up your furniture so you have a clear path. Avoid moving your furniture around. If any of your floors are uneven, fix them. If there are any pets around you, be aware of where they are. Review your medicines with your doctor. Some medicines can make you feel dizzy. This can increase your chance of falling. Ask your doctor what other things that you can do to help prevent falls. This information is not intended to replace advice given to you by your health care provider. Make sure you discuss any questions you have with your health care provider. Document Released: 09/07/2009 Document Revised: 04/18/2016 Document Reviewed: 12/16/2014 Elsevier Interactive Patient Education  2017 [employer].

## 2022-02-07 ENCOUNTER — Other Ambulatory Visit (HOSPITAL_COMMUNITY): Payer: Self-pay

## 2022-02-07 ENCOUNTER — Telehealth: Payer: Self-pay | Admitting: Primary Care

## 2022-02-07 MED ORDER — ATORVASTATIN CALCIUM 40 MG PO TABS
40.0000 mg | ORAL_TABLET | Freq: Every evening | ORAL | 0 refills | Status: DC
Start: 2022-02-07 — End: 2022-05-10
  Filled 2022-02-07: qty 90, 90d supply, fill #0

## 2022-02-07 MED ORDER — LISINOPRIL 10 MG PO TABS
10.0000 mg | ORAL_TABLET | Freq: Two times a day (BID) | ORAL | 0 refills | Status: DC
  Filled 2022-02-07: qty 180, 90d supply, fill #0

## 2022-02-08 NOTE — Telephone Encounter (Signed)
Patient is calling to follow up on refill request.  Please advise.

## 2022-02-11 ENCOUNTER — Other Ambulatory Visit: Payer: PPO

## 2022-02-11 ENCOUNTER — Other Ambulatory Visit (HOSPITAL_COMMUNITY): Payer: Self-pay

## 2022-02-11 NOTE — Telephone Encounter (Signed)
Have her cancel the lab appt for 03/21, but okay to come in on 03/23 if feeling better. Sorry to hear that she is not feeling well! ?

## 2022-02-11 NOTE — Telephone Encounter (Signed)
Called patient and was advised that she has gotten her refill and to disregard the request for the refill. ? ?Patient stated that she has an appointment scheduled tomorrow 02/12/22 for CPE labs but not sure if she should postpone them. Patient stated that she has a CPE scheduled Thursday 02/14/22. Patient stated that she has had vomiting and diarrhea for the past 3 days. Patient stated that she did a covid test Saturday when she started feeling bad and it was negative. Patient stated that she has had a headache and fever. Patient was offered a virtual visit today with Audria Nine NP which she declined stating that she was feeling a little better today and wait and see how she feels tomorrow. Patient wants to know if she should hold off and her appointments this week with her being sick. ?

## 2022-02-11 NOTE — Telephone Encounter (Signed)
Called patient cancelled lab appointment. She will let us know how she is feeling closer to her office visit date.  ?

## 2022-02-12 ENCOUNTER — Other Ambulatory Visit: Payer: PPO

## 2022-02-14 ENCOUNTER — Encounter: Payer: PPO | Admitting: Primary Care

## 2022-03-07 ENCOUNTER — Encounter: Payer: Self-pay | Admitting: Primary Care

## 2022-03-07 ENCOUNTER — Ambulatory Visit (INDEPENDENT_AMBULATORY_CARE_PROVIDER_SITE_OTHER): Payer: PPO | Admitting: Primary Care

## 2022-03-07 VITALS — BP 136/84 | HR 73 | Temp 98.7°F | Ht 65.0 in | Wt 177.0 lb

## 2022-03-07 DIAGNOSIS — I10 Essential (primary) hypertension: Secondary | ICD-10-CM | POA: Diagnosis not present

## 2022-03-07 DIAGNOSIS — E1165 Type 2 diabetes mellitus with hyperglycemia: Secondary | ICD-10-CM | POA: Diagnosis not present

## 2022-03-07 DIAGNOSIS — E2839 Other primary ovarian failure: Secondary | ICD-10-CM

## 2022-03-07 DIAGNOSIS — Z0000 Encounter for general adult medical examination without abnormal findings: Secondary | ICD-10-CM | POA: Diagnosis not present

## 2022-03-07 DIAGNOSIS — E785 Hyperlipidemia, unspecified: Secondary | ICD-10-CM | POA: Diagnosis not present

## 2022-03-07 DIAGNOSIS — Z8673 Personal history of transient ischemic attack (TIA), and cerebral infarction without residual deficits: Secondary | ICD-10-CM | POA: Diagnosis not present

## 2022-03-07 DIAGNOSIS — Z1231 Encounter for screening mammogram for malignant neoplasm of breast: Secondary | ICD-10-CM

## 2022-03-07 LAB — MICROALBUMIN / CREATININE URINE RATIO
Creatinine,U: 90.1 mg/dL
Microalb Creat Ratio: 4.4 mg/g (ref 0.0–30.0)
Microalb, Ur: 4 mg/dL — ABNORMAL HIGH (ref 0.0–1.9)

## 2022-03-07 LAB — LIPID PANEL
Cholesterol: 132 mg/dL (ref 0–200)
HDL: 41.7 mg/dL (ref >39.00)
LDL Cholesterol: 78 mg/dL (ref 0–99)
NonHDL: 90.55
Total CHOL/HDL Ratio: 3
Triglycerides: 65 mg/dL (ref 0.0–149.0)
VLDL: 13 mg/dL (ref 0.0–40.0)

## 2022-03-07 LAB — COMPREHENSIVE METABOLIC PANEL
ALT: 14 U/L (ref 0–35)
AST: 17 U/L (ref 0–37)
Albumin: 4.3 g/dL (ref 3.5–5.2)
Alkaline Phosphatase: 71 U/L (ref 39–117)
BUN: 19 mg/dL (ref 6–23)
CO2: 26 mEq/L (ref 19–32)
Calcium: 9.3 mg/dL (ref 8.4–10.5)
Chloride: 105 mEq/L (ref 96–112)
Creatinine, Ser: 0.81 mg/dL (ref 0.40–1.20)
GFR: 73.36 mL/min (ref >60.00)
Glucose, Bld: 119 mg/dL — ABNORMAL HIGH (ref 70–99)
Potassium: 4.3 mEq/L (ref 3.5–5.1)
Sodium: 139 mEq/L (ref 135–145)
Total Bilirubin: 0.7 mg/dL (ref 0.2–1.2)
Total Protein: 6.8 g/dL (ref 6.0–8.3)

## 2022-03-07 LAB — CBC
HCT: 39.5 % (ref 36.0–46.0)
Hemoglobin: 13.4 g/dL (ref 12.0–15.0)
MCHC: 33.9 g/dL (ref 30.0–36.0)
MCV: 91.1 fl (ref 78.0–100.0)
Platelets: 175 10*3/uL (ref 150.0–400.0)
RBC: 4.34 Mil/uL (ref 3.87–5.11)
RDW: 13.5 % (ref 11.5–15.5)
WBC: 4.8 10*3/uL (ref 4.0–10.5)

## 2022-03-07 LAB — HEMOGLOBIN A1C: Hgb A1c MFr Bld: 6.6 % — ABNORMAL HIGH (ref 4.6–6.5)

## 2022-03-07 NOTE — Progress Notes (Signed)
? ?Subjective:  ? ? Patient ID: CIENA Veronica Andrade, female    DOB: 1951/04/24, 71 y.o.   MRN: [id number] ? ?HPI ? ?Veronica Andrade is a very pleasant 71 y.o. female who presents today for complete physical and follow up of chronic conditions. ? ?Immunizations: ?-Tetanus: 2016 ?-Influenza: Due next season ?-Covid-19: 2 vaccines ?-Shingles: Never completed ?-Pneumonia: Prevnar 13 in 2018, Pneumovax 23 in 2019 ? ?Diet: Fair diet.  ?Exercise: No regular exercise. ? ?Eye exam: Completes annually  ?Dental exam: Completes semi-annually  ? ?Mammogram: Completed in 2017, patient declined last year. ?Colonoscopy: Completed Cologuard in 2019 which was positive.  At that time she declined colonoscopy. ?Dexa: Completed years ago, declined last year. ? ? ?BP Readings from Last 3 Encounters:  ?03/07/22 136/84  ?01/26/21 (!) 130/100  ?10/14/19 (!) 148/98  ? ? ? ? ? ? ? ? ?Review of Systems  ?Constitutional:  Negative for unexpected weight change.  ?HENT:  Negative for rhinorrhea.   ?Eyes:  Negative for visual disturbance.  ?Respiratory:  Negative for cough and shortness of breath.   ?Cardiovascular:  Negative for chest pain.  ?Gastrointestinal:  Negative for constipation and diarrhea.  ?Genitourinary:  Negative for difficulty urinating.  ?Musculoskeletal:  Negative for arthralgias and myalgias.  ?Skin:  Negative for rash.  ?Allergic/Immunologic: Negative for environmental allergies.  ?Neurological:  Negative for dizziness and headaches.  ? ?   ? ? ?Past Medical History:  ?Diagnosis Date  ? Cerebrovascular accident (CVA) due to thrombosis of left posterior cerebral artery (HCC) 04/23/2016  ? Hyperlipidemia   ? Hypertension   ? Stroke [facility])   ? ? ?Social History  ? ?Socioeconomic History  ? Marital status: Married  ?  Spouse name: Not on file  ? Number of children: Not on file  ? Years of education: Not on file  ? Highest education level: Not on file  ?Occupational History  ? Not on file  ?Tobacco Use  ? Smoking status: Never  ?  Smokeless tobacco: Never  ?Vaping Use  ? Vaping Use: Never used  ?Substance and Sexual Activity  ? Alcohol use: No  ?  Alcohol/week: 0.0 standard drinks  ? Drug use: No  ? Sexual activity: Not on file  ?Other Topics Concern  ? Not on file  ?Social History Narrative  ? Married.  ? 2 children, 4 g.children  ? Works at [employer], Chief of Staff.    ? Enjoys spending time in [place], camping, spending time with family.  ? ?Social Determinants of Health  ? ?Financial Resource Strain: Low Risk   ? Difficulty of Paying Living Expenses: Not hard at all  ?Food Insecurity: No Food Insecurity  ? Worried About Programme researcher, broadcasting/film/video in the Last Year: Never true  ? Ran Out of Food in the Last Year: Never true  ?Transportation Needs: No Transportation Needs  ? Lack of Transportation (Medical): No  ? Lack of Transportation (Non-Medical): No  ?Physical Activity: Insufficiently Active  ? Days of Exercise per Week: 4 days  ? Minutes of Exercise per Session: 30 min  ?Stress: No Stress Concern Present  ? Feeling of Stress : Only a little  ?Social Connections: Socially Integrated  ? Frequency of Communication with Friends and Family: More than three times a week  ? Frequency of Social Gatherings with Friends and Family: More than three times a week  ? Attends Religious Services: More than 4 times per year  ? Active Member of Clubs or Organizations: Yes  ? Attends  Club or Organization Meetings: More than 4 times per year  ? Marital Status: Married  ?Intimate Partner Violence: Not At Risk  ? Fear of Current or Ex-Partner: No  ? Emotionally Abused: No  ? Physically Abused: No  ? Sexually Abused: No  ? ? ?Past Surgical History:  ?Procedure Laterality Date  ? BLADDER SURGERY    ? Bladder Tack  ? KNEE ARTHROSCOPY Left   ? TUBAL LIGATION    ? ? ?Family History  ?Problem Relation Age of Onset  ? Stroke Father   ? Diabetes Father   ? Hypertension Father   ? Arrhythmia Mother   ? Hypertension Mother   ? Hypertension Brother   ? ? ?Allergies  ?Allergen  Reactions  ? Codeine Nausea Only and Other (See Comments)  ?  Headaches also  ? Morphine Nausea Only and Other (See Comments)  ?  Headaches also  ? Hydrochlorothiazide Nausea And Vomiting  ? ? ?Current Outpatient Medications on File Prior to Visit  ?Medication Sig Dispense Refill  ? aspirin 325 MG tablet Take 1 tablet (325 mg total) by mouth daily. 60 tablet 0  ? atorvastatin (LIPITOR) 40 MG tablet Take 1 tablet (40 mg total) by mouth every evening for cholesterol. Office visit required for further refills. 90 tablet 0  ? cholecalciferol (VITAMIN D) 1000 units tablet Take 1,000 Units by mouth daily.    ? lisinopril (ZESTRIL) 10 MG tablet Take 1 tablet (10 mg total) by mouth 2 (two) times daily for blood pressure. 180 tablet 0  ? Multiple Vitamins-Minerals (MULTIVITAMIN GUMMIES ADULTS) CHEW Chew 2 each by mouth daily.    ? vitamin B-12 (CYANOCOBALAMIN) 1000 MCG tablet Take 1,000 mcg by mouth daily.    ? Zinc 25 MG TABS Take 25 mg by mouth daily.    ? ?No current facility-administered medications on file prior to visit.  ? ? ?BP 136/84   Pulse 73   Temp 98.7 ?F (37.1 ?C) (Oral)   Ht 5\' 5"  (1.651 m)   Wt 177 lb (80.3 kg)   SpO2 96%   BMI 29.45 kg/m?  ?Objective:  ? Physical Exam ?HENT:  ?   Right Ear: Tympanic membrane and ear canal normal.  ?   Left Ear: Tympanic membrane and ear canal normal.  ?   Nose: Nose normal.  ?Eyes:  ?   Conjunctiva/sclera: Conjunctivae normal.  ?   Pupils: Pupils are equal, round, and reactive to light.  ?Neck:  ?   Thyroid: No thyromegaly.  ?Cardiovascular:  ?   Rate and Rhythm: Normal rate and regular rhythm.  ?   Heart sounds: No murmur heard. ?Pulmonary:  ?   Effort: Pulmonary effort is normal.  ?   Breath sounds: Normal breath sounds. No rales.  ?Abdominal:  ?   General: Bowel sounds are normal.  ?   Palpations: Abdomen is soft.  ?   Tenderness: There is no abdominal tenderness.  ?Musculoskeletal:     ?   General: Normal range of motion.  ?   Cervical back: Neck supple.   ?Lymphadenopathy:  ?   Cervical: No cervical adenopathy.  ?Skin: ?   General: Skin is warm and dry.  ?   Findings: No rash.  ?Neurological:  ?   Mental Status: She is alert and oriented to person, place, and time.  ?   Cranial Nerves: No cranial nerve deficit.  ?   Deep Tendon Reflexes: Reflexes are normal and symmetric.  ?Psychiatric:     ?  Mood and Affect: Mood normal.  ? ? ? ? ? ?   ?Assessment & Plan:  ? ? ? ? ?This visit occurred during the SARS-CoV-2 public health emergency.  Safety protocols were in place, including screening questions prior to the visit, additional usage of staff PPE, and extensive cleaning of exam room while observing appropriate contact time as indicated for disinfecting solutions.  ?

## 2022-03-07 NOTE — Patient Instructions (Signed)
Stop by the lab prior to leaving today. I will notify you of your results once received.  ? ?Call the [facility] to schedule your mammogram and bone density scan.  ? ?Complete the Shingrix vaccines at the pharmacy. ? ?It was a pleasure to see you today! ? ?Preventive Care 82 Years and Older, Female ?Preventive care refers to lifestyle choices and visits with your health care provider that can promote health and wellness. Preventive care visits are also called wellness exams. ?What can I expect for my preventive care visit? ?Counseling ?Your health care provider may ask you questions about your: ?Medical history, including: ?Past medical problems. ?Family medical history. ?Pregnancy and menstrual history. ?History of falls. ?Current health, including: ?Memory and ability to understand (cognition). ?Emotional well-being. ?Home life and relationship well-being. ?Sexual activity and sexual health. ?Lifestyle, including: ?Alcohol, nicotine or tobacco, and drug use. ?Access to firearms. ?Diet, exercise, and sleep habits. ?Work and work Statistician. ?Sunscreen use. ?Safety issues such as seatbelt and bike helmet use. ?Physical exam ?Your health care provider will check your: ?Height and weight. These may be used to calculate your BMI (body mass index). BMI is a measurement that tells if you are at a healthy weight. ?Waist circumference. This measures the distance around your waistline. This measurement also tells if you are at a healthy weight and may help predict your risk of certain diseases, such as type 2 diabetes and high blood pressure. ?Heart rate and blood pressure. ?Body temperature. ?Skin for abnormal spots. ?What immunizations do I need? ?Vaccines are usually given at various ages, according to a schedule. Your health care provider will recommend vaccines for you based on your age, medical history, and lifestyle or other factors, such as travel or where you work. ?What tests do I need? ?Screening ?Your health  care provider may recommend screening tests for certain conditions. This may include: ?Lipid and cholesterol levels. ?Hepatitis C test. ?Hepatitis B test. ?HIV (human immunodeficiency virus) test. ?STI (sexually transmitted infection) testing, if you are at risk. ?Lung cancer screening. ?Colorectal cancer screening. ?Diabetes screening. This is done by checking your blood sugar (glucose) after you have not eaten for a while (fasting). ?Mammogram. Talk with your health care provider about how often you should have regular mammograms. ?BRCA-related cancer screening. This may be done if you have a family history of breast, ovarian, tubal, or peritoneal cancers. ?Bone density scan. This is done to screen for osteoporosis. ?Talk with your health care provider about your test results, treatment options, and if necessary, the need for more tests. ?Follow these instructions at home: ?Eating and drinking ? ?Eat a diet that includes fresh fruits and vegetables, whole grains, lean protein, and low-fat dairy products. Limit your intake of foods with high amounts of sugar, saturated fats, and salt. ?Take vitamin and mineral supplements as recommended by your health care provider. ?Do not drink alcohol if your health care provider tells you not to drink. ?If you drink alcohol: ?Limit how much you have to 0-1 drink a day. ?Know how much alcohol is in your drink. In the [place] one drink equals one 12 oz bottle of beer (355 mL), one 5 oz glass of wine (148 mL), or one 1? oz glass of hard liquor (44 mL). ?Lifestyle ?Brush your teeth every morning and night with fluoride toothpaste. Floss one time each day. ?Exercise for at least 30 minutes 5 or more days each week. ?Do not use any products that contain nicotine or tobacco. These products include cigarettes,  chewing tobacco, and vaping devices, such as e-cigarettes. If you need help quitting, ask your health care provider. ?Do not use drugs. ?If you are sexually active, practice safe  sex. Use a condom or other form of protection in order to prevent STIs. ?Take aspirin only as told by your health care provider. Make sure that you understand how much to take and what form to take. Work with your health care provider to find out whether it is safe and beneficial for you to take aspirin daily. ?Ask your health care provider if you need to take a cholesterol-lowering medicine (statin). ?Find healthy ways to manage stress, such as: ?Meditation, yoga, or listening to music. ?Journaling. ?Talking to a trusted person. ?Spending time with friends and family. ?Minimize exposure to UV radiation to reduce your risk of skin cancer. ?Safety ?Always wear your seat belt while driving or riding in a vehicle. ?Do not drive: ?If you have been drinking alcohol. Do not ride with someone who has been drinking. ?When you are tired or distracted. ?While texting. ?If you have been using any mind-altering substances or drugs. ?Wear a helmet and other protective equipment during sports activities. ?If you have firearms in your house, make sure you follow all gun safety procedures. ?What's next? ?Visit your health care provider once a year for an annual wellness visit. ?Ask your health care provider how often you should have your eyes and teeth checked. ?Stay up to date on all vaccines. ?This information is not intended to replace advice given to you by your health care provider. Make sure you discuss any questions you have with your health care provider. ?Document Revised: 05/09/2021 Document Reviewed: 05/09/2021 ?Elsevier Patient Education ? [place]. ? ?

## 2022-03-07 NOTE — Assessment & Plan Note (Signed)
Immunizations UTD.  ?Declines colonoscopy and further colon cancer screening despite recommendation.  ?Mammogram and bone density scan due, orders placed.  ? ?Discussed the importance of a healthy diet and regular exercise in order for weight loss, and to reduce the risk of further co-morbidity. ? ?Exam today stable. ?Labs pending. ?

## 2022-03-07 NOTE — Assessment & Plan Note (Addendum)
Controlled. ? ?Continue lisinopril 10 mg twice daily. ?CMP pending. ? ?She does have a mild cough with lisinopril, is considering switching to another option. Consider losartan 25 mg BID.  ?

## 2022-03-07 NOTE — Assessment & Plan Note (Signed)
Repeat lipid anel pending. ? ?Continue atorvastatin 40 mg daily. ?LDL goal is <70 ?

## 2022-03-07 NOTE — Assessment & Plan Note (Signed)
No new stroke symptoms. ? ?Continue BP control, lipid control. ?LDL goal of <70. ? ?Repeat lipid panel pending. ?Continue atorvastatin 40 mg daily. ?Continue aspirin 325 mg daily. ?

## 2022-03-07 NOTE — Assessment & Plan Note (Addendum)
New diagnosis as of one year ago with A1C of 6.6. unfortunately were never able to get in touch with her. ? ?Discussed her diagnosis today. Repeat A1C pending. ?Currently not on treatment.  ? ?Discussed the importance of a healthy diet and regular exercise in order for weight loss, and to reduce the risk of further co-morbidity. ? ?

## 2022-05-10 ENCOUNTER — Other Ambulatory Visit: Payer: Self-pay | Admitting: Primary Care

## 2022-05-10 ENCOUNTER — Other Ambulatory Visit (HOSPITAL_COMMUNITY): Payer: Self-pay

## 2022-05-10 DIAGNOSIS — I10 Essential (primary) hypertension: Secondary | ICD-10-CM

## 2022-05-10 DIAGNOSIS — E785 Hyperlipidemia, unspecified: Secondary | ICD-10-CM

## 2022-05-10 MED ORDER — ATORVASTATIN CALCIUM 80 MG PO TABS
80.0000 mg | ORAL_TABLET | Freq: Every day | ORAL | 0 refills | Status: DC
  Filled 2022-05-10: qty 90, 90d supply, fill #0

## 2022-05-10 MED ORDER — LISINOPRIL 10 MG PO TABS
10.0000 mg | ORAL_TABLET | Freq: Two times a day (BID) | ORAL | 0 refills | Status: DC
  Filled 2022-05-10: qty 180, 90d supply, fill #0

## 2022-05-10 NOTE — Telephone Encounter (Signed)
Patient called back to request refill. I seen where patient had not replied to your last message. I have reviewed lab results with her. She is ok with increasing the atorvastatin to 80 mg. Will call that refill in for 90 day as well as lisinopril as requested. Patient would like to hold off on starting anything for diabetes at this time. Will call office after she gets back from vacation next week to set up follow up to talk about that as well as reviewing cough that may be coming from lisinopril that you discussed at last visit.

## 2022-05-10 NOTE — Telephone Encounter (Signed)
Noted  

## 2022-08-19 ENCOUNTER — Other Ambulatory Visit (HOSPITAL_COMMUNITY): Payer: Self-pay

## 2022-08-19 ENCOUNTER — Other Ambulatory Visit: Payer: Self-pay | Admitting: Primary Care

## 2022-08-19 DIAGNOSIS — I10 Essential (primary) hypertension: Secondary | ICD-10-CM

## 2022-08-19 MED ORDER — LISINOPRIL 10 MG PO TABS
10.0000 mg | ORAL_TABLET | Freq: Two times a day (BID) | ORAL | 1 refills | Status: DC
  Filled 2022-08-19: qty 180, 90d supply, fill #0
  Filled 2022-12-02 (×3): qty 180, 90d supply, fill #1

## 2022-09-06 DIAGNOSIS — M25511 Pain in right shoulder: Secondary | ICD-10-CM | POA: Diagnosis not present

## 2022-10-28 DIAGNOSIS — M25511 Pain in right shoulder: Secondary | ICD-10-CM | POA: Diagnosis not present

## 2022-11-04 DIAGNOSIS — M25511 Pain in right shoulder: Secondary | ICD-10-CM | POA: Diagnosis not present

## 2022-11-06 ENCOUNTER — Other Ambulatory Visit (HOSPITAL_COMMUNITY): Payer: Self-pay

## 2022-11-06 DIAGNOSIS — M25511 Pain in right shoulder: Secondary | ICD-10-CM | POA: Diagnosis not present

## 2022-11-06 MED ORDER — DICLOFENAC SODIUM 1 % EX GEL
CUTANEOUS | 1 refills | Status: AC
  Filled 2022-11-06: qty 300, 90d supply, fill #0

## 2022-11-07 DIAGNOSIS — M25511 Pain in right shoulder: Secondary | ICD-10-CM | POA: Diagnosis not present

## 2022-11-27 DIAGNOSIS — M7501 Adhesive capsulitis of right shoulder: Secondary | ICD-10-CM | POA: Diagnosis not present

## 2022-11-27 DIAGNOSIS — M6281 Muscle weakness (generalized): Secondary | ICD-10-CM | POA: Diagnosis not present

## 2022-12-02 ENCOUNTER — Other Ambulatory Visit (HOSPITAL_COMMUNITY): Payer: Self-pay

## 2022-12-02 ENCOUNTER — Other Ambulatory Visit: Payer: Self-pay | Admitting: Primary Care

## 2022-12-02 ENCOUNTER — Other Ambulatory Visit: Payer: Self-pay

## 2022-12-02 DIAGNOSIS — E785 Hyperlipidemia, unspecified: Secondary | ICD-10-CM

## 2022-12-02 MED ORDER — ATORVASTATIN CALCIUM 80 MG PO TABS
80.0000 mg | ORAL_TABLET | Freq: Every day | ORAL | 0 refills | Status: DC
  Filled 2022-12-02: qty 90, 90d supply, fill #0

## 2022-12-02 NOTE — Telephone Encounter (Signed)
Patient is due for CPE/follow up in mid April 2024, this will be required prior to any further refills.  Please schedule.

## 2022-12-03 ENCOUNTER — Other Ambulatory Visit (HOSPITAL_COMMUNITY): Payer: Self-pay

## 2022-12-03 NOTE — Telephone Encounter (Signed)
LVM to call back and schedule  °

## 2022-12-09 DIAGNOSIS — M6281 Muscle weakness (generalized): Secondary | ICD-10-CM | POA: Diagnosis not present

## 2022-12-09 DIAGNOSIS — M7501 Adhesive capsulitis of right shoulder: Secondary | ICD-10-CM | POA: Diagnosis not present

## 2022-12-11 DIAGNOSIS — M7501 Adhesive capsulitis of right shoulder: Secondary | ICD-10-CM | POA: Diagnosis not present

## 2022-12-11 DIAGNOSIS — M6281 Muscle weakness (generalized): Secondary | ICD-10-CM | POA: Diagnosis not present

## 2022-12-16 DIAGNOSIS — M6281 Muscle weakness (generalized): Secondary | ICD-10-CM | POA: Diagnosis not present

## 2022-12-16 DIAGNOSIS — M7501 Adhesive capsulitis of right shoulder: Secondary | ICD-10-CM | POA: Diagnosis not present

## 2022-12-18 DIAGNOSIS — M7501 Adhesive capsulitis of right shoulder: Secondary | ICD-10-CM | POA: Diagnosis not present

## 2022-12-18 DIAGNOSIS — M6281 Muscle weakness (generalized): Secondary | ICD-10-CM | POA: Diagnosis not present

## 2023-01-01 DIAGNOSIS — M25511 Pain in right shoulder: Secondary | ICD-10-CM | POA: Diagnosis not present

## 2023-01-21 ENCOUNTER — Telehealth: Payer: Self-pay | Admitting: Primary Care

## 2023-01-21 NOTE — Telephone Encounter (Signed)
Called patient to schedule Medicare Annual Wellness Visit (AWV). Left message for patient to call back and schedule Medicare Annual Wellness Visit (AWV).  Last date of AWV: 02/02/2023  Please schedule an appointment at any time with [facility].  If any questions, please contact me at [phone].  Thank you ,  [place] Direct Dial: [phone]

## 2023-02-14 ENCOUNTER — Other Ambulatory Visit: Payer: Self-pay | Admitting: Primary Care

## 2023-02-14 DIAGNOSIS — Z1231 Encounter for screening mammogram for malignant neoplasm of breast: Secondary | ICD-10-CM

## 2023-03-12 ENCOUNTER — Other Ambulatory Visit: Payer: Self-pay | Admitting: Primary Care

## 2023-03-12 ENCOUNTER — Other Ambulatory Visit (HOSPITAL_COMMUNITY): Payer: Self-pay

## 2023-03-12 DIAGNOSIS — I10 Essential (primary) hypertension: Secondary | ICD-10-CM

## 2023-03-12 MED ORDER — LISINOPRIL 10 MG PO TABS
10.0000 mg | ORAL_TABLET | Freq: Two times a day (BID) | ORAL | 0 refills | Status: DC
  Filled 2023-03-12: qty 180, 90d supply, fill #0

## 2023-03-13 ENCOUNTER — Other Ambulatory Visit (HOSPITAL_COMMUNITY): Payer: Self-pay

## 2023-04-03 ENCOUNTER — Other Ambulatory Visit (HOSPITAL_BASED_OUTPATIENT_CLINIC_OR_DEPARTMENT_OTHER): Payer: Self-pay

## 2023-04-03 ENCOUNTER — Other Ambulatory Visit (HOSPITAL_COMMUNITY): Payer: Self-pay

## 2023-04-03 DIAGNOSIS — D485 Neoplasm of uncertain behavior of skin: Secondary | ICD-10-CM | POA: Diagnosis not present

## 2023-04-03 DIAGNOSIS — L989 Disorder of the skin and subcutaneous tissue, unspecified: Secondary | ICD-10-CM | POA: Diagnosis not present

## 2023-04-03 MED ORDER — ERYTHROMYCIN 5 MG/GM OP OINT
1.0000 | TOPICAL_OINTMENT | Freq: Three times a day (TID) | OPHTHALMIC | 3 refills | Status: DC
  Filled 2023-04-03 (×2): qty 3.5, 7d supply, fill #0

## 2023-04-09 ENCOUNTER — Encounter: Payer: PPO | Admitting: Primary Care

## 2023-04-30 ENCOUNTER — Encounter: Payer: PPO | Admitting: Primary Care

## 2023-05-08 ENCOUNTER — Encounter: Payer: Self-pay | Admitting: Primary Care

## 2023-05-08 ENCOUNTER — Ambulatory Visit (INDEPENDENT_AMBULATORY_CARE_PROVIDER_SITE_OTHER): Payer: PPO | Admitting: Primary Care

## 2023-05-08 VITALS — BP 166/98 | HR 75 | Temp 97.3°F | Ht 65.0 in | Wt 188.0 lb

## 2023-05-08 DIAGNOSIS — Z8673 Personal history of transient ischemic attack (TIA), and cerebral infarction without residual deficits: Secondary | ICD-10-CM | POA: Diagnosis not present

## 2023-05-08 DIAGNOSIS — E785 Hyperlipidemia, unspecified: Secondary | ICD-10-CM

## 2023-05-08 DIAGNOSIS — E2839 Other primary ovarian failure: Secondary | ICD-10-CM

## 2023-05-08 DIAGNOSIS — Z1231 Encounter for screening mammogram for malignant neoplasm of breast: Secondary | ICD-10-CM

## 2023-05-08 DIAGNOSIS — I10 Essential (primary) hypertension: Secondary | ICD-10-CM | POA: Diagnosis not present

## 2023-05-08 DIAGNOSIS — Z0000 Encounter for general adult medical examination without abnormal findings: Secondary | ICD-10-CM

## 2023-05-08 DIAGNOSIS — E1165 Type 2 diabetes mellitus with hyperglycemia: Secondary | ICD-10-CM

## 2023-05-08 DIAGNOSIS — Z1211 Encounter for screening for malignant neoplasm of colon: Secondary | ICD-10-CM

## 2023-05-08 LAB — LIPID PANEL
Cholesterol: 137 mg/dL (ref 0–200)
HDL: 37.5 mg/dL — ABNORMAL LOW (ref >39.00)
LDL Cholesterol: 67 mg/dL (ref 0–99)
NonHDL: 99.53
Total CHOL/HDL Ratio: 4
Triglycerides: 161 mg/dL — ABNORMAL HIGH (ref 0.0–149.0)
VLDL: 32.2 mg/dL (ref 0.0–40.0)

## 2023-05-08 LAB — COMPREHENSIVE METABOLIC PANEL
ALT: 16 U/L (ref 0–35)
AST: 19 U/L (ref 0–37)
Albumin: 4.4 g/dL (ref 3.5–5.2)
Alkaline Phosphatase: 77 U/L (ref 39–117)
BUN: 15 mg/dL (ref 6–23)
CO2: 28 mEq/L (ref 19–32)
Calcium: 9.3 mg/dL (ref 8.4–10.5)
Chloride: 104 mEq/L (ref 96–112)
Creatinine, Ser: 0.81 mg/dL (ref 0.40–1.20)
GFR: 72.77 mL/min (ref >60.00)
Glucose, Bld: 123 mg/dL — ABNORMAL HIGH (ref 70–99)
Potassium: 4.1 mEq/L (ref 3.5–5.1)
Sodium: 140 mEq/L (ref 135–145)
Total Bilirubin: 0.6 mg/dL (ref 0.2–1.2)
Total Protein: 7.4 g/dL (ref 6.0–8.3)

## 2023-05-08 LAB — MICROALBUMIN / CREATININE URINE RATIO
Creatinine,U: 57 mg/dL
Microalb Creat Ratio: 12.3 mg/g (ref 0.0–30.0)
Microalb, Ur: 7 mg/dL — ABNORMAL HIGH (ref 0.0–1.9)

## 2023-05-08 LAB — HEMOGLOBIN A1C: Hgb A1c MFr Bld: 6.9 % — ABNORMAL HIGH (ref 4.6–6.5)

## 2023-05-08 NOTE — Assessment & Plan Note (Signed)
Immunizations UTD. Mammogram and bone density due, orders placed. Colonoscopy overdue, she declines colonoscopy but opts for Cologuard.  Discussed the importance of a healthy diet and regular exercise in order for weight loss, and to reduce the risk of further co-morbidity.  Exam stable. Labs pending.  Follow up in 1 year for repeat physical.

## 2023-05-08 NOTE — Assessment & Plan Note (Signed)
Above goal today, also on recheck.  Home readings are mostly within the 120-130 range systolic, however she has gained 11 pounds since last year.  I have asked for her to consistently monitor her blood pressure daily for the next 2 weeks and send readings via MyChart. If blood pressure remains above goal, 140/90 or greater, increase lisinopril.  Continue lisinopril 10 mg twice daily. CMP pending.

## 2023-05-08 NOTE — Assessment & Plan Note (Addendum)
No new symptoms.  Continue BP control, A1C control, lipid control Continue aspirin 325 mg  daily.

## 2023-05-08 NOTE — Assessment & Plan Note (Signed)
She cannot tolerate myalgias with atorvastatin since dose increase to 80 mg.  Repeat lipid panel pending today. Will likely switch to Crestor.   Await results.

## 2023-05-08 NOTE — Assessment & Plan Note (Signed)
No recent A1C in > 1 year.  Continue off treatment for now. Repeat A1C pending.  Follow up in 6 months.

## 2023-05-08 NOTE — Progress Notes (Signed)
Subjective:    Patient ID: Veronica Andrade, female    DOB: November 15, 1951, 72 y.o.   MRN: [id number]  HPI  Veronica Andrade is a very pleasant 72 y.o. female who presents today for complete physical and follow up of chronic conditions.  Immunizations: -Tetanus: Completed in 2016 -Shingles: Completed series  -Pneumonia: Completed Prevnar 13 in 2018, Pneumovax 23 in 2019  Diet: Fair diet.  Exercise: Walking 2-3 times weekly, about 1 mile  Eye exam: Completes annually  Dental exam: Completes semi-annually    Mammogram: Completed last in 2017, declined last year and several years prior. Bone Density Scan: Completed years ago, declined last year, declines this year.  Colonoscopy: Completed Cologuard in 2019 which was positive, she never completed colonoscopy per recommendations.   BP Readings from Last 3 Encounters:  05/08/23 (!) 166/98  03/07/22 136/84  01/26/21 (!) 130/100   She is checking her BP on occasion and is getting readings of 129/84, 133/82, 149/90 (yesterday), 153/85 this morning. She has "white coat syndrome" as she feels anxious during all appointments.   Wt Readings from Last 3 Encounters:  05/08/23 188 lb (85.3 kg)  03/07/22 177 lb (80.3 kg)  02/01/22 180 lb (81.6 kg)      Review of Systems  Constitutional:  Negative for unexpected weight change.  HENT:  Negative for rhinorrhea.   Respiratory:  Negative for cough and shortness of breath.   Cardiovascular:  Negative for chest pain.  Gastrointestinal:  Negative for constipation and diarrhea.  Genitourinary:  Negative for difficulty urinating and menstrual problem.  Musculoskeletal:  Positive for arthralgias.  Skin:  Negative for rash.  Allergic/Immunologic: Negative for environmental allergies.  Neurological:  Negative for dizziness and headaches.  Psychiatric/Behavioral:  The patient is not nervous/anxious.          Past Medical History:  Diagnosis Date   Cerebrovascular accident (CVA) due to  thrombosis of left posterior cerebral artery (HCC) 04/23/2016   Hyperlipidemia    Hypertension    Stroke [facility])     Social History   Socioeconomic History   Marital status: Married    Spouse name: Not on file   Number of children: Not on file   Years of education: Not on file   Highest education level: Not on file  Occupational History   Not on file  Tobacco Use   Smoking status: Never   Smokeless tobacco: Never  Vaping Use   Vaping Use: Never used  Substance and Sexual Activity   Alcohol use: No    Alcohol/week: 0.0 standard drinks of alcohol   Drug use: No   Sexual activity: Not on file  Other Topics Concern   Not on file  Social History Narrative   Married.   2 children, 4 g.children   Works at [employer], Chief of Staff.     Enjoys spending time in [place], camping, spending time with family.   Social Determinants of Health   Financial Resource Strain: Low Risk  (02/01/2022)   Overall Financial Resource Strain (CARDIA)    Difficulty of Paying Living Expenses: Not hard at all  Food Insecurity: No Food Insecurity (02/01/2022)   Hunger Vital Sign    Worried About Running Out of Food in the Last Year: Never true    Ran Out of Food in the Last Year: Never true  Transportation Needs: No Transportation Needs (02/01/2022)   PRAPARE - Administrator, Civil Service (Medical): No    Lack of Transportation (Non-Medical): No  Physical Activity: Insufficiently Active (02/01/2022)   Exercise Vital Sign    Days of Exercise per Week: 4 days    Minutes of Exercise per Session: 30 min  Stress: No Stress Concern Present (02/01/2022)   [employer] of Occupational Health - Occupational Stress Questionnaire    Feeling of Stress : Only a little  Social Connections: Socially Integrated (02/01/2022)   Social Connection and Isolation Panel [NHANES]    Frequency of Communication with Friends and Family: More than three times a week    Frequency of Social Gatherings with Friends  and Family: More than three times a week    Attends Religious Services: More than 4 times per year    Active Member of [employer] or Organizations: Yes    Attends Engineer, structural: More than 4 times per year    Marital Status: Married  Catering manager Violence: Not At Risk (02/01/2022)   Humiliation, Afraid, Rape, and Kick questionnaire    Fear of Current or Ex-Partner: No    Emotionally Abused: No    Physically Abused: No    Sexually Abused: No    Past Surgical History:  Procedure Laterality Date   BLADDER SURGERY     Bladder Tack   KNEE ARTHROSCOPY Left    TUBAL LIGATION      Family History  Problem Relation Age of Onset   Stroke Father    Diabetes Father    Hypertension Father    Arrhythmia Mother    Hypertension Mother    Hypertension Brother     Allergies  Allergen Reactions   Codeine Nausea Only and Other (See Comments)    Headaches also   Morphine Nausea Only and Other (See Comments)    Headaches also   Hydrochlorothiazide Nausea And Vomiting    Current Outpatient Medications on File Prior to Visit  Medication Sig Dispense Refill   aspirin 325 MG tablet Take 1 tablet (325 mg total) by mouth daily. 60 tablet 0   atorvastatin (LIPITOR) 80 MG tablet Take 1 tablet (80 mg total) by mouth daily For cholesterol 90 tablet 0   cholecalciferol (VITAMIN D) 1000 units tablet Take 1,000 Units by mouth daily.     diclofenac Sodium (VOLTAREN) 1 % GEL Apply 1 gram to skin every 6 to 8 hours as needed for pain 300 g 1   lisinopril (ZESTRIL) 10 MG tablet Take 1 tablet (10 mg total) by mouth 2 (two) times daily. for blood pressure. 180 tablet 0   Multiple Vitamins-Minerals (MULTIVITAMIN GUMMIES ADULTS) CHEW Chew 2 each by mouth daily.     vitamin B-12 (CYANOCOBALAMIN) 1000 MCG tablet Take 1,000 mcg by mouth daily.     Zinc 25 MG TABS Take 25 mg by mouth daily.     erythromycin ophthalmic ointment Apply to incisions and lashes 3 times a day on the RIGHT eye for 2 weeks  (Patient not taking: Reported on 05/08/2023) 3.5 g 3   No current facility-administered medications on file prior to visit.    BP (!) 166/98   Pulse 75   Temp (!) 97.3 F (36.3 C) (Temporal)   Ht 5\' 5"  (1.651 m)   Wt 188 lb (85.3 kg)   SpO2 99%   BMI 31.28 kg/m  Objective:   Physical Exam HENT:     Right Ear: Tympanic membrane and ear canal normal.     Left Ear: Tympanic membrane and ear canal normal.     Nose: Nose normal.  Eyes:  Conjunctiva/sclera: Conjunctivae normal.     Pupils: Pupils are equal, round, and reactive to light.  Neck:     Thyroid: No thyromegaly.  Cardiovascular:     Rate and Rhythm: Normal rate and regular rhythm.     Heart sounds: No murmur heard. Pulmonary:     Effort: Pulmonary effort is normal.     Breath sounds: Normal breath sounds. No rales.  Abdominal:     General: Bowel sounds are normal.     Palpations: Abdomen is soft.     Tenderness: There is no abdominal tenderness.  Musculoskeletal:        General: Normal range of motion.     Cervical back: Neck supple.  Lymphadenopathy:     Cervical: No cervical adenopathy.  Skin:    General: Skin is warm and dry.     Findings: No rash.  Neurological:     Mental Status: She is alert and oriented to person, place, and time.     Cranial Nerves: No cranial nerve deficit.     Deep Tendon Reflexes: Reflexes are normal and symmetric.  Psychiatric:        Mood and Affect: Mood normal.           Assessment & Plan:  Preventative health care Assessment & Plan: Immunizations UTD. Mammogram and bone density due, orders placed. Colonoscopy overdue, she declines colonoscopy but opts for Cologuard.  Discussed the importance of a healthy diet and regular exercise in order for weight loss, and to reduce the risk of further co-morbidity.  Exam stable. Labs pending.  Follow up in 1 year for repeat physical.    Type 2 diabetes mellitus with hyperglycemia, without long-term current use of  insulin (HCC) Assessment & Plan: No recent A1C in > 1 year.  Continue off treatment for now. Repeat A1C pending.  Follow up in 6 months.  Orders: -     Microalbumin / creatinine urine ratio -     Hemoglobin A1c -     Comprehensive metabolic panel  History of CVA (cerebrovascular accident) Assessment & Plan: No new symptoms.  Continue BP control, A1C control, lipid control Continue aspirin 325 mg  daily.     Hyperlipidemia, unspecified hyperlipidemia type Assessment & Plan: She cannot tolerate myalgias with atorvastatin since dose increase to 80 mg.  Repeat lipid panel pending today. Will likely switch to Crestor.   Await results.   Orders: -     Lipid panel -     Comprehensive metabolic panel  Estrogen deficiency -     DG Bone Density; Future  Screening mammogram for breast cancer -     3D Screening Mammogram, Left and Right; Future  Screening for colon cancer -     Cologuard  Essential hypertension Assessment & Plan: Above goal today, also on recheck.  Home readings are mostly within the 120-130 range systolic, however she has gained 11 pounds since last year.  I have asked for her to consistently monitor her blood pressure daily for the next 2 weeks and send readings via MyChart. If blood pressure remains above goal, 140/90 or greater, increase lisinopril.  Continue lisinopril 10 mg twice daily. CMP pending.         Doreene Nest, NP

## 2023-05-08 NOTE — Patient Instructions (Signed)
Start monitoring your blood pressure daily, around the same time of day, for the next 2-3 weeks.  Ensure that you have rested for 30 minutes prior to checking your blood pressure.   Record your readings and send them to me via MyChart in 2 weeks.  They should run consistently at or above 140 on top and/or 90 on bottom.  Stop by the lab prior to leaving today. I will notify you of your results once received.   [facility] your mamm[facility] and bone density scan.   Complete the Cologuard kit as discussed.  Please schedule a follow up visit for 6 months for a diabetes check.  It was a pleasure to see you today!

## 2023-05-09 ENCOUNTER — Other Ambulatory Visit (HOSPITAL_COMMUNITY): Payer: Self-pay

## 2023-05-09 ENCOUNTER — Other Ambulatory Visit: Payer: Self-pay | Admitting: Primary Care

## 2023-05-09 DIAGNOSIS — E785 Hyperlipidemia, unspecified: Secondary | ICD-10-CM

## 2023-05-09 MED ORDER — ROSUVASTATIN CALCIUM 40 MG PO TABS
40.0000 mg | ORAL_TABLET | Freq: Every day | ORAL | 3 refills | Status: AC
Start: 2023-05-09 — End: ?
  Filled 2023-05-09 – 2023-06-11 (×2): qty 90, 90d supply, fill #0
  Filled 2023-09-18: qty 90, 90d supply, fill #1
  Filled 2024-01-07: qty 90, 90d supply, fill #2

## 2023-05-13 ENCOUNTER — Encounter: Payer: Self-pay | Admitting: Primary Care

## 2023-05-22 ENCOUNTER — Other Ambulatory Visit (HOSPITAL_COMMUNITY): Payer: Self-pay

## 2023-06-11 ENCOUNTER — Other Ambulatory Visit: Payer: Self-pay | Admitting: Primary Care

## 2023-06-11 ENCOUNTER — Other Ambulatory Visit: Payer: Self-pay

## 2023-06-11 ENCOUNTER — Other Ambulatory Visit (HOSPITAL_COMMUNITY): Payer: Self-pay

## 2023-06-11 DIAGNOSIS — I10 Essential (primary) hypertension: Secondary | ICD-10-CM

## 2023-06-11 MED ORDER — LISINOPRIL 10 MG PO TABS
10.0000 mg | ORAL_TABLET | Freq: Two times a day (BID) | ORAL | 2 refills | Status: DC
Start: 2023-06-11 — End: 2024-04-01
  Filled 2023-06-11: qty 180, 90d supply, fill #0
  Filled 2023-09-18: qty 180, 90d supply, fill #1
  Filled 2024-01-07: qty 180, 90d supply, fill #2

## 2023-06-12 ENCOUNTER — Other Ambulatory Visit (HOSPITAL_COMMUNITY): Payer: Self-pay

## 2023-06-13 ENCOUNTER — Other Ambulatory Visit (HOSPITAL_COMMUNITY): Payer: Self-pay

## 2023-06-24 ENCOUNTER — Encounter: Payer: Self-pay | Admitting: Pharmacist

## 2023-06-24 NOTE — Progress Notes (Signed)
Pharmacy Quality Measure Review  This patient is appearing on a report for being at risk of failing the adherence measure for hypertension (ACEi/ARB) medications this calendar year.   Medication: lisinopril 10 mg  Last fill date: 06/16/23 for 90 day supply  Insurance report not up to date. No action needed at this time  Catie Eppie Gibson, PharmD, BCACP, CPP Clinical Pharmacist [facility] [phone]

## 2023-07-10 ENCOUNTER — Encounter (INDEPENDENT_AMBULATORY_CARE_PROVIDER_SITE_OTHER): Payer: Self-pay

## 2023-08-20 ENCOUNTER — Ambulatory Visit: Payer: PPO | Admitting: Primary Care

## 2023-09-18 ENCOUNTER — Other Ambulatory Visit (HOSPITAL_COMMUNITY): Payer: Self-pay

## 2023-12-19 ENCOUNTER — Telehealth: Payer: Self-pay

## 2023-12-19 NOTE — Telephone Encounter (Signed)
Need to call patient and schedule 6 month follow up as requested at last visit. Also need to address HM that are open.

## 2023-12-24 NOTE — Telephone Encounter (Signed)
Left message to return call to our office.

## 2023-12-26 NOTE — Telephone Encounter (Signed)
 Left message to return call to our office.

## 2023-12-29 NOTE — Telephone Encounter (Signed)
Left patient message to return call to office to schedule f/u with Veronica Andrade

## 2024-01-05 ENCOUNTER — Telehealth: Payer: Self-pay | Admitting: Primary Care

## 2024-01-05 DIAGNOSIS — I10 Essential (primary) hypertension: Secondary | ICD-10-CM

## 2024-01-05 DIAGNOSIS — E1165 Type 2 diabetes mellitus with hyperglycemia: Secondary | ICD-10-CM

## 2024-01-05 DIAGNOSIS — E785 Hyperlipidemia, unspecified: Secondary | ICD-10-CM

## 2024-01-05 NOTE — Telephone Encounter (Signed)
 Orders placed.  Have her schedule a lab appointment 1 week before her appointment scheduled with me in June.

## 2024-01-05 NOTE — Addendum Note (Signed)
 Addended by: Alyaan Budzynski K on: 01/05/2024 06:00 PM   Modules accepted: Orders

## 2024-01-05 NOTE — Telephone Encounter (Signed)
 Copied from CRM [id number]. Topic: Clinical - Request for Lab/Test Order >> Jan 05, 2024 11:00 AM Veronica Andrade H wrote: Reason for CRM: Patient wants to have orders put in system for her annual appointment on 6/18, patient states she always comes in before physical to have labs done first.  Veronica Andrade [phone]

## 2024-01-06 NOTE — Telephone Encounter (Signed)
LVM for patient to c/b and schedule.

## 2024-01-07 ENCOUNTER — Other Ambulatory Visit (HOSPITAL_COMMUNITY): Payer: Self-pay

## 2024-02-03 DIAGNOSIS — H524 Presbyopia: Secondary | ICD-10-CM | POA: Diagnosis not present

## 2024-02-03 DIAGNOSIS — H2513 Age-related nuclear cataract, bilateral: Secondary | ICD-10-CM | POA: Diagnosis not present

## 2024-02-03 DIAGNOSIS — H52223 Regular astigmatism, bilateral: Secondary | ICD-10-CM | POA: Diagnosis not present

## 2024-02-03 DIAGNOSIS — H5203 Hypermetropia, bilateral: Secondary | ICD-10-CM | POA: Diagnosis not present

## 2024-02-10 DIAGNOSIS — B078 Other viral warts: Secondary | ICD-10-CM | POA: Diagnosis not present

## 2024-04-01 ENCOUNTER — Other Ambulatory Visit: Payer: Self-pay | Admitting: Primary Care

## 2024-04-01 ENCOUNTER — Other Ambulatory Visit (HOSPITAL_COMMUNITY): Payer: Self-pay

## 2024-04-01 DIAGNOSIS — I10 Essential (primary) hypertension: Secondary | ICD-10-CM

## 2024-04-02 ENCOUNTER — Other Ambulatory Visit (HOSPITAL_COMMUNITY): Payer: Self-pay

## 2024-04-02 MED ORDER — LISINOPRIL 10 MG PO TABS
10.0000 mg | ORAL_TABLET | Freq: Two times a day (BID) | ORAL | 0 refills | Status: DC
  Filled 2024-04-02: qty 180, 90d supply, fill #0

## 2024-04-26 DIAGNOSIS — M19011 Primary osteoarthritis, right shoulder: Secondary | ICD-10-CM | POA: Diagnosis not present

## 2024-05-04 ENCOUNTER — Other Ambulatory Visit: Payer: PPO

## 2024-05-05 ENCOUNTER — Other Ambulatory Visit

## 2024-05-05 DIAGNOSIS — E1165 Type 2 diabetes mellitus with hyperglycemia: Secondary | ICD-10-CM

## 2024-05-05 DIAGNOSIS — E785 Hyperlipidemia, unspecified: Secondary | ICD-10-CM

## 2024-05-05 DIAGNOSIS — I10 Essential (primary) hypertension: Secondary | ICD-10-CM

## 2024-05-05 NOTE — Addendum Note (Signed)
 Addended by: Darcella Earnest on: 05/05/2024 08:03 AM   Modules accepted: Orders

## 2024-05-06 ENCOUNTER — Ambulatory Visit: Payer: Self-pay | Admitting: Primary Care

## 2024-05-06 LAB — COMPREHENSIVE METABOLIC PANEL WITH GFR
AG Ratio: 1.7 (calc) (ref 1.0–2.5)
ALT: 26 U/L (ref 6–29)
AST: 27 U/L (ref 10–35)
Albumin: 4.5 g/dL (ref 3.6–5.1)
Alkaline phosphatase (APISO): 84 U/L (ref 37–153)
BUN: 18 mg/dL (ref 7–25)
CO2: 26 mmol/L (ref 20–32)
Calcium: 9.7 mg/dL (ref 8.6–10.4)
Chloride: 102 mmol/L (ref 98–110)
Creat: 0.72 mg/dL (ref 0.60–1.00)
Globulin: 2.6 g/dL (ref 1.9–3.7)
Glucose, Bld: 123 mg/dL — ABNORMAL HIGH (ref 65–99)
Potassium: 4.8 mmol/L (ref 3.5–5.3)
Sodium: 141 mmol/L (ref 135–146)
Total Bilirubin: 0.5 mg/dL (ref 0.2–1.2)
Total Protein: 7.1 g/dL (ref 6.1–8.1)
eGFR: 89 mL/min/{1.73_m2} (ref >=60)

## 2024-05-06 LAB — HEMOGLOBIN A1C
Hgb A1c MFr Bld: 7.2 % — ABNORMAL HIGH (ref <5.7)
Mean Plasma Glucose: 160 mg/dL
eAG (mmol/L): 8.9 mmol/L

## 2024-05-06 LAB — CBC
HCT: 43.6 % (ref 35.0–45.0)
Hemoglobin: 14.2 g/dL (ref 11.7–15.5)
MCH: 30.6 pg (ref 27.0–33.0)
MCHC: 32.6 g/dL (ref 32.0–36.0)
MCV: 94 fL (ref 80.0–100.0)
MPV: 10.1 fL (ref 7.5–12.5)
Platelets: 212 10*3/uL (ref 140–400)
RBC: 4.64 10*6/uL (ref 3.80–5.10)
RDW: 13.1 % (ref 11.0–15.0)
WBC: 7.6 10*3/uL (ref 3.8–10.8)

## 2024-05-06 LAB — LIPID PANEL
Cholesterol: 134 mg/dL (ref <200)
HDL: 53 mg/dL (ref >=50)
LDL Cholesterol (Calc): 65 mg/dL
Non-HDL Cholesterol (Calc): 81 mg/dL (ref <130)
Total CHOL/HDL Ratio: 2.5 (calc) (ref <5.0)
Triglycerides: 84 mg/dL (ref <150)

## 2024-05-06 LAB — MICROALBUMIN / CREATININE URINE RATIO
Creatinine, Urine: 69 mg/dL (ref 20–275)
Microalb Creat Ratio: 36 mg/g{creat} — ABNORMAL HIGH (ref <30)
Microalb, Ur: 2.5 mg/dL

## 2024-05-12 ENCOUNTER — Encounter: Payer: PPO | Admitting: Primary Care

## 2024-07-13 ENCOUNTER — Other Ambulatory Visit: Payer: Self-pay

## 2024-07-15 ENCOUNTER — Other Ambulatory Visit: Payer: Self-pay

## 2024-07-20 ENCOUNTER — Other Ambulatory Visit: Payer: Self-pay

## 2024-07-20 ENCOUNTER — Telehealth: Payer: Self-pay

## 2024-07-20 NOTE — Telephone Encounter (Signed)
 This patient has appeared in a report as failing or at risk of failing a medication adherence for cholesterol measure in 2025 fiscal year.   PDC on report: 0.53 (Rosuvastatin )  The ASCVD Risk score (Arnett DK, et al., 2019) failed to calculate for the following reasons:   Risk score cannot be calculated because patient has a medical history suggesting prior/existing ASCVD   Barriers to adherence identified: N/A, unable to reach patient.  Comments: Rosuvastatin  prescription on file is expired. Left voicemail with patient 07/13/24, sent MyChart Message 07/15/24, and left additional voicemail 07/20/2024.   Community pharmacist intervention?: Yes  [] Refilled targeted medication [] Enrolled in automatic refill [] Enrolled in mail delivery [] Enrolled in adherence therapy packaging (ATP) [] Enrolled in medication synchronization [x] Other: Left message with patient, unable to reach.  Clinical intervention indicated? No  [] Pharmacotherapy dose escalation  [] Pharmacotherapy dose decrease [] Initiation of new pharmacotherapy  [] Pharmacotherapy discontinued  [] Refill for targeted medication requested [] Other: N/A  CPP or PCP notified? No  Powell Gallus, PharmD, MPH PGY1 Community-Based Pharmacy Resident

## 2024-08-10 ENCOUNTER — Other Ambulatory Visit: Payer: Self-pay

## 2024-09-08 ENCOUNTER — Other Ambulatory Visit (HOSPITAL_COMMUNITY): Payer: Self-pay

## 2024-09-08 ENCOUNTER — Other Ambulatory Visit: Payer: Self-pay | Admitting: Primary Care

## 2024-09-08 DIAGNOSIS — I10 Essential (primary) hypertension: Secondary | ICD-10-CM

## 2024-09-08 NOTE — Telephone Encounter (Signed)
 Patient is overdue for CPE/follow up, this will be required prior to any further refills.  Please schedule, thank you!   Can you let me know once she's scheduled?

## 2024-09-09 ENCOUNTER — Encounter: Payer: Self-pay | Admitting: Pharmacist

## 2024-09-09 ENCOUNTER — Telehealth: Payer: Self-pay | Admitting: Primary Care

## 2024-09-09 NOTE — Telephone Encounter (Unsigned)
 Copied from CRM [id number]. Topic: Clinical - Medication Refill >> Sep 09, 2024  4:09 PM Alfonso HERO wrote: Medication: lisinopril  (ZESTRIL ) 10 MG tablet  Has the patient contacted their pharmacy? Yes (Agent: If no, request that the patient contact the pharmacy for the refill. If patient does not wish to contact the pharmacy document the reason why and proceed with request.) (Agent: If yes, when and what did the pharmacy advise?)  This is the patient's preferred pharmacy:  [place] - [facility] [address] [place] [address] Phone: [phone] Fax: [phone]  [facility] [address] [place], [place] - [address] AT [facility] [address] [place] [address] Phone: [phone] Fax: [phone]  [place] - [facility] [address] [place] [address] Phone: [phone] Fax: [phone]  Is this the correct pharmacy for this prescription? Yes If no, delete pharmacy and type the correct one.   Has the prescription been filled recently? Yes  Is the patient out of the medication? No  Has the patient been seen for an appointment in the last year OR does the patient have an upcoming appointment? Yes  Can we respond through MyChart? Yes  Agent: Please be advised that Rx refills may take up to 3 business days. We ask that you follow-up with your pharmacy.

## 2024-09-09 NOTE — Progress Notes (Signed)
 Pharmacy Quality Measure Review  This patient is appearing on a report for being at risk of failing the adherence measure for hypertension (ACEi/ARB) medications this calendar year.   Medication: lisinopril  10 mg Last fill date: 04/02/24 for 90 day supply  Refill several months overdue. Appears patient is overdue for PCP f/u for additional refills. Attempted to call patient, no answer.

## 2024-09-10 ENCOUNTER — Other Ambulatory Visit: Payer: Self-pay | Admitting: Primary Care

## 2024-09-10 ENCOUNTER — Other Ambulatory Visit (HOSPITAL_COMMUNITY): Payer: Self-pay

## 2024-09-10 DIAGNOSIS — I10 Essential (primary) hypertension: Secondary | ICD-10-CM

## 2024-09-10 MED ORDER — LISINOPRIL 10 MG PO TABS
10.0000 mg | ORAL_TABLET | Freq: Two times a day (BID) | ORAL | 0 refills | Status: DC
  Filled 2024-09-10: qty 60, 30d supply, fill #0

## 2024-09-20 ENCOUNTER — Other Ambulatory Visit (HOSPITAL_BASED_OUTPATIENT_CLINIC_OR_DEPARTMENT_OTHER): Payer: Self-pay

## 2024-10-05 ENCOUNTER — Ambulatory Visit: Admitting: Primary Care

## 2024-11-19 ENCOUNTER — Other Ambulatory Visit (HOSPITAL_COMMUNITY): Payer: Self-pay

## 2024-11-19 ENCOUNTER — Other Ambulatory Visit: Payer: Self-pay | Admitting: Primary Care

## 2024-11-19 DIAGNOSIS — I10 Essential (primary) hypertension: Secondary | ICD-10-CM

## 2024-11-22 ENCOUNTER — Other Ambulatory Visit (HOSPITAL_COMMUNITY): Payer: Self-pay

## 2024-11-22 ENCOUNTER — Other Ambulatory Visit: Payer: Self-pay | Admitting: Primary Care

## 2024-11-22 DIAGNOSIS — I10 Essential (primary) hypertension: Secondary | ICD-10-CM

## 2024-11-22 NOTE — Telephone Encounter (Unsigned)
 Copied from CRM [id number]. Topic: Clinical - Medication Refill >> Nov 22, 2024  2:35 PM Antony S wrote: Medication:  lisinopril  (ZESTRIL ) 10 MG tablet   Has the patient contacted their pharmacy? Yes (Agent: If no, request that the patient contact the pharmacy for the refill. If patient does not wish to contact the pharmacy document the reason why and proceed with request.) (Agent: If yes, when and what did the pharmacy advise?)  This is the patient's preferred pharmacy:  [place] [facility] [address], [address] [place] [address] Phone: [phone] Fax: [phone]   Is this the correct pharmacy for this prescription? Yes If no, delete pharmacy and type the correct one.   Has the prescription been filled recently? No  Is the patient out of the medication? Yes  Has the patient been seen for an appointment in the last year OR does the patient have an upcoming appointment? No  Can we respond through MyChart? No  Agent: Please be advised that Rx refills may take up to 3 business days. We ask that you follow-up with your pharmacy.

## 2024-11-23 ENCOUNTER — Other Ambulatory Visit (HOSPITAL_COMMUNITY): Payer: Self-pay

## 2024-11-23 MED ORDER — LISINOPRIL 10 MG PO TABS
10.0000 mg | ORAL_TABLET | Freq: Two times a day (BID) | ORAL | 0 refills | Status: DC
  Filled 2024-11-23: qty 30, 15d supply, fill #0

## 2024-12-08 ENCOUNTER — Encounter: Payer: Self-pay | Admitting: Primary Care

## 2024-12-08 ENCOUNTER — Ambulatory Visit: Payer: Self-pay | Admitting: Primary Care

## 2024-12-08 ENCOUNTER — Ambulatory Visit: Admitting: Primary Care

## 2024-12-08 VITALS — BP 138/88 | HR 69 | Temp 98.2°F | Ht 65.0 in | Wt 183.0 lb

## 2024-12-08 DIAGNOSIS — G479 Sleep disorder, unspecified: Secondary | ICD-10-CM

## 2024-12-08 DIAGNOSIS — Z1231 Encounter for screening mammogram for malignant neoplasm of breast: Secondary | ICD-10-CM

## 2024-12-08 DIAGNOSIS — E785 Hyperlipidemia, unspecified: Secondary | ICD-10-CM | POA: Diagnosis not present

## 2024-12-08 DIAGNOSIS — I10 Essential (primary) hypertension: Secondary | ICD-10-CM

## 2024-12-08 DIAGNOSIS — Z8673 Personal history of transient ischemic attack (TIA), and cerebral infarction without residual deficits: Secondary | ICD-10-CM

## 2024-12-08 DIAGNOSIS — Z0000 Encounter for general adult medical examination without abnormal findings: Secondary | ICD-10-CM

## 2024-12-08 DIAGNOSIS — E1165 Type 2 diabetes mellitus with hyperglycemia: Secondary | ICD-10-CM

## 2024-12-08 DIAGNOSIS — E2839 Other primary ovarian failure: Secondary | ICD-10-CM

## 2024-12-08 LAB — HEMOGLOBIN A1C: Hgb A1c MFr Bld: 7 % — ABNORMAL HIGH (ref 4.6–6.5)

## 2024-12-08 LAB — BASIC METABOLIC PANEL WITH GFR
BUN: 11 mg/dL (ref 6–23)
CO2: 28 meq/L (ref 19–32)
Calcium: 9.2 mg/dL (ref 8.4–10.5)
Chloride: 104 meq/L (ref 96–112)
Creatinine, Ser: 0.71 mg/dL (ref 0.40–1.20)
GFR: 84.28 mL/min
Glucose, Bld: 134 mg/dL — ABNORMAL HIGH (ref 70–99)
Potassium: 4.2 meq/L (ref 3.5–5.1)
Sodium: 139 meq/L (ref 135–145)

## 2024-12-08 NOTE — Assessment & Plan Note (Signed)
 Controlled.  Reviewed lipid panel from June 2025. Continue rosuvastatin  40 mg daily.

## 2024-12-08 NOTE — Patient Instructions (Signed)
Stop by the lab prior to leaving today. I will notify you of your results once received.   [facility] your [facility] and bone density scan.   It was a pleasure to see you today!   

## 2024-12-08 NOTE — Assessment & Plan Note (Addendum)
 Immunizations UTD.  Mammogram and bone density due, orders placed. Colonoscopy overdue, she continues to decline colonoscopy and cologuard  Discussed the importance of a healthy diet and regular exercise in order for weight loss, and to reduce the risk of further co-morbidity.  Exam stable. Labs reviewed and pending.  Follow up in 1 year for repeat physical.

## 2024-12-08 NOTE — Assessment & Plan Note (Addendum)
 LDL at goal of <70.  Continue rosuvastatin  40 mg daily Continue aspirin  325 mg daily.

## 2024-12-08 NOTE — Assessment & Plan Note (Signed)
Overall stable. Continue to monitor.  

## 2024-12-08 NOTE — Assessment & Plan Note (Signed)
 Recommended sleep study as symptoms are suspicious for sleep apnea, she kindly declines. She will try melatonin and update.

## 2024-12-08 NOTE — Assessment & Plan Note (Signed)
 Repeat A1C pending.   Continue off treatment.  Foot exam today.  She will schedule eye exam.

## 2024-12-08 NOTE — Progress Notes (Signed)
 "  Subjective:    Patient ID: Veronica Andrade, female    DOB: 11-23-1951, 74 y.o.   MRN: [id number]  Veronica Andrade is a very pleasant 74 y.o. female who presents today for complete physical and follow up of chronic conditions.  She would also like to discuss sleep disturbance. She will sleep about 4 hours per night. She does experience daytime tiredness and finds herself dozing off around 2 pm. She questions if she can take Melatonin. She does snore. She will wake during the night 2 times usually. She does continue to struggle with chronic right shoulder pain.   Immunizations: -Tetanus: Completed in 2016 -Influenza: Completed this season  -Shingles: Completed Shingrix series -Pneumonia: Completed last in 2019   Diet: Fair diet.  Exercise: No regular exercise.  Eye exam: Completed > 1 year ago  Dental exam: Completes semi-annually     Mammogram: Completed in 2017, declines each year Bone Density Scan: Completed years ago, declines each year  Colonoscopy: Completed Cologuard in 2019 which was positive, never completed colonoscopy despite recommendations.    BP Readings from Last 3 Encounters:  12/08/24 138/88  05/08/23 (!) 166/98  03/07/22 136/84     Review of Systems  Constitutional:  Negative for unexpected weight change.  HENT:  Negative for rhinorrhea.   Respiratory:  Negative for cough and shortness of breath.   Cardiovascular:  Negative for chest pain.  Gastrointestinal:  Negative for constipation and diarrhea.  Genitourinary:  Negative for difficulty urinating.  Musculoskeletal:  Positive for arthralgias.  Skin:  Negative for rash.  Allergic/Immunologic: Negative for environmental allergies.  Neurological:  Negative for dizziness and headaches.  Psychiatric/Behavioral:  Positive for sleep disturbance.          Past Medical History:  Diagnosis Date   Cerebrovascular accident (CVA) due to thrombosis of left posterior cerebral artery (HCC) 04/23/2016    Hyperlipidemia    Hypertension    Stroke [name])     Social History   Socioeconomic History   Marital status: Married    Spouse name: Not on file   Number of children: Not on file   Years of education: Not on file   Highest education level: Not on file  Occupational History   Not on file  Tobacco Use   Smoking status: Never   Smokeless tobacco: Never  Vaping Use   Vaping status: Never Used  Substance and Sexual Activity   Alcohol use: No    Alcohol/week: 0.0 standard drinks of alcohol   Drug use: No   Sexual activity: Not on file  Other Topics Concern   Not on file  Social History Narrative   Married.   2 children, 4 g.children   Works at [employer], chief of staff.     Enjoys spending time in [place], camping, spending time with family.   Social Drivers of Health   Tobacco Use: Low Risk (12/08/2024)   Patient History    Smoking Tobacco Use: Never    Smokeless Tobacco Use: Never    Passive Exposure: Not on file  Financial Resource Strain: Low Risk (02/01/2022)   Overall Financial Resource Strain (CARDIA)    Difficulty of Paying Living Expenses: Not hard at all  Food Insecurity: No Food Insecurity (02/01/2022)   Hunger Vital Sign    Worried About Running Out of Food in the Last Year: Never true    Ran Out of Food in the Last Year: Never true  Transportation Needs: No Transportation Needs (02/01/2022)   PRAPARE -  Administrator, Civil Service (Medical): No    Lack of Transportation (Non-Medical): No  Physical Activity: Insufficiently Active (02/01/2022)   Exercise Vital Sign    Days of Exercise per Week: 4 days    Minutes of Exercise per Session: 30 min  Stress: No Stress Concern Present (02/01/2022)   [employer] of Occupational Health - Occupational Stress Questionnaire    Feeling of Stress : Only a little  Social Connections: Socially Integrated (02/01/2022)   Social Connection and Isolation Panel    Frequency of Communication with Friends and Family:  More than three times a week    Frequency of Social Gatherings with Friends and Family: More than three times a week    Attends Religious Services: More than 4 times per year    Active Member of [employer] or Organizations: Yes    Attends Engineer, Structural: More than 4 times per year    Marital Status: Married  Catering Manager Violence: Not At Risk (02/01/2022)   Humiliation, Afraid, Rape, and Kick questionnaire    Fear of Current or Ex-Partner: No    Emotionally Abused: No    Physically Abused: No    Sexually Abused: No  Depression (PHQ2-9): Low Risk (05/08/2023)   Depression (PHQ2-9)    PHQ-2 Score: 0  Alcohol Screen: Low Risk (02/01/2022)   Alcohol Screen    Last Alcohol Screening Score (AUDIT): 0  Housing: Low Risk (02/01/2022)   Housing    Last Housing Risk Score: 0  Utilities: Not on file  Health Literacy: Not on file    Past Surgical History:  Procedure Laterality Date   BLADDER SURGERY     Bladder Tack   KNEE ARTHROSCOPY Left    TUBAL LIGATION      Family History  Problem Relation Age of Onset   Stroke Father    Diabetes Father    Hypertension Father    Arrhythmia Mother    Hypertension Mother    Hypertension Brother     Allergies[1]  Medications Ordered Prior to Encounter[2]  BP 138/88 (BP Location: Left Arm, Patient Position: Sitting, Cuff Size: Large)   Pulse 69   Temp 98.2 F (36.8 C) (Temporal)   Ht 5' 5 (1.651 m)   Wt 183 lb (83 kg)   SpO2 98%   BMI 30.45 kg/m  Objective:   Physical Exam HENT:     Right Ear: Tympanic membrane and ear canal normal.     Left Ear: Tympanic membrane and ear canal normal.  Eyes:     Pupils: Pupils are equal, round, and reactive to light.  Cardiovascular:     Rate and Rhythm: Normal rate and regular rhythm.  Pulmonary:     Effort: Pulmonary effort is normal.     Breath sounds: Normal breath sounds.  Abdominal:     General: Bowel sounds are normal.     Palpations: Abdomen is soft.     Tenderness:  There is no abdominal tenderness.  Musculoskeletal:        General: Normal range of motion.     Cervical back: Neck supple.  Skin:    General: Skin is warm and dry.  Neurological:     Mental Status: She is alert and oriented to person, place, and time.     Cranial Nerves: No cranial nerve deficit.     Deep Tendon Reflexes:     Reflex Scores:      Patellar reflexes are 2+ on the right side and 2+  on the left side. Psychiatric:        Mood and Affect: Mood normal.     Physical Exam        Assessment & Plan:  Preventative health care Assessment & Plan: Immunizations UTD.  Mammogram and bone density due, orders placed. Colonoscopy overdue, she continues to decline colonoscopy and cologuard  Discussed the importance of a healthy diet and regular exercise in order for weight loss, and to reduce the risk of further co-morbidity.  Exam stable. Labs reviewed and pending.  Follow up in 1 year for repeat physical.    Essential hypertension Assessment & Plan: Overall stable.  Continue to monitor.   Orders: -     Basic metabolic panel with GFR  Type 2 diabetes mellitus with hyperglycemia, without long-term current use of insulin (HCC) Assessment & Plan: Repeat A1C pending.   Continue off treatment.  Foot exam today.  She will schedule eye exam.  Orders: -     Hemoglobin A1c  Hyperlipidemia, unspecified hyperlipidemia type Assessment & Plan: Controlled.  Reviewed lipid panel from June 2025. Continue rosuvastatin  40 mg daily.    History of CVA (cerebrovascular accident) Assessment & Plan: LDL at goal of <70.  Continue rosuvastatin  40 mg daily Continue aspirin  325 mg daily.   Screening mammogram for breast cancer -     3D Screening Mammogram, Left and Right; Future  Estrogen deficiency -     DG Bone Density; Future  Sleep disturbance Assessment & Plan: Recommended sleep study as symptoms are suspicious for sleep apnea, she kindly declines. She will  try melatonin and update.      Assessment and Plan Assessment & Plan         Comer MARLA Gaskins, NP       [1]  Allergies Allergen Reactions   Codeine Nausea Only and Other (See Comments)    Headaches also   Morphine Nausea Only and Other (See Comments)    Headaches also   Hydrochlorothiazide  Nausea And Vomiting  [2]  Current Outpatient Medications on File Prior to Visit  Medication Sig Dispense Refill   aspirin  325 MG tablet Take 1 tablet (325 mg total) by mouth daily. 60 tablet 0   cholecalciferol  (VITAMIN D ) 1000 units tablet Take 1,000 Units by mouth daily.     diclofenac  Sodium (VOLTAREN ) 1 % GEL Apply 1 gram to skin every 6 to 8 hours as needed for pain 300 g 1   lisinopril  (ZESTRIL ) 10 MG tablet Take 1 tablet (10 mg total) by mouth 2 (two) times daily for blood pressure. 30 tablet 0   Magnesium 200 MG TABS Take 1 tablet by mouth daily.     Multiple Vitamins-Minerals (MULTIVITAMIN GUMMIES ADULTS) CHEW Chew 2 each by mouth daily.     rosuvastatin  (CRESTOR ) 40 MG tablet Take 1 tablet (40 mg total) by mouth daily. for cholesterol. 90 tablet 3   vitamin B-12 (CYANOCOBALAMIN ) 1000 MCG tablet Take 1,000 mcg by mouth daily.     Zinc 25 MG TABS Take 25 mg by mouth daily.     No current facility-administered medications on file prior to visit.   "

## 2024-12-15 ENCOUNTER — Other Ambulatory Visit (HOSPITAL_COMMUNITY): Payer: Self-pay

## 2024-12-15 ENCOUNTER — Other Ambulatory Visit: Payer: Self-pay | Admitting: Primary Care

## 2024-12-15 DIAGNOSIS — I10 Essential (primary) hypertension: Secondary | ICD-10-CM

## 2024-12-15 MED ORDER — LISINOPRIL 10 MG PO TABS
10.0000 mg | ORAL_TABLET | Freq: Two times a day (BID) | ORAL | 2 refills | Status: AC
  Filled 2024-12-15: qty 180, 90d supply, fill #0

## 2024-12-15 NOTE — Telephone Encounter (Unsigned)
 Copied from CRM [id number]. Topic: Clinical - Medication Refill >> Dec 15, 2024 10:07 AM Sophia H wrote: Medication: lisinopril  (ZESTRIL ) 10 MG tablet **Patient is requesting a 90 day supply   Has the patient contacted their pharmacy? Yes, pharmacy advised patient to reach out to PCP for updated RX.   This is the patient's preferred pharmacy:  [place] [facility] [address], [address] [place] [address] Phone: [phone] Fax: [phone]  Is this the correct pharmacy for this prescription? Yes If no, delete pharmacy and type the correct one.   Has the prescription been filled recently? Yes  Is the patient out of the medication? No, patient has 4 left.   Has the patient been seen for an appointment in the last year OR does the patient have an upcoming appointment? Yes, seen Jan 14  Can we respond through MyChart? No, prefers phone call.   Agent: Please be advised that Rx refills may take up to 3 business days. We ask that you follow-up with your pharmacy.
